# Patient Record
Sex: Female | Born: 1955 | Race: Black or African American | Hispanic: No | Marital: Married | Smoking: Never smoker
Health system: Southern US, Community
[De-identification: ages and names within clinical notes are randomized; demographics above are authoritative.]

## PROBLEM LIST (undated history)

## (undated) DIAGNOSIS — Z860100 Personal history of colon polyps, unspecified: Secondary | ICD-10-CM

## (undated) DIAGNOSIS — E669 Obesity, unspecified: Secondary | ICD-10-CM

## (undated) DIAGNOSIS — E559 Vitamin D deficiency, unspecified: Secondary | ICD-10-CM

## (undated) HISTORY — DX: Personal history of colon polyps, unspecified: Z86.0100

## (undated) HISTORY — PX: CHOLECYSTECTOMY: SHX55

## (undated) HISTORY — DX: Personal history of colonic polyps: Z86.010

## (undated) HISTORY — PX: WISDOM TOOTH EXTRACTION: SHX21

---

## 2005-02-12 ENCOUNTER — Other Ambulatory Visit: Payer: Self-pay

## 2005-02-12 ENCOUNTER — Emergency Department: Payer: Self-pay | Admitting: Emergency Medicine

## 2009-02-27 ENCOUNTER — Ambulatory Visit: Payer: Self-pay | Admitting: General Practice

## 2010-05-23 ENCOUNTER — Ambulatory Visit: Payer: Self-pay | Admitting: Gastroenterology

## 2010-05-28 LAB — PATHOLOGY REPORT

## 2011-05-19 ENCOUNTER — Ambulatory Visit: Payer: Self-pay | Admitting: General Practice

## 2011-07-28 HISTORY — PX: BARIATRIC SURGERY: SHX1103

## 2011-09-15 ENCOUNTER — Ambulatory Visit: Payer: Self-pay | Admitting: Specialist

## 2011-10-27 ENCOUNTER — Ambulatory Visit: Payer: Self-pay | Admitting: Specialist

## 2011-10-30 ENCOUNTER — Ambulatory Visit: Payer: Self-pay | Admitting: Specialist

## 2011-11-25 ENCOUNTER — Ambulatory Visit: Payer: Self-pay | Admitting: Specialist

## 2011-12-28 ENCOUNTER — Inpatient Hospital Stay: Payer: Self-pay | Admitting: Specialist

## 2011-12-29 LAB — CBC WITH DIFFERENTIAL/PLATELET
Basophil #: 0 10*3/uL (ref 0.0–0.1)
Basophil %: 0.1 %
HCT: 35.1 % (ref 35.0–47.0)
HGB: 11.6 g/dL — ABNORMAL LOW (ref 12.0–16.0)
Lymphocyte #: 0.7 10*3/uL — ABNORMAL LOW (ref 1.0–3.6)
MCH: 30.1 pg (ref 26.0–34.0)
Monocyte #: 0.6 x10 3/mm (ref 0.2–0.9)
Monocyte %: 6.2 %
Neutrophil #: 7.8 10*3/uL — ABNORMAL HIGH (ref 1.4–6.5)
Neutrophil %: 86.5 %
Platelet: 179 10*3/uL (ref 150–440)
RBC: 3.86 10*6/uL (ref 3.80–5.20)
WBC: 9 10*3/uL (ref 3.6–11.0)

## 2011-12-29 LAB — BASIC METABOLIC PANEL
Anion Gap: 9 (ref 7–16)
BUN: 11 mg/dL (ref 7–18)
Chloride: 108 mmol/L — ABNORMAL HIGH (ref 98–107)
Co2: 24 mmol/L (ref 21–32)
Creatinine: 0.81 mg/dL (ref 0.60–1.30)
EGFR (African American): >60
EGFR (Non-African Amer.): >60
Glucose: 103 mg/dL — ABNORMAL HIGH (ref 65–99)
Potassium: 4.3 mmol/L (ref 3.5–5.1)

## 2011-12-29 LAB — MAGNESIUM: Magnesium: 1.7 mg/dL — ABNORMAL LOW

## 2011-12-29 LAB — ALBUMIN: Albumin: 3.3 g/dL — ABNORMAL LOW (ref 3.4–5.0)

## 2012-01-19 ENCOUNTER — Ambulatory Visit: Payer: Self-pay | Admitting: Specialist

## 2012-01-25 ENCOUNTER — Ambulatory Visit: Payer: Self-pay | Admitting: Specialist

## 2012-12-13 ENCOUNTER — Ambulatory Visit: Payer: Self-pay | Admitting: Gastroenterology

## 2013-04-05 ENCOUNTER — Ambulatory Visit: Payer: Self-pay | Admitting: General Practice

## 2013-04-18 ENCOUNTER — Ambulatory Visit: Payer: Self-pay | Admitting: Oncology

## 2013-04-18 LAB — CBC CANCER CENTER
Basophil #: 0 x10 3/mm (ref 0.0–0.1)
Eosinophil #: 0.2 x10 3/mm (ref 0.0–0.7)
Eosinophil %: 3 %
HCT: 40.5 % (ref 35.0–47.0)
HGB: 13.4 g/dL (ref 12.0–16.0)
Lymphocyte #: 1.7 x10 3/mm (ref 1.0–3.6)
Lymphocyte %: 28.5 %
MCH: 30 pg (ref 26.0–34.0)
MCHC: 33 g/dL (ref 32.0–36.0)
MCV: 91 fL (ref 80–100)
Monocyte #: 0.4 x10 3/mm (ref 0.2–0.9)
Monocyte %: 5.9 %
Neutrophil #: 3.8 x10 3/mm (ref 1.4–6.5)
Neutrophil %: 62.1 %
Platelet: 204 x10 3/mm (ref 150–440)
RBC: 4.46 10*6/uL (ref 3.80–5.20)
RDW: 13.1 % (ref 11.5–14.5)

## 2013-04-18 LAB — IRON AND TIBC
Iron Bind.Cap.(Total): 290 ug/dL (ref 250–450)
Iron Saturation: 40 %
Unbound Iron-Bind.Cap.: 173 ug/dL

## 2013-04-18 LAB — FERRITIN: Ferritin (ARMC): 249 ng/mL (ref 8–388)

## 2013-04-24 ENCOUNTER — Ambulatory Visit: Payer: Self-pay | Admitting: General Practice

## 2013-04-26 ENCOUNTER — Ambulatory Visit: Payer: Self-pay | Admitting: Oncology

## 2013-06-12 ENCOUNTER — Ambulatory Visit: Payer: Self-pay | Admitting: Gastroenterology

## 2014-08-21 ENCOUNTER — Ambulatory Visit: Payer: Self-pay | Admitting: Podiatry

## 2014-11-18 NOTE — Op Note (Signed)
PATIENT NAME:  Sandra Mendez, Sandra Mendez MR#:  [id number] DATE OF BIRTH:  [birth year]  DATE OF PROCEDURE:  12/28/2011  PREOPERATIVE DIAGNOSIS: Previous    POSTOPERATIVE DIAGNOSIS:    PROCEDURES:   1. 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 2. Extensive lysis of adhesions.  SURGEON:  Kreg Shropshire, MD  ASSISTANT:  Lorenda Cahill   CLINICAL HISTORY:  See History and Physical.  DETAILS OF THE PROCEDURE:  The patient was taken to the operating room and placed on the operating table in the supine position.  Appropriate monitors and supplemental oxygen were delivered.  Broad-spectrum IV antibiotics were administered.  Sequential stockings were placed.  The abdomen was prepped and draped in the usual sterile fashion after the smooth induction of general anesthesia.  The abdomen was accessed using a 5-ml optical trocar in the left upper quadrant.  Pneumoperitoneum was established without difficulty.  Multiple other ports were placed in preparation for gastric bypass.  The small bowel was identified at the ligament of Treitz and run for 50 cm distal to that point.  It was then divided using an Echelon white load stapler reinforced with seam guard.  The Harmonic scalpel was used to take down the mesentery to its base.  A 100 cm (Not Dictated) cm Roux limb was then created and a side-to-side jejunojejunostomy was created in the following fashion.  A tacking stitch was used using 2-0 Surgidac suture.  An enterotomy was made in both limbs and the Echelon white load stapler was inserted to its hub, clamped, and fired for its full 6-cm length.  The ensuing defect was closed using interrupted sutures to retract the enterotomy up and stapled across with the Echelon blue-load stapler with good effect.  A baby's butt stitch was placed times one using 2-0 Surgidac suture.  A stay suture was placed anteriorly using a 2-0 Surgidac suture and the mesenteric defect was closed using running 2-0 Surgidac suture.  The omentum was divided then in  the midline to create a path for the Roux limb to be lifted and elevated up to the gastric pouch.  The liver retractor was placed and the liver was retracted anteriorly and cephalad.  The patient was placed in steep reverse Trendelenburg after the Roux limb was tacked to the underside of the stomach using a 2-0 Surgidac suture.  All structures were removed from the stomach.  The pars flaccida was taken down using a Harmonic scalpel to the second gastric vein.  At that point the Echelon gold load reinforced with seam guard was used to fire multiple times creating a small 30 to 45-ml pouch.  Hemostasis was excellent.  The posterior pouch was then freed of all fibrofatty tissue in preparation for anastomosis.  The small bowel Roux limb was then mobilized up into the upper abdomen and tacked to the left side of the pouch with an interrupted 2-0 Surgidac suture.  Enterotomies were made in the gastric pouch and in the small bowel and the Echelon blue load stapler was inserted to 28 mm and clamped.  This was then fired and the ensuing defect was closed using a layer of 2-0 Polysorb suture times one.  A 34 French blunt-tipped bougie was inserted trans-orally down past the anastomosis and the second layer of closure occurred with two running 2-0 Polysorb sutures from either side, creating a two-layer anastomosis.  The bougie was removed and the proximal Roux limb was clamped.  This was submerged underneath saline and endoscope was placed trans-orally and guided all the way  down to the anastomosis without difficulty.  The anastomosis was widely patent and hemostasis was excellent.  No leak was present under high-pressure insufflation with the proximal Roux limb clamp.  The endoscopes were removed and I re-gowned and re-gloved.  We took the omentum and draped that over the top of the anastomosis and sutured it in place using a 2-0 Surgidac suture.  The Peterson's defect was then closed using running 2-0 Surgidac suture. A  drain was placed in the left upper quadrant over the anastomosis of [place].  The liver retractor and trocars were removed without incident.  No bleeding occurred from any of these sites.  The wounds were then closed using 4-0 Vicryl and Dermabond.  The patient tolerated the procedure well, arrived in the recovery room in stable condition, and will be admitted to my care.  ADDENDUM: Over one hour was spent lysing adhesions primarily omentum to the abdominal wall. This was a very difficult case from that standpoint but otherwise everything went fine.   ____________________________ Kreg Shropshire, MD jb:drc D: 01/07/2012 16:29:25 ET T: 01/08/2012 07:07:59 ET JOB#: [id number]  cc: Kreg Shropshire, MD, <Dictator> Wille Glaser Langley Adie MD ELECTRONICALLY SIGNED 01/12/2012 14:39

## 2015-02-15 ENCOUNTER — Ambulatory Visit
Admission: RE | Admit: 2015-02-15 | Discharge: 2015-02-15 | Disposition: A | Discharge status: Home / Self Care | Payer: PRIVATE HEALTH INSURANCE | Source: Ambulatory Visit | Attending: Family | Admitting: Family

## 2015-02-15 ENCOUNTER — Other Ambulatory Visit: Payer: Self-pay | Admitting: Family

## 2015-02-15 DIAGNOSIS — M25512 Pain in left shoulder: Secondary | ICD-10-CM

## 2015-05-29 ENCOUNTER — Other Ambulatory Visit: Payer: Self-pay

## 2015-06-14 ENCOUNTER — Ambulatory Visit: Payer: Self-pay | Admitting: Family Medicine

## 2015-07-04 ENCOUNTER — Ambulatory Visit: Payer: Self-pay | Admitting: Family Medicine

## 2015-08-15 ENCOUNTER — Ambulatory Visit: Payer: Self-pay | Admitting: Physician Assistant

## 2015-08-16 ENCOUNTER — Ambulatory Visit: Payer: Self-pay | Admitting: Physician Assistant

## 2015-08-16 ENCOUNTER — Encounter: Payer: Self-pay | Admitting: Physician Assistant

## 2015-08-16 ENCOUNTER — Other Ambulatory Visit: Payer: Self-pay | Admitting: Physician Assistant

## 2015-08-16 VITALS — BP 119/70 | HR 73 | Temp 98.3°F

## 2015-08-16 DIAGNOSIS — J208 Acute bronchitis due to other specified organisms: Secondary | ICD-10-CM

## 2015-08-16 MED ORDER — PSEUDOEPH-BROMPHEN-DM 30-2-10 MG/5ML PO SYRP: 5.0000 mL | ORAL_SOLUTION | Freq: Four times a day (QID) | ORAL | Status: DC | PRN

## 2015-08-16 MED ORDER — SULFAMETHOXAZOLE-TRIMETHOPRIM 800-160 MG PO TABS: 1.0000 | ORAL_TABLET | Freq: Two times a day (BID) | ORAL | Status: DC

## 2015-08-16 NOTE — Progress Notes (Signed)
S-  Patient c/o productive cough for one week. Last week developed viral URI consisting of nasal congestion, body ache, and chills. Last two days developed greenish productive cough. O-  No acute distress.  HEENT revealed nasal congestion, post nasal drainage, and productive cough.  Neck supple, no adenopathy, Lungs with bilateral Rales, Heart RRR. A- Bronchitis P- Bactrim DS and Bromfed DM. F/U PRN.

## 2015-08-20 DIAGNOSIS — Z86010 Personal history of colonic polyps: Secondary | ICD-10-CM | POA: Insufficient documentation

## 2015-08-28 LAB — FECAL OCCULT BLOOD, GUAIAC: FECAL OCCULT BLD: NEGATIVE

## 2015-10-08 ENCOUNTER — Ambulatory Visit: Payer: Self-pay | Admitting: Physician Assistant

## 2015-10-08 ENCOUNTER — Encounter: Payer: Self-pay | Admitting: Physician Assistant

## 2015-10-08 VITALS — BP 106/70 | HR 68 | Temp 98.4°F

## 2015-10-08 DIAGNOSIS — H538 Other visual disturbances: Secondary | ICD-10-CM

## 2015-10-08 NOTE — Progress Notes (Signed)
S: pt states she has had floaters in both eyes for awhile, now it seems they are in cloudy balls and clustered, vision seems cloudy, no pain, no headache, no dif with blood pressure  O: vitals wnl, nad, perrl eomi, no redness or drainage, peripheral vision intact, nv intact  A: cloudy vision  P: refer to opth

## 2016-01-14 ENCOUNTER — Other Ambulatory Visit: Payer: Self-pay | Admitting: Emergency Medicine

## 2016-01-14 MED ORDER — VITAMIN D (ERGOCALCIFEROL) 1.25 MG (50000 UNIT) PO CAPS: 50000.0000 [IU] | ORAL_CAPSULE | ORAL | Status: DC

## 2016-01-14 NOTE — Telephone Encounter (Signed)
Med refill approved, hx of vit d deficiency

## 2016-01-17 ENCOUNTER — Ambulatory Visit (INDEPENDENT_AMBULATORY_CARE_PROVIDER_SITE_OTHER): Payer: Managed Care, Other (non HMO) | Admitting: Family Medicine

## 2016-01-17 ENCOUNTER — Encounter: Payer: Self-pay | Admitting: Family Medicine

## 2016-01-17 VITALS — BP 121/81 | HR 62 | Temp 99.1°F | Ht 64.0 in | Wt 233.0 lb

## 2016-01-17 DIAGNOSIS — R635 Abnormal weight gain: Secondary | ICD-10-CM | POA: Diagnosis not present

## 2016-01-17 DIAGNOSIS — IMO0002 Reserved for concepts with insufficient information to code with codable children: Secondary | ICD-10-CM

## 2016-01-17 DIAGNOSIS — Z7189 Other specified counseling: Secondary | ICD-10-CM

## 2016-01-17 DIAGNOSIS — Z0000 Encounter for general adult medical examination without abnormal findings: Secondary | ICD-10-CM | POA: Diagnosis not present

## 2016-01-17 DIAGNOSIS — E559 Vitamin D deficiency, unspecified: Secondary | ICD-10-CM | POA: Diagnosis not present

## 2016-01-17 NOTE — Patient Instructions (Signed)
Immunization Information for Foreign Travel Immunizations can protect you from certain diseases. Immunizations can also prevent the spread of certain infections. It is important to see your caregiver or a travel medicine specialist 4-6 weeks before you travel. This allows time for vaccines to take effect. It also provides enough time for you to get vaccines that must be given in a series over a period of days or weeks. Immunizations for travelers include:  Routine vaccines. These vaccines are standard for the people in a country.  Recommended vaccines. These vaccines are recommended before travel to some countries or regions.  Required vaccines. These vaccines are necessary before travel to specific countries or regions. If it is less than 4 weeks before you leave, you should still see your caregiver. You might still benefit from vaccines or medicines. WHAT ARE THE ROUTINE VACCINES? Routine vaccines can protect you from diseases that are common in many parts of the world. Most routine vaccines are given at specific ages during your life. However, routine vaccines also include the annual flu (influenza) vaccine. You should be up to date on your routine immunizations before you travel. Your caregiver will be able to review your vaccine history and determine whether you have had all the routine vaccines. You may be advised to get extra doses or booster vaccines even if you are up to date on the routine vaccines. WHAT ARE THE RECOMMENDED VACCINES? Know your travel schedule when you visit your caregiver. The vaccines recommended before foreign travel will depend on several factors, including:  The country or countries of travel.  Whether you will travel to rural areas.  The length of time you will be traveling.  The season of the year.  Your age.  Your health status.  Your previous immunizations. Vaccine recommendations change over time. Your caregiver can tell you what vaccines are recommended  before your trip. The annual influenza vaccine sometimes differs for the [place] and [place] hemispheres. Unless the annual vaccines are the same in both hemispheres, people with certain chronic medical conditions who are traveling to the other hemisphere shortly before or during the influenza season should also get the other influenza vaccine. The other influenza vaccine should be obtained either before leaving the country or shortly after arrival at the travel site. WHAT ARE THE REQUIRED VACCINES? Vaccines may be required during a current outbreak of an infectious disease in a country or region. Your caregiver will be able to tell you about any current outbreaks and required vaccines. For example, proof of yellow fever immunization is currently required for most people before traveling to certain countries in [place] and [place]. This vaccine can only be obtained at approved centers. You should get the yellow fever vaccine at least 10 days before your trip. After 10 days, most people show immunity to yellow fever. If it has been longer than 10 years since you received the yellow fever vaccine, another dose is required. If proof of immunization is incomplete or inaccurate, you could be quarantined, denied entry, or given another dose of vaccine at the travel site. If you cannot receive the yellow fever vaccine because of medical reasons, you must have a written statement from your caregiver. The statement must contain a medical reason for the lack of immunization. In such a case, your caregiver should then give you advice on how to decrease your chance of getting yellow fever. That advice should include taking precautions to avoid mosquito bites and limiting outdoor time. Other than having a medical condition  or being under the age of 49 months, no other reasons will be accepted for not getting the vaccine.  Proof of meningococcal immunization is required by the [place] for any  person older than 2 years who is taking part in the [place] or [place]. Visas for traveling to the [place] or [place] will not even be issued until there is proof of immunization. You should get this vaccine at least 10 days before your trip. After 10 days, most people show immunity. If it has been longer than 3 years since your last immunization, another dose is required. FOR MORE INFORMATION  Centers for Disease Control and Prevention (CDC): http://www.wolf.info/  World Health Organization [facility]): RoleLink.com.br   This information is not intended to replace advice given to you by your health care provider. Make sure you discuss any questions you have with your health care provider.   Document Released: 07/01/2009 Document Revised: 08/03/2014 Document Reviewed: 06/10/2012 Elsevier Interactive Patient Education [employer].

## 2016-01-17 NOTE — Progress Notes (Signed)
BP 121/81 mmHg  Pulse 62  Temp(Src) 99.1 F (37.3 C)  Ht 5\' 4"  (1.626 m)  Wt 233 lb (105.688 kg)  BMI 39.97 kg/m2  SpO2 99%   Subjective:    Patient ID: Sandra Mendez, female    DOB: July 30, 1955, 60 y.o.   MRN: [id number]  HPI: Sandra Mendez is a 60 y.o. female  Chief Complaint  Patient presents with  . Establish Care   WEIGHT GAIN Duration: 2-3 months  Previous attempts at weight loss: yes Complications of obesity: Now Peak weight: 60 Weight loss goal: 145-150 (starting 200)  Weight loss to date: 12 Requesting obesity pharmacotherapy: no Current weight loss supplements/medications: no Previous weight loss supplements/meds: no Has been walking 3 miles a day Diet has not been good recently  Traveling to [place] in December and [place] in the Spring. Will need vaccines. Unsure if she has had her hepatitis vaccines in the past or not- did work at the hospital in the past.   Relevant past medical, surgical, family and social history reviewed and updated as indicated. Interim medical history since our last visit reviewed. Allergies and medications reviewed and updated.  Review of Systems  Constitutional: Negative.   Respiratory: Negative.   Cardiovascular: Negative.   Gastrointestinal: Negative.   Musculoskeletal: Negative.   Psychiatric/Behavioral: Negative.    Per HPI unless specifically indicated above     Objective:    BP 121/81 mmHg  Pulse 62  Temp(Src) 99.1 F (37.3 C)  Ht 5\' 4"  (1.626 m)  Wt 233 lb (105.688 kg)  BMI 39.97 kg/m2  SpO2 99%  Wt Readings from Last 3 Encounters:  01/17/16 233 lb (105.688 kg)    Physical Exam  Constitutional: She is oriented to person, place, and time. She appears well-developed and well-nourished. No distress.  HENT:  Head: Normocephalic and atraumatic.  Right Ear: Hearing normal.  Left Ear: Hearing normal.  Nose: Nose normal.  Eyes: Conjunctivae and lids are normal. Right eye exhibits no discharge. Left eye  exhibits no discharge. No scleral icterus.  Cardiovascular: Normal rate, regular rhythm, normal heart sounds and intact distal pulses.  Exam reveals no gallop and no friction rub.   No murmur heard. Pulmonary/Chest: Effort normal and breath sounds normal. No respiratory distress. She has no wheezes. She has no rales. She exhibits no tenderness.  Musculoskeletal: Normal range of motion.  Neurological: She is alert and oriented to person, place, and time.  Skin: Skin is warm, dry and intact. No rash noted. No erythema. No pallor.  Psychiatric: She has a normal mood and affect. Her speech is normal and behavior is normal. Judgment and thought content normal. Cognition and memory are normal.  Nursing note and vitals reviewed.      Assessment & Plan:   Problem List Items Addressed This Visit    None    Visit Diagnoses    Weight gain    -  Primary    Will check labs to look for organic cause. Likely due to over eating. Work on cutting back. Await results.     Relevant Orders    CBC with Differential/Platelet    Comprehensive metabolic panel    Lipid Panel w/o Chol/HDL Ratio    TSH    UA/M w/rflx Culture, Routine    Hepatitis, Acute    Hepatitis B surface antibody    VITAMIN D 25 Hydroxy (Vit-D Deficiency, Fractures)    Hgb A1c w/o eAG    Vitamin D deficiency  Will recheck her levels and replace as needed.     Relevant Orders    VITAMIN D 25 Hydroxy (Vit-D Deficiency, Fractures)    Advice or immunization for travel        Will need vaccines for travel to [place]. Referral given today. Checking on hepatitis status with labs.    Relevant Orders    Hepatitis, Acute    Hepatitis B surface antibody    Ambulatory referral to [facility]    Routine general medical examination at a health care facility        Will check labs prior to physical. Physical to be done next visit.     Relevant Orders    CBC with Differential/Platelet    Comprehensive metabolic panel    Lipid Panel w/o  Chol/HDL Ratio    TSH    UA/M w/rflx Culture, Routine    Hepatitis, Acute    Hepatitis B surface antibody    VITAMIN D 25 Hydroxy (Vit-D Deficiency, Fractures)    Hgb A1c w/o eAG        Follow up plan: Return ASAP PE.

## 2016-01-20 ENCOUNTER — Other Ambulatory Visit: Payer: Self-pay

## 2016-01-20 DIAGNOSIS — Z299 Encounter for prophylactic measures, unspecified: Secondary | ICD-10-CM

## 2016-01-20 NOTE — Progress Notes (Signed)
Patient came in to have blood drawn for testing per Dr. Jinny Blossom Johnson's orders.  Blood was drawn from the right arm without any incident.

## 2016-01-20 NOTE — Addendum Note (Signed)
Addended by: Rudene Anda T on: 01/20/2016 12:34 PM   Modules accepted: Orders

## 2016-01-21 LAB — CMP12+LP+TP+TSH+6AC+CBC/D/PLT
ALBUMIN: 4 g/dL (ref 3.6–4.8)
ALT: 21 IU/L (ref 0–32)
AST: 25 IU/L (ref 0–40)
Albumin/Globulin Ratio: 1.7 (ref 1.2–2.2)
Alkaline Phosphatase: 84 IU/L (ref 39–117)
BASOS ABS: 0 10*3/uL (ref 0.0–0.2)
BASOS: 1 %
BUN/Creatinine Ratio: 18 (ref 12–28)
BUN: 15 mg/dL (ref 8–27)
Bilirubin Total: 0.3 mg/dL (ref 0.0–1.2)
CALCIUM: 8.4 mg/dL — AB (ref 8.7–10.3)
CHLORIDE: 105 mmol/L (ref 96–106)
CHOLESTEROL TOTAL: 247 mg/dL — AB (ref 100–199)
Chol/HDL Ratio: 3.3 ratio units (ref 0.0–4.4)
Creatinine, Ser: 0.85 mg/dL (ref 0.57–1.00)
EOS (ABSOLUTE): 0.1 10*3/uL (ref 0.0–0.4)
Eos: 3 %
FREE THYROXINE INDEX: 1.6 (ref 1.2–4.9)
GFR calc Af Amer: 85 mL/min/{1.73_m2} (ref >59)
GFR calc non Af Amer: 74 mL/min/{1.73_m2} (ref >59)
GGT: 20 IU/L (ref 0–60)
Globulin, Total: 2.3 g/dL (ref 1.5–4.5)
Glucose: 94 mg/dL (ref 65–99)
HDL: 74 mg/dL (ref >39)
Hematocrit: 38.1 % (ref 34.0–46.6)
Hemoglobin: 12.6 g/dL (ref 11.1–15.9)
IMMATURE GRANULOCYTES: 0 %
IRON: 113 ug/dL (ref 27–139)
Immature Grans (Abs): 0 10*3/uL (ref 0.0–0.1)
LDH: 207 IU/L (ref 119–226)
LDL Calculated: 156 mg/dL — ABNORMAL HIGH (ref 0–99)
LYMPHS ABS: 1.4 10*3/uL (ref 0.7–3.1)
Lymphs: 37 %
MCH: 30.2 pg (ref 26.6–33.0)
MCHC: 33.1 g/dL (ref 31.5–35.7)
MCV: 91 fL (ref 79–97)
MONOS ABS: 0.2 10*3/uL (ref 0.1–0.9)
Monocytes: 6 %
NEUTROS PCT: 53 %
Neutrophils Absolute: 2 10*3/uL (ref 1.4–7.0)
PLATELETS: 192 10*3/uL (ref 150–379)
Phosphorus: 3.3 mg/dL (ref 2.5–4.5)
Potassium: 4 mmol/L (ref 3.5–5.2)
RBC: 4.17 x10E6/uL (ref 3.77–5.28)
RDW: 14.2 % (ref 12.3–15.4)
SODIUM: 145 mmol/L — AB (ref 134–144)
T3 UPTAKE RATIO: 26 % (ref 24–39)
T4 TOTAL: 6.1 ug/dL (ref 4.5–12.0)
TOTAL PROTEIN: 6.3 g/dL (ref 6.0–8.5)
TSH: 3.13 u[IU]/mL (ref 0.450–4.500)
Triglycerides: 87 mg/dL (ref 0–149)
Uric Acid: 5.9 mg/dL (ref 2.5–7.1)
VLDL Cholesterol Cal: 17 mg/dL (ref 5–40)
WBC: 3.8 10*3/uL (ref 3.4–10.8)

## 2016-01-21 LAB — HEPATITIS PANEL, ACUTE
HEP B S AG: NEGATIVE
Hep A IgM: NEGATIVE
Hep B C IgM: NEGATIVE
Hep C Virus Ab: 0.9 s/co ratio (ref 0.0–0.9)

## 2016-01-21 LAB — MICROSCOPIC EXAMINATION
BACTERIA UA: NONE SEEN
Casts: NONE SEEN /lpf

## 2016-01-21 LAB — UA/M W/RFLX CULTURE, ROUTINE
BILIRUBIN UA: NEGATIVE
GLUCOSE, UA: NEGATIVE
Ketones, UA: NEGATIVE
Leukocytes, UA: NEGATIVE
Nitrite, UA: NEGATIVE
PROTEIN UA: NEGATIVE
RBC, UA: NEGATIVE
SPEC GRAV UA: 1.026 (ref 1.005–1.030)
UUROB: 0.2 mg/dL (ref 0.2–1.0)
pH, UA: 6 (ref 5.0–7.5)

## 2016-01-21 LAB — VITAMIN D 25 HYDROXY (VIT D DEFICIENCY, FRACTURES): Vit D, 25-Hydroxy: 20.2 ng/mL — ABNORMAL LOW (ref 30.0–100.0)

## 2016-01-21 LAB — HGB A1C W/O EAG: Hgb A1c MFr Bld: 5.7 % — ABNORMAL HIGH (ref 4.8–5.6)

## 2016-01-21 LAB — HEPATITIS B SURFACE ANTIBODY,QUALITATIVE: Hep B Surface Ab, Qual: NONREACTIVE

## 2016-01-22 ENCOUNTER — Other Ambulatory Visit: Payer: Self-pay | Admitting: Family Medicine

## 2016-01-22 ENCOUNTER — Encounter: Payer: Self-pay | Admitting: Family Medicine

## 2016-01-22 DIAGNOSIS — E785 Hyperlipidemia, unspecified: Secondary | ICD-10-CM | POA: Insufficient documentation

## 2016-01-22 DIAGNOSIS — Z23 Encounter for immunization: Secondary | ICD-10-CM | POA: Insufficient documentation

## 2016-01-22 DIAGNOSIS — E559 Vitamin D deficiency, unspecified: Secondary | ICD-10-CM | POA: Insufficient documentation

## 2016-01-30 ENCOUNTER — Encounter: Payer: Self-pay | Admitting: Physician Assistant

## 2016-01-30 ENCOUNTER — Ambulatory Visit: Payer: Self-pay | Admitting: Physician Assistant

## 2016-01-30 VITALS — BP 120/70 | HR 70 | Temp 98.5°F

## 2016-01-30 DIAGNOSIS — M791 Myalgia, unspecified site: Secondary | ICD-10-CM

## 2016-01-30 NOTE — Progress Notes (Signed)
S: c/o occasional cramps in upper thighs after walking 3 miles, only happened 2-3 times over the last month, no swelling or redness in legs, states she has been eating a lot of salty chips lately, no otc vitamins  O: vitals wnl, nad, thighs wnl, no redness, swelling, tenderness, n/v intact  A: muscle pain  P: stretch after walking, otc turmeric, return if worsening

## 2016-02-13 ENCOUNTER — Encounter: Payer: Self-pay | Admitting: Emergency Medicine

## 2016-02-13 ENCOUNTER — Emergency Department: Payer: Managed Care, Other (non HMO)

## 2016-02-13 ENCOUNTER — Emergency Department
Admission: EM | Admit: 2016-02-13 | Discharge: 2016-02-13 | Disposition: A | Discharge status: Home / Self Care | Payer: Managed Care, Other (non HMO) | Attending: Emergency Medicine | Admitting: Emergency Medicine

## 2016-02-13 DIAGNOSIS — T148XXA Other injury of unspecified body region, initial encounter: Secondary | ICD-10-CM

## 2016-02-13 DIAGNOSIS — Y999 Unspecified external cause status: Secondary | ICD-10-CM | POA: Diagnosis not present

## 2016-02-13 DIAGNOSIS — Y9389 Activity, other specified: Secondary | ICD-10-CM | POA: Diagnosis not present

## 2016-02-13 DIAGNOSIS — M25572 Pain in left ankle and joints of left foot: Secondary | ICD-10-CM | POA: Diagnosis present

## 2016-02-13 DIAGNOSIS — Y92410 Unspecified street and highway as the place of occurrence of the external cause: Secondary | ICD-10-CM | POA: Insufficient documentation

## 2016-02-13 DIAGNOSIS — S93402A Sprain of unspecified ligament of left ankle, initial encounter: Secondary | ICD-10-CM | POA: Insufficient documentation

## 2016-02-13 DIAGNOSIS — G8911 Acute pain due to trauma: Secondary | ICD-10-CM

## 2016-02-13 MED ORDER — DIAZEPAM 2 MG PO TABS: 2.0000 mg | ORAL_TABLET | Freq: Three times a day (TID) | ORAL | Status: DC | PRN

## 2016-02-13 MED ORDER — CYCLOBENZAPRINE HCL 10 MG PO TABS
5.0000 mg | ORAL_TABLET | Freq: Once | ORAL | Status: AC
  Administered 2016-02-13: 5 mg via ORAL
  Filled 2016-02-13: qty 1

## 2016-02-13 MED ORDER — NAPROXEN 500 MG PO TABS
500.0000 mg | ORAL_TABLET | Freq: Once | ORAL | Status: AC
  Administered 2016-02-13: 500 mg via ORAL
  Filled 2016-02-13: qty 1

## 2016-02-13 MED ORDER — NAPROXEN 500 MG PO TABS: 500.0000 mg | ORAL_TABLET | Freq: Two times a day (BID) | ORAL | Status: DC
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
## 2016-02-18 ENCOUNTER — Encounter: Payer: Self-pay | Admitting: Family Medicine

## 2016-02-18 ENCOUNTER — Ambulatory Visit (INDEPENDENT_AMBULATORY_CARE_PROVIDER_SITE_OTHER): Payer: Managed Care, Other (non HMO) | Admitting: Family Medicine

## 2016-02-18 VITALS — BP 134/83 | Temp 98.7°F | Ht 65.2 in | Wt 233.0 lb

## 2016-02-18 DIAGNOSIS — Z043 Encounter for examination and observation following other accident: Secondary | ICD-10-CM | POA: Diagnosis not present

## 2016-02-18 DIAGNOSIS — Z041 Encounter for examination and observation following transport accident: Secondary | ICD-10-CM

## 2016-02-18 MED ORDER — DIAZEPAM 2 MG PO TABS: 2.0000 mg | ORAL_TABLET | Freq: Every evening | ORAL | 0 refills | Status: DC | PRN

## 2016-02-18 MED ORDER — NAPROXEN 500 MG PO TABS: 500.0000 mg | ORAL_TABLET | Freq: Two times a day (BID) | ORAL | 0 refills | Status: DC

## 2016-02-18 MED ORDER — CYCLOBENZAPRINE HCL 10 MG PO TABS: 10.0000 mg | ORAL_TABLET | Freq: Three times a day (TID) | ORAL | 0 refills | Status: DC | PRN
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
## 2016-02-18 NOTE — Patient Instructions (Addendum)
Continue naproxen 2x a day Stop the diazepam (strong muscle relaxer) during the day- only at night now Start the cyclobenzaprine (muscle relaxer) during the day as needed (don't take it with the diazepam at night or you'll be too too sleepy)  Chest Contusion A chest contusion is a deep bruise on your chest area. Contusions are the result of an injury that caused bleeding under the skin. A chest contusion may involve bruising of the skin, muscles, or ribs. The contusion may turn blue, purple, or yellow. Minor injuries will give you a painless contusion, but more severe contusions may stay painful and swollen for a few weeks. CAUSES  A contusion is usually caused by a blow, trauma, or direct force to an area of the body. SYMPTOMS   Swelling and redness of the injured area.  Discoloration of the injured area.  Tenderness and soreness of the injured area.  Pain. DIAGNOSIS  The diagnosis can be made by taking a history and performing a physical exam. An X-ray, CT scan, or MRI may be needed to determine if there were any associated injuries, such as broken bones (fractures) or internal injuries. TREATMENT  Often, the best treatment for a chest contusion is resting, icing, and applying cold compresses to the injured area. Deep breathing exercises may be recommended to reduce the risk of pneumonia. Over-the-counter medicines may also be recommended for pain control. HOME CARE INSTRUCTIONS   Put ice on the injured area.  Put ice in a plastic bag.  Place a towel between your skin and the bag.  Leave the ice on for 15-20 minutes, 03-04 times a day.  Only take over-the-counter or prescription medicines as directed by your caregiver. Your caregiver may recommend avoiding anti-inflammatory medicines (aspirin, ibuprofen, and naproxen) for 48 hours because these medicines may increase bruising.  Rest the injured area.  Perform deep-breathing exercises as directed by your caregiver.  Stop smoking  if you smoke.  Do not lift objects over 5 pounds (2.3 kg) for 3 days or longer if recommended by your caregiver. SEEK IMMEDIATE MEDICAL CARE IF:   You have increased bruising or swelling.  You have pain that is getting worse.  You have difficulty breathing.  You have dizziness, weakness, or fainting.  You have blood in your urine or stool.  You cough up or vomit blood.  Your swelling or pain is not relieved with medicines. MAKE SURE YOU:   Understand these instructions.  Will watch your condition.  Will get help right away if you are not doing well or get worse.   This information is not intended to replace advice given to you by your health care provider. Make sure you discuss any questions you have with your health care provider.   Document Released: 04/07/2001 Document Revised: 04/06/2012 Document Reviewed: 01/04/2012 Elsevier Interactive Patient Education 2016 [place] A contusion is a deep bruise. Contusions are the result of a blunt injury to tissues and muscle fibers under the skin. The injury causes bleeding under the skin. The skin overlying the contusion may turn blue, purple, or yellow. Minor injuries will give you a painless contusion, but more severe contusions may stay painful and swollen for a few weeks.  CAUSES  This condition is usually caused by a blow, trauma, or direct force to an area of the body. SYMPTOMS  Symptoms of this condition include:  Swelling of the injured area.  Pain and tenderness in the injured area.  Discoloration. The area may have redness  and then turn blue, purple, or yellow. DIAGNOSIS  This condition is diagnosed based on a physical exam and medical history. An X-ray, CT scan, or MRI may be needed to determine if there are any associated injuries, such as broken bones (fractures). TREATMENT  Specific treatment for this condition depends on what area of the body was injured. In general, the best treatment for a  contusion is resting, icing, applying pressure to (compression), and elevating the injured area. This is often called the RICE strategy. Over-the-counter anti-inflammatory medicines may also be recommended for pain control.  HOME CARE INSTRUCTIONS   Rest the injured area.  If directed, apply ice to the injured area:  Put ice in a plastic bag.  Place a towel between your skin and the bag.  Leave the ice on for 20 minutes, 2-3 times per day.  If directed, apply light compression to the injured area using an elastic bandage. Make sure the bandage is not wrapped too tightly. Remove and reapply the bandage as directed by your health care provider.  If possible, raise (elevate) the injured area above the level of your heart while you are sitting or lying down.  Take over-the-counter and prescription medicines only as told by your health care provider. SEEK MEDICAL CARE IF:  Your symptoms do not improve after several days of treatment.  Your symptoms get worse.  You have difficulty moving the injured area. SEEK IMMEDIATE MEDICAL CARE IF:   You have severe pain.  You have numbness in a hand or foot.  Your hand or foot turns pale or cold.   This information is not intended to replace advice given to you by your health care provider. Make sure you discuss any questions you have with your health care provider.   Document Released: 04/22/2005 Document Revised: 04/03/2015 Document Reviewed: 11/28/2014 Elsevier Interactive Patient Education 2016 [employer]  Cervical Sprain A cervical sprain is an injury in the neck in which the strong, fibrous tissues (ligaments) that connect your neck bones stretch or tear. Cervical sprains can range from mild to severe. Severe cervical sprains can cause the neck vertebrae to be unstable. This can lead to damage of the spinal cord and can result in serious nervous system problems. The amount of time it takes for a cervical sprain to get better depends  on the cause and extent of the injury. Most cervical sprains heal in 1 to 3 weeks. CAUSES  Severe cervical sprains may be caused by:   Contact sport injuries (such as from football, rugby, wrestling, hockey, auto racing, gymnastics, diving, martial arts, or boxing).   Motor vehicle collisions.   Whiplash injuries. This is an injury from a sudden forward and backward whipping movement of the head and neck.  Falls.  Mild cervical sprains may be caused by:   Being in an awkward position, such as while cradling a telephone between your ear and shoulder.   Sitting in a chair that does not offer proper support.   Working at a poorly [employer] station.   Looking up or down for long periods of time.  SYMPTOMS   Pain, soreness, stiffness, or a burning sensation in the front, back, or sides of the neck. This discomfort may develop immediately after the injury or slowly, 24 hours or more after the injury.   Pain or tenderness directly in the middle of the back of the neck.   Shoulder or upper back pain.   Limited ability to move the neck.  Headache.   Dizziness.   Weakness, numbness, or tingling in the hands or arms.   Muscle spasms.   Difficulty swallowing or chewing.   Tenderness and swelling of the neck.  DIAGNOSIS  Most of the time your health care provider can diagnose a cervical sprain by taking your history and doing a physical exam. Your health care provider will ask about previous neck injuries and any known neck problems, such as arthritis in the neck. X-rays may be taken to find out if there are any other problems, such as with the bones of the neck. Other tests, such as a CT scan or MRI, may also be needed.  TREATMENT  Treatment depends on the severity of the cervical sprain. Mild sprains can be treated with rest, keeping the neck in place (immobilization), and pain medicines. Severe cervical sprains are immediately immobilized. Further  treatment is done to help with pain, muscle spasms, and other symptoms and may include:  Medicines, such as pain relievers, numbing medicines, or muscle relaxants.   Physical therapy. This may involve stretching exercises, strengthening exercises, and posture training. Exercises and improved posture can help stabilize the neck, strengthen muscles, and help stop symptoms from returning.  HOME CARE INSTRUCTIONS   Put ice on the injured area.   Put ice in a plastic bag.   Place a towel between your skin and the bag.   Leave the ice on for 15-20 minutes, 3-4 times a day.   If your injury was severe, you may have been given a cervical collar to wear. A cervical collar is a two-piece collar designed to keep your neck from moving while it heals.  Do not remove the collar unless instructed by your health care provider.  If you have long hair, keep it outside of the collar.  Ask your health care provider before making any adjustments to your collar. Minor adjustments may be required over time to improve comfort and reduce pressure on your chin or on the back of your head.  Ifyou are allowed to remove the collar for cleaning or bathing, follow your health care provider's instructions on how to do so safely.  Keep your collar clean by wiping it with mild soap and water and drying it completely. If the collar you have been given includes removable pads, remove them every 1-2 days and hand wash them with soap and water. Allow them to air dry. They should be completely dry before you wear them in the collar.  If you are allowed to remove the collar for cleaning and bathing, wash and dry the skin of your neck. Check your skin for irritation or sores. If you see any, tell your health care provider.  Do not drive while wearing the collar.   Only take over-the-counter or prescription medicines for pain, discomfort, or fever as directed by your health care provider.   Keep all follow-up  appointments as directed by your health care provider.   Keep all physical therapy appointments as directed by your health care provider.   Make any needed adjustments to your workstation to promote good posture.   Avoid positions and activities that make your symptoms worse.   Warm up and stretch before being active to help prevent problems.  SEEK MEDICAL CARE IF:   Your pain is not controlled with medicine.   You are unable to decrease your pain medicine over time as planned.   Your activity level is not improving as expected.  SEEK IMMEDIATE MEDICAL CARE IF:  You develop any bleeding.  You develop stomach upset.  You have signs of an allergic reaction to your medicine.   Your symptoms get worse.   You develop new, unexplained symptoms.   You have numbness, tingling, weakness, or paralysis in any part of your body.  MAKE SURE YOU:   Understand these instructions.  Will watch your condition.  Will get help right away if you are not doing well or get worse.   This information is not intended to replace advice given to you by your health care provider. Make sure you discuss any questions you have with your health care provider.   Document Released: 05/10/2007 Document Revised: 07/18/2013 Document Reviewed: 01/18/2013 Elsevier Interactive Patient Education 2016 [place] Strain With Rehab A strain is an injury in which a tendon or muscle is torn. The muscles and tendons of the lower back are vulnerable to strains. However, these muscles and tendons are very strong and require a great force to be injured. Strains are classified into three categories. Grade 1 strains cause pain, but the tendon is not lengthened. Grade 2 strains include a lengthened ligament, due to the ligament being stretched or partially ruptured. With grade 2 strains there is still function, although the function may be decreased. Grade 3 strains involve a complete tear of the  tendon or muscle, and function is usually impaired. SYMPTOMS   Pain in the lower back.  Pain that affects one side more than the other.  Pain that gets worse with movement and may be felt in the hip, buttocks, or back of the thigh.  Muscle spasms of the muscles in the back.  Swelling along the muscles of the back.  Loss of strength of the back muscles.  Crackling sound (crepitation) when the muscles are touched. CAUSES  Lower back strains occur when a force is placed on the muscles or tendons that is greater than they can handle. Common causes of injury include:  Prolonged overuse of the muscle-tendon units in the lower back, usually from incorrect posture.  A single violent injury or force applied to the back. RISK INCREASES WITH:  Sports that involve twisting forces on the spine or a lot of bending at the waist (football, rugby, weightlifting, bowling, golf, tennis, speed skating, racquetball, swimming, running, gymnastics, diving).  Poor strength and flexibility.  Failure to warm up properly before activity.  Family history of lower back pain or disk disorders.  Previous back injury or surgery (especially fusion).  Poor posture with lifting, especially heavy objects.  Prolonged sitting, especially with poor posture. PREVENTION   Learn and use proper posture when sitting or lifting (maintain proper posture when sitting, lift using the knees and legs, not at the waist).  Warm up and stretch properly before activity.  Allow for adequate recovery between workouts.  Maintain physical fitness:  Strength, flexibility, and endurance.  Cardiovascular fitness. PROGNOSIS  If treated properly, lower back strains usually heal within 6 weeks. RELATED COMPLICATIONS   Recurring symptoms, resulting in a chronic problem.  Chronic inflammation, scarring, and partial muscle-tendon tear.  Delayed healing or resolution of symptoms.  Prolonged disability. TREATMENT    Treatment first involves the use of ice and medicine, to reduce pain and inflammation. The use of strengthening and stretching exercises may help reduce pain with activity. These exercises may be performed at home or with a therapist. Severe injuries may require referral to a therapist for further evaluation and treatment, such as ultrasound. Your caregiver may  advise that you wear a back brace or corset, to help reduce pain and discomfort. Often, prolonged bed rest results in greater harm then benefit. Corticosteroid injections may be recommended. However, these should be reserved for the most serious cases. It is important to avoid using your back when lifting objects. At night, sleep on your back on a firm mattress with a pillow placed under your knees. If non-surgical treatment is unsuccessful, surgery may be needed.  MEDICATION   If pain medicine is needed, nonsteroidal anti-inflammatory medicines (aspirin and ibuprofen), or other minor pain relievers (acetaminophen), are often advised.  Do not take pain medicine for 7 days before surgery.  Prescription pain relievers may be given, if your caregiver thinks they are needed. Use only as directed and only as much as you need.  Ointments applied to the skin may be helpful.  Corticosteroid injections may be given by your caregiver. These injections should be reserved for the most serious cases, because they may only be given a certain number of times. HEAT AND COLD  Cold treatment (icing) should be applied for 10 to 15 minutes every 2 to 3 hours for inflammation and pain, and immediately after activity that aggravates your symptoms. Use ice packs or an ice massage.  Heat treatment may be used before performing stretching and strengthening activities prescribed by your caregiver, physical therapist, or athletic trainer. Use a heat pack or a warm water soak. SEEK MEDICAL CARE IF:   Symptoms get worse or do not improve in 2 to 4 weeks, despite  treatment.  You develop numbness, weakness, or loss of bowel or bladder function.  New, unexplained symptoms develop. (Drugs used in treatment may produce side effects.) EXERCISES  RANGE OF MOTION (ROM) AND STRETCHING EXERCISES - Low Back Strain Most people with lower back pain will find that their symptoms get worse with excessive bending forward (flexion) or arching at the lower back (extension). The exercises which will help resolve your symptoms will focus on the opposite motion.  Your physician, physical therapist or athletic trainer will help you determine which exercises will be most helpful to resolve your lower back pain. Do not complete any exercises without first consulting with your caregiver. Discontinue any exercises which make your symptoms worse until you speak to your caregiver.  If you have pain, numbness or tingling which travels down into your buttocks, leg or foot, the goal of the therapy is for these symptoms to move closer to your back and eventually resolve. Sometimes, these leg symptoms will get better, but your lower back pain may worsen. This is typically an indication of progress in your rehabilitation. Be very alert to any changes in your symptoms and the activities in which you participated in the 24 hours prior to the change. Sharing this information with your caregiver will allow him/her to most efficiently treat your condition.  These exercises may help you when beginning to rehabilitate your injury. Your symptoms may resolve with or without further involvement from your physician, physical therapist or athletic trainer. While completing these exercises, remember:  Restoring tissue flexibility helps normal motion to return to the joints. This allows healthier, less painful movement and activity.  An effective stretch should be held for at least 30 seconds.  A stretch should never be painful. You should only feel a gentle lengthening or release in the stretched  tissue. FLEXION RANGE OF MOTION AND STRETCHING EXERCISES: STRETCH - Flexion, Single Knee to Chest   Lie on a firm bed or floor  with both legs extended in front of you.  Keeping one leg in contact with the floor, bring your opposite knee to your chest. Hold your leg in place by either grabbing behind your thigh or at your knee.  Pull until you feel a gentle stretch in your lower back. Hold __________ seconds.  Slowly release your grasp and repeat the exercise with the opposite side. Repeat __________ times. Complete this exercise __________ times per day.  STRETCH - Flexion, Double Knee to Chest   Lie on a firm bed or floor with both legs extended in front of you.  Keeping one leg in contact with the floor, bring your opposite knee to your chest.  Tense your stomach muscles to support your back and then lift your other knee to your chest. Hold your legs in place by either grabbing behind your thighs or at your knees.  Pull both knees toward your chest until you feel a gentle stretch in your lower back. Hold __________ seconds.  Tense your stomach muscles and slowly return one leg at a time to the floor. Repeat __________ times. Complete this exercise __________ times per day.  STRETCH - Low Trunk Rotation  Lie on a firm bed or floor. Keeping your legs in front of you, bend your knees so they are both pointed toward the ceiling and your feet are flat on the floor.  Extend your arms out to the side. This will stabilize your upper body by keeping your shoulders in contact with the floor.  Gently and slowly drop both knees together to one side until you feel a gentle stretch in your lower back. Hold for __________ seconds.  Tense your stomach muscles to support your lower back as you bring your knees back to the starting position. Repeat the exercise to the other side. Repeat __________ times. Complete this exercise __________ times per day  EXTENSION RANGE OF MOTION AND FLEXIBILITY  EXERCISES: STRETCH - Extension, Prone on Elbows   Lie on your stomach on the floor, a bed will be too soft. Place your palms about shoulder width apart and at the height of your head.  Place your elbows under your shoulders. If this is too painful, stack pillows under your chest.  Allow your body to relax so that your hips drop lower and make contact more completely with the floor.  Hold this position for __________ seconds.  Slowly return to lying flat on the floor. Repeat __________ times. Complete this exercise __________ times per day.  RANGE OF MOTION - Extension, Prone Press Ups  Lie on your stomach on the floor, a bed will be too soft. Place your palms about shoulder width apart and at the height of your head.  Keeping your back as relaxed as possible, slowly straighten your elbows while keeping your hips on the floor. You may adjust the placement of your hands to maximize your comfort. As you gain motion, your hands will come more underneath your shoulders.  Hold this position __________ seconds.  Slowly return to lying flat on the floor. Repeat __________ times. Complete this exercise __________ times per day.  RANGE OF MOTION- Quadruped, Neutral Spine   Assume a hands and knees position on a firm surface. Keep your hands under your shoulders and your knees under your hips. You may place padding under your knees for comfort.  Drop your head and point your tail bone toward the ground below you. This will round out your lower back like an angry cat. Hold  this position for __________ seconds.  Slowly lift your head and release your tail bone so that your back sags into a large arch, like an old horse.  Hold this position for __________ seconds.  Repeat this until you feel limber in your lower back.  Now, find your "sweet spot." This will be the most comfortable position somewhere between the two previous positions. This is your neutral spine. Once you have found this  position, tense your stomach muscles to support your lower back.  Hold this position for __________ seconds. Repeat __________ times. Complete this exercise __________ times per day.  STRENGTHENING EXERCISES - Low Back Strain These exercises may help you when beginning to rehabilitate your injury. These exercises should be done near your "sweet spot." This is the neutral, low-back arch, somewhere between fully rounded and fully arched, that is your least painful position. When performed in this safe range of motion, these exercises can be used for people who have either a flexion or extension based injury. These exercises may resolve your symptoms with or without further involvement from your physician, physical therapist or athletic trainer. While completing these exercises, remember:   Muscles can gain both the endurance and the strength needed for everyday activities through controlled exercises.  Complete these exercises as instructed by your physician, physical therapist or athletic trainer. Increase the resistance and repetitions only as guided.  You may experience muscle soreness or fatigue, but the pain or discomfort you are trying to eliminate should never worsen during these exercises. If this pain does worsen, stop and make certain you are following the directions exactly. If the pain is still present after adjustments, discontinue the exercise until you can discuss the trouble with your caregiver. STRENGTHENING - Deep Abdominals, Pelvic Tilt  Lie on a firm bed or floor. Keeping your legs in front of you, bend your knees so they are both pointed toward the ceiling and your feet are flat on the floor.  Tense your lower abdominal muscles to press your lower back into the floor. This motion will rotate your pelvis so that your tail bone is scooping upwards rather than pointing at your feet or into the floor.  With a gentle tension and even breathing, hold this position for __________  seconds. Repeat __________ times. Complete this exercise __________ times per day.  STRENGTHENING - Abdominals, Crunches   Lie on a firm bed or floor. Keeping your legs in front of you, bend your knees so they are both pointed toward the ceiling and your feet are flat on the floor. Cross your arms over your chest.  Slightly tip your chin down without bending your neck.  Tense your abdominals and slowly lift your trunk high enough to just clear your shoulder blades. Lifting higher can put excessive stress on the lower back and does not further strengthen your abdominal muscles.  Control your return to the starting position. Repeat __________ times. Complete this exercise __________ times per day.  STRENGTHENING - Quadruped, Opposite UE/LE Lift   Assume a hands and knees position on a firm surface. Keep your hands under your shoulders and your knees under your hips. You may place padding under your knees for comfort.  Find your neutral spine and gently tense your abdominal muscles so that you can maintain this position. Your shoulders and hips should form a rectangle that is parallel with the floor and is not twisted.  Keeping your trunk steady, lift your right hand no higher than your shoulder and then  your left leg no higher than your hip. Make sure you are not holding your breath. Hold this position __________ seconds.  Continuing to keep your abdominal muscles tense and your back steady, slowly return to your starting position. Repeat with the opposite arm and leg. Repeat __________ times. Complete this exercise __________ times per day.  STRENGTHENING - Lower Abdominals, Double Knee Lift  Lie on a firm bed or floor. Keeping your legs in front of you, bend your knees so they are both pointed toward the ceiling and your feet are flat on the floor.  Tense your abdominal muscles to brace your lower back and slowly lift both of your knees until they come over your hips. Be certain not to hold  your breath.  Hold __________ seconds. Using your abdominal muscles, return to the starting position in a slow and controlled manner. Repeat __________ times. Complete this exercise __________ times per day.  POSTURE AND BODY MECHANICS CONSIDERATIONS - Low Back Strain Keeping correct posture when sitting, standing or completing your activities will reduce the stress put on different body tissues, allowing injured tissues a chance to heal and limiting painful experiences. The following are general guidelines for improved posture. Your physician or physical therapist will provide you with any instructions specific to your needs. While reading these guidelines, remember:  The exercises prescribed by your provider will help you have the flexibility and strength to maintain correct postures.  The correct posture provides the best environment for your joints to work. All of your joints have less wear and tear when properly supported by a spine with good posture. This means you will experience a healthier, less painful body.  Correct posture must be practiced with all of your activities, especially prolonged sitting and standing. Correct posture is as important when doing repetitive low-stress activities (typing) as it is when doing a single heavy-load activity (lifting). RESTING POSITIONS Consider which positions are most painful for you when choosing a resting position. If you have pain with flexion-based activities (sitting, bending, stooping, squatting), choose a position that allows you to rest in a less flexed posture. You would want to avoid curling into a fetal position on your side. If your pain worsens with extension-based activities (prolonged standing, working overhead), avoid resting in an extended position such as sleeping on your stomach. Most people will find more comfort when they rest with their spine in a more neutral position, neither too rounded nor too arched. Lying on a non-sagging bed  on your side with a pillow between your knees, or on your back with a pillow under your knees will often provide some relief. Keep in mind, being in any one position for a prolonged period of time, no matter how correct your posture, can still lead to stiffness. PROPER SITTING POSTURE In order to minimize stress and discomfort on your spine, you must sit with correct posture. Sitting with good posture should be effortless for a healthy body. Returning to good posture is a gradual process. Many people can work toward this most comfortably by using various supports until they have the flexibility and strength to maintain this posture on their own. When sitting with proper posture, your ears will fall over your shoulders and your shoulders will fall over your hips. You should use the back of the chair to support your upper back. Your lower back will be in a neutral position, just slightly arched. You may place a small pillow or folded towel at the base of your lower  back for support.  When working at a desk, create an environment that supports good, upright posture. Without extra support, muscles tire, which leads to excessive strain on joints and other tissues. Keep these recommendations in mind: CHAIR:  A chair should be able to slide under your desk when your back makes contact with the back of the chair. This allows you to work closely.  The chair's height should allow your eyes to be level with the upper part of your monitor and your hands to be slightly lower than your elbows. BODY POSITION  Your feet should make contact with the floor. If this is not possible, use a foot rest.  Keep your ears over your shoulders. This will reduce stress on your neck and lower back. INCORRECT SITTING POSTURES  If you are feeling tired and unable to assume a healthy sitting posture, do not slouch or slump. This puts excessive strain on your back tissues, causing more damage and pain. Healthier options  include:  Using more support, like a lumbar pillow.  Switching tasks to something that requires you to be upright or walking.  Talking a brief walk.  Lying down to rest in a neutral-spine position. PROLONGED STANDING WHILE SLIGHTLY LEANING FORWARD  When completing a task that requires you to lean forward while standing in one place for a long time, place either foot up on a stationary 2-4 inch high object to help maintain the best posture. When both feet are on the ground, the lower back tends to lose its slight inward curve. If this curve flattens (or becomes too large), then the back and your other joints will experience too much stress, tire more quickly, and can cause pain. CORRECT STANDING POSTURES Proper standing posture should be assumed with all daily activities, even if they only take a few moments, like when brushing your teeth. As in sitting, your ears should fall over your shoulders and your shoulders should fall over your hips. You should keep a slight tension in your abdominal muscles to brace your spine. Your tailbone should point down to the ground, not behind your body, resulting in an over-extended swayback posture.  INCORRECT STANDING POSTURES  Common incorrect standing postures include a forward head, locked knees and/or an excessive swayback. WALKING Walk with an upright posture. Your ears, shoulders and hips should all line-up. PROLONGED ACTIVITY IN A FLEXED POSITION When completing a task that requires you to bend forward at your waist or lean over a low surface, try to find a way to stabilize 3 out of 4 of your limbs. You can place a hand or elbow on your thigh or rest a knee on the surface you are reaching across. This will provide you more stability so that your muscles do not fatigue as quickly. By keeping your knees relaxed, or slightly bent, you will also reduce stress across your lower back. CORRECT LIFTING TECHNIQUES DO :   Assume a wide stance. This will provide  you more stability and the opportunity to get as close as possible to the object which you are lifting.  Tense your abdominals to brace your spine. Bend at the knees and hips. Keeping your back locked in a neutral-spine position, lift using your leg muscles. Lift with your legs, keeping your back straight.  Test the weight of unknown objects before attempting to lift them.  Try to keep your elbows locked down at your sides in order get the best strength from your shoulders when carrying an object.  Always  ask for help when lifting heavy or awkward objects. INCORRECT LIFTING TECHNIQUES DO NOT:   Lock your knees when lifting, even if it is a small object.  Bend and twist. Pivot at your feet or move your feet when needing to change directions.  Assume that you can safely pick up even a paper clip without proper posture.   This information is not intended to replace advice given to you by your health care provider. Make sure you discuss any questions you have with your health care provider.   Document Released: 07/13/2005 Document Revised: 08/03/2014 Document Reviewed: 10/25/2008 Elsevier Interactive Patient Education [employer].

## 2016-02-26 ENCOUNTER — Telehealth: Payer: Self-pay | Admitting: Family Medicine

## 2016-02-26 ENCOUNTER — Encounter: Payer: Self-pay | Admitting: Family Medicine

## 2016-02-26 DIAGNOSIS — Z9884 Bariatric surgery status: Secondary | ICD-10-CM | POA: Insufficient documentation

## 2016-02-26 DIAGNOSIS — R7301 Impaired fasting glucose: Secondary | ICD-10-CM | POA: Insufficient documentation

## 2016-02-26 NOTE — Telephone Encounter (Signed)
Pt called stated she is concerned about knots in her stomach stated they are hard as a rock, pt also mentions concerns with tightness and pain in her knees. Please call pt and follow up ASAP. Thanks.

## 2016-02-27 ENCOUNTER — Ambulatory Visit (INDEPENDENT_AMBULATORY_CARE_PROVIDER_SITE_OTHER): Payer: Managed Care, Other (non HMO) | Admitting: Family Medicine

## 2016-02-27 ENCOUNTER — Encounter: Payer: Self-pay | Admitting: Family Medicine

## 2016-02-27 VITALS — BP 145/98 | HR 69 | Temp 98.4°F | Wt 235.0 lb

## 2016-02-27 DIAGNOSIS — M25562 Pain in left knee: Secondary | ICD-10-CM

## 2016-02-27 DIAGNOSIS — T148 Other injury of unspecified body region: Secondary | ICD-10-CM

## 2016-02-27 DIAGNOSIS — T148XXA Other injury of unspecified body region, initial encounter: Secondary | ICD-10-CM
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
## 2016-02-27 NOTE — Telephone Encounter (Signed)
Left message for patient to call back and schedule an appointment so that these issues can be evaluated by Dr.Johnson.

## 2016-03-05 ENCOUNTER — Ambulatory Visit: Payer: Managed Care, Other (non HMO) | Admitting: Family Medicine

## 2016-03-12 ENCOUNTER — Telehealth: Payer: Self-pay | Admitting: Family Medicine

## 2016-03-12 NOTE — Telephone Encounter (Signed)
Pt called and stated that [employer] doesn't bill third party and wondered If she could just go to a chiropractor. After speaking with Dr Wynetta Emery I told the pt it was ok and she could go to the chiropractor if she wanted to but it was different from physical therapy. Dr Wynetta Emery suggested the pt contact [facility] in [place] and she stated that she will call back and let me know what she was able to get accomplished.

## 2016-03-12 NOTE — Telephone Encounter (Signed)
Most PT offices do require payment at the time of service, especially because it's PT.   Dr. Wynetta Emery can try [facility], that's the only place I can think of that might would help her.

## 2016-03-12 NOTE — Telephone Encounter (Signed)
Keri: Can you find out what PT will do this?  Please notify Dr.Johnson when figured out so that she can refer patient to new office

## 2016-03-12 NOTE — Telephone Encounter (Signed)
Patient notified

## 2016-03-12 NOTE — Telephone Encounter (Signed)
I have no idea about any of that. She would have to contact the PT and ask them.

## 2016-03-12 NOTE — Telephone Encounter (Signed)
[facility] requires payment at time of service and the pts attorney told her some places allow you to complete therapy then submit the bills to the insurance company at the end and she wondered if there was another physical therapist around that may possibly do this.

## 2016-03-19 ENCOUNTER — Ambulatory Visit (INDEPENDENT_AMBULATORY_CARE_PROVIDER_SITE_OTHER): Payer: Managed Care, Other (non HMO) | Admitting: Family Medicine

## 2016-03-19 ENCOUNTER — Encounter: Payer: Self-pay | Admitting: Family Medicine

## 2016-03-19 VITALS — BP 133/86 | HR 86 | Temp 99.0°F | Ht 65.2 in | Wt 233.0 lb

## 2016-03-19 DIAGNOSIS — E669 Obesity, unspecified: Secondary | ICD-10-CM

## 2016-03-19 DIAGNOSIS — Z124 Encounter for screening for malignant neoplasm of cervix: Secondary | ICD-10-CM | POA: Diagnosis not present

## 2016-03-19 DIAGNOSIS — Z1239 Encounter for other screening for malignant neoplasm of breast: Secondary | ICD-10-CM | POA: Diagnosis not present

## 2016-03-19 DIAGNOSIS — Z23 Encounter for immunization: Secondary | ICD-10-CM

## 2016-03-19 DIAGNOSIS — Z0000 Encounter for general adult medical examination without abnormal findings: Secondary | ICD-10-CM

## 2016-03-19 NOTE — Patient Instructions (Addendum)
Health Maintenance, Female Adopting a healthy lifestyle and getting preventive care can go a long way to promote health and wellness. Talk with your health care provider about what schedule of regular examinations is right for you. This is a good chance for you to check in with your provider about disease prevention and staying healthy. In between checkups, there are plenty of things you can do on your own. Experts have done a lot of research about which lifestyle changes and preventive measures are most likely to keep you healthy. Ask your health care provider for more information. WEIGHT AND DIET  Eat a healthy diet  Be sure to include plenty of vegetables, fruits, low-fat dairy products, and lean protein.  Do not eat a lot of foods high in solid fats, added sugars, or salt.  Get regular exercise. This is one of the most important things you can do for your health.  Most adults should exercise for at least 150 minutes each week. The exercise should increase your heart rate and make you sweat (moderate-intensity exercise).  Most adults should also do strengthening exercises at least twice a week. This is in addition to the moderate-intensity exercise.  Maintain a healthy weight  Body mass index (BMI) is a measurement that can be used to identify possible weight problems. It estimates body fat based on height and weight. Your health care provider can help determine your BMI and help you achieve or maintain a healthy weight.  For females 20 years of age and older:   A BMI below 18.5 is considered underweight.  A BMI of 18.5 to 24.9 is normal.  A BMI of 25 to 29.9 is considered overweight.  A BMI of 30 and above is considered obese.  Watch levels of cholesterol and blood lipids  You should start having your blood tested for lipids and cholesterol at 62 years of age, then have this test every 5 years.  You may need to have your cholesterol levels checked more often if:  Your lipid  or cholesterol levels are high.  You are older than 62 years of age.  You are at high risk for heart disease.  CANCER SCREENING   Lung Cancer  Lung cancer screening is recommended for adults 55-80 years old who are at high risk for lung cancer because of a history of smoking.  A yearly low-dose CT scan of the lungs is recommended for people who:  Currently smoke.  Have quit within the past 15 years.  Have at least a 30-pack-year history of smoking. A pack year is smoking an average of one pack of cigarettes a day for 1 year.  Yearly screening should continue until it has been 15 years since you quit.  Yearly screening should stop if you develop a health problem that would prevent you from having lung cancer treatment.  Breast Cancer  Practice breast self-awareness. This means understanding how your breasts normally appear and feel.  It also means doing regular breast self-exams. Let your health care provider know about any changes, no matter how small.  If you are in your 20s or 30s, you should have a clinical breast exam (CBE) by a health care provider every 1-3 years as part of a regular health exam.  If you are 40 or older, have a CBE every year. Also consider having a breast X-ray (mammogram) every year.  If you have a family history of breast cancer, talk to your health care provider about genetic screening.  If you   are at high risk for breast cancer, talk to your health care provider about having an MRI and a mammogram every year.  Breast cancer gene (BRCA) assessment is recommended for women who have family members with BRCA-related cancers. BRCA-related cancers include:  Breast.  Ovarian.  Tubal.  Peritoneal cancers.  Results of the assessment will determine the need for genetic counseling and BRCA1 and BRCA2 testing. Cervical Cancer Your health care provider may recommend that you be screened regularly for cancer of the pelvic organs (ovaries, uterus, and  vagina). This screening involves a pelvic examination, including checking for microscopic changes to the surface of your cervix (Pap test). You may be encouraged to have this screening done every 3 years, beginning at age 21.  For women ages 30-65, health care providers may recommend pelvic exams and Pap testing every 3 years, or they may recommend the Pap and pelvic exam, combined with testing for human papilloma virus (HPV), every 5 years. Some types of HPV increase your risk of cervical cancer. Testing for HPV may also be done on women of any age with unclear Pap test results.  Other health care providers may not recommend any screening for nonpregnant women who are considered low risk for pelvic cancer and who do not have symptoms. Ask your health care provider if a screening pelvic exam is right for you.  If you have had past treatment for cervical cancer or a condition that could lead to cancer, you need Pap tests and screening for cancer for at least 20 years after your treatment. If Pap tests have been discontinued, your risk factors (such as having a new sexual partner) need to be reassessed to determine if screening should resume. Some women have medical problems that increase the chance of getting cervical cancer. In these cases, your health care provider may recommend more frequent screening and Pap tests. Colorectal Cancer  This type of cancer can be detected and often prevented.  Routine colorectal cancer screening usually begins at 62 years of age and continues through 62 years of age.  Your health care provider may recommend screening at an earlier age if you have risk factors for colon cancer.  Your health care provider may also recommend using home test kits to check for hidden blood in the stool.  A small camera at the end of a tube can be used to examine your colon directly (sigmoidoscopy or colonoscopy). This is done to check for the earliest forms of colorectal  cancer.  Routine screening usually begins at age 50.  Direct examination of the colon should be repeated every 5-10 years through 62 years of age. However, you may need to be screened more often if early forms of precancerous polyps or small growths are found. Skin Cancer  Check your skin from head to toe regularly.  Tell your health care provider about any new moles or changes in moles, especially if there is a change in a mole's shape or color.  Also tell your health care provider if you have a mole that is larger than the size of a pencil eraser.  Always use sunscreen. Apply sunscreen liberally and repeatedly throughout the day.  Protect yourself by wearing long sleeves, pants, a wide-brimmed hat, and sunglasses whenever you are outside. HEART DISEASE, DIABETES, AND HIGH BLOOD PRESSURE   High blood pressure causes heart disease and increases the risk of stroke. High blood pressure is more likely to develop in:  People who have blood pressure in the high end   of the normal range (130-139/85-89 mm Hg).  People who are overweight or obese.  People who are African American.  If you are 38-23 years of age, have your blood pressure checked every 3-5 years. If you are 61 years of age or older, have your blood pressure checked every year. You should have your blood pressure measured twice--once when you are at a hospital or clinic, and once when you are not at a hospital or clinic. Record the average of the two measurements. To check your blood pressure when you are not at a hospital or clinic, you can use:  An automated blood pressure machine at a pharmacy.  A home blood pressure monitor.  If you are between 45 years and 39 years old, ask your health care provider if you should take aspirin to prevent strokes.  Have regular diabetes screenings. This involves taking a blood sample to check your fasting blood sugar level.  If you are at a normal weight and have a low risk for diabetes,  have this test once every three years after 62 years of age.  If you are overweight and have a high risk for diabetes, consider being tested at a younger age or more often. PREVENTING INFECTION  Hepatitis B  If you have a higher risk for hepatitis B, you should be screened for this virus. You are considered at high risk for hepatitis B if:  You were born in a country where hepatitis B is common. Ask your health care provider which countries are considered high risk.  Your parents were born in a high-risk country, and you have not been immunized against hepatitis B (hepatitis B vaccine).  You have HIV or AIDS.  You use needles to inject street drugs.  You live with someone who has hepatitis B.  You have had sex with someone who has hepatitis B.  You get hemodialysis treatment.  You take certain medicines for conditions, including cancer, organ transplantation, and autoimmune conditions. Hepatitis C  Blood testing is recommended for:  Everyone born from [birth year] through 1967.  Anyone with known risk factors for hepatitis C. Sexually transmitted infections (STIs)  You should be screened for sexually transmitted infections (STIs) including gonorrhea and chlamydia if:  You are sexually active and are younger than 62 years of age.  You are older than 62 years of age and your health care provider tells you that you are at risk for this type of infection.  Your sexual activity has changed since you were last screened and you are at an increased risk for chlamydia or gonorrhea. Ask your health care provider if you are at risk.  If you do not have HIV, but are at risk, it may be recommended that you take a prescription medicine daily to prevent HIV infection. This is called pre-exposure prophylaxis (PrEP). You are considered at risk if:  You are sexually active and do not regularly use condoms or know the HIV status of your partner(s).  You take drugs by injection.  You are sexually  active with a partner who has HIV. Talk with your health care provider about whether you are at high risk of being infected with HIV. If you choose to begin PrEP, you should first be tested for HIV. You should then be tested every 3 months for as long as you are taking PrEP.  PREGNANCY   If you are premenopausal and you may become pregnant, ask your health care provider about preconception counseling.  If you may  PrEP). You are considered at risk if:    You are sexually active and do not regularly use condoms or know the HIV status of your partner(s).    You take drugs by injection.    You are sexually  active with a partner who has HIV.  Talk with your health care provider about whether you are at high risk of being infected with HIV. If you choose to begin PrEP, you should first be tested for HIV. You should then be tested every 3 months for as long as you are taking PrEP.   PREGNANCY   · If you are premenopausal and you may become pregnant, ask your health care provider about preconception counseling.  · If you may become pregnant, take 400 to 800 micrograms (mcg) of folic acid every day.  · If you want to prevent pregnancy, talk to your health care provider about birth control (contraception).  OSTEOPOROSIS AND MENOPAUSE   · Osteoporosis is a disease in which the bones lose minerals and strength with aging. This can result in serious bone fractures. Your risk for osteoporosis can be identified using a bone density scan.  · If you are 65 years of age or older, or if you are at risk for osteoporosis and fractures, ask your health care provider if you should be screened.  · Ask your health care provider whether you should take a calcium or vitamin D supplement to lower your risk for osteoporosis.  · Menopause may have certain physical symptoms and risks.  · Hormone replacement therapy may reduce some of these symptoms and risks.  Talk to your health care provider about whether hormone replacement therapy is right for you.   HOME CARE INSTRUCTIONS   · Schedule regular health, dental, and eye exams.  · Stay current with your immunizations.    · Do not use any tobacco products including cigarettes, chewing tobacco, or electronic cigarettes.  · If you are pregnant, do not drink alcohol.  · If you are breastfeeding, limit how much and how often you drink alcohol.  · Limit alcohol intake to no more than 1 drink per day for nonpregnant women. One drink equals 12 ounces of beer, 5 ounces of wine, or 1½ ounces of hard liquor.  · Do not use street drugs.  · Do not share needles.  · Ask your health care provider for help if  you need support or information about quitting drugs.  · Tell your health care provider if you often feel depressed.  · Tell your health care provider if you have ever been abused or do not feel safe at home.     This information is not intended to replace advice given to you by your health care provider. Make sure you discuss any questions you have with your health care provider.     Document Released: 01/26/2011 Document Revised: 08/03/2014 Document Reviewed: 06/14/2013  Elsevier Interactive Patient Education ©2016 [employer]  Menopause is a normal process in which your reproductive ability comes to an end. This process happens gradually over a span of months to years, usually between the ages of 48 and 55. Menopause is complete when you have missed 12 consecutive menstrual periods.  It is important to talk with your health care provider about some of the most common conditions that affect postmenopausal women, such as heart disease, cancer, and bone loss (osteoporosis). Adopting a healthy lifestyle and getting preventive care can help to promote your health and wellness. Those actions can also   lower your chances of developing some of these common conditions.  WHAT SHOULD I KNOW ABOUT MENOPAUSE?  During menopause, you may experience a number of symptoms, such as:  · Moderate-to-severe hot flashes.  · Night sweats.  · Decrease in sex drive.  · Mood swings.  · Headaches.  · Tiredness.  · Irritability.  · Memory problems.  · Insomnia.  Choosing to treat or not to treat menopausal changes is an individual decision that you make with your health care provider.  WHAT SHOULD I KNOW ABOUT HORMONE REPLACEMENT THERAPY AND SUPPLEMENTS?  Hormone therapy products are effective for treating symptoms that are associated with menopause, such as hot flashes and night sweats. Hormone replacement carries certain risks, especially as you become older. If you are thinking about using estrogen or estrogen with progestin treatments,  discuss the benefits and risks with your health care provider.  WHAT SHOULD I KNOW ABOUT HEART DISEASE AND STROKE?  Heart disease, heart attack, and stroke become more likely as you age. This may be due, in part, to the hormonal changes that your body experiences during menopause. These can affect how your body processes dietary fats, triglycerides, and cholesterol. Heart attack and stroke are both medical emergencies.  There are many things that you can do to help prevent heart disease and stroke:  · Have your blood pressure checked at least every 1-2 years. High blood pressure causes heart disease and increases the risk of stroke.  · If you are 55-79 years old, ask your health care provider if you should take aspirin to prevent a heart attack or a stroke.  · Do not use any tobacco products, including cigarettes, chewing tobacco, or electronic cigarettes. If you need help quitting, ask your health care provider.  · It is important to eat a healthy diet and maintain a healthy weight.    Be sure to include plenty of vegetables, fruits, low-fat dairy products, and lean protein.    Avoid eating foods that are high in solid fats, added sugars, or salt (sodium).  · Get regular exercise. This is one of the most important things that you can do for your health.    Try to exercise for at least 150 minutes each week. The type of exercise that you do should increase your heart rate and make you sweat. This is known as moderate-intensity exercise.    Try to do strengthening exercises at least twice each week. Do these in addition to the moderate-intensity exercise.  · Know your numbers. Ask your health care provider to check your cholesterol and your blood glucose. Continue to have your blood tested as directed by your health care provider.  WHAT SHOULD I KNOW ABOUT CANCER SCREENING?  There are several types of cancer. Take the following steps to reduce your risk and to catch any cancer development as early as  possible.  Breast Cancer  · Practice breast self-awareness.    This means understanding how your breasts normally appear and feel.    It also means doing regular breast self-exams. Let your health care provider know about any changes, no matter how small.  · If you are 40 or older, have a clinician do a breast exam (clinical breast exam or CBE) every year. Depending on your age, family history, and medical history, it may be recommended that you also have a yearly breast X-ray (mammogram).  · If you have a family history of breast cancer, talk with your health care provider about genetic screening.  ·   If you are at high risk for breast cancer, talk with your health care provider about having an MRI and a mammogram every year.  · Breast cancer (BRCA) gene test is recommended for women who have family members with BRCA-related cancers. Results of the assessment will determine the need for genetic counseling and BRCA1 and for BRCA2 testing. BRCA-related cancers include these types:    Breast. This occurs in males or females.    Ovarian.    Tubal. This may also be called fallopian tube cancer.    Cancer of the abdominal or pelvic lining (peritoneal cancer).    Prostate.    Pancreatic.  Cervical, Uterine, and Ovarian Cancer  Your health care provider may recommend that you be screened regularly for cancer of the pelvic organs. These include your ovaries, uterus, and vagina. This screening involves a pelvic exam, which includes checking for microscopic changes to the surface of your cervix (Pap test).  · For women ages 21-65, health care providers may recommend a pelvic exam and a Pap test every three years. For women ages 30-65, they may recommend the Pap test and pelvic exam, combined with testing for human papilloma virus (HPV), every five years. Some types of HPV increase your risk of cervical cancer. Testing for HPV may also be done on women of any age who have unclear Pap test results.  · Other health care providers  may not recommend any screening for nonpregnant women who are considered low risk for pelvic cancer and have no symptoms. Ask your health care provider if a screening pelvic exam is right for you.  · If you have had past treatment for cervical cancer or a condition that could lead to cancer, you need Pap tests and screening for cancer for at least 20 years after your treatment. If Pap tests have been discontinued for you, your risk factors (such as having a new sexual partner) need to be reassessed to determine if you should start having screenings again. Some women have medical problems that increase the chance of getting cervical cancer. In these cases, your health care provider may recommend that you have screening and Pap tests more often.  · If you have a family history of uterine cancer or ovarian cancer, talk with your health care provider about genetic screening.  · If you have vaginal bleeding after reaching menopause, tell your health care provider.  · There are currently no reliable tests available to screen for ovarian cancer.  Lung Cancer  Lung cancer screening is recommended for adults 55-80 years old who are at high risk for lung cancer because of a history of smoking. A yearly low-dose CT scan of the lungs is recommended if you:  · Currently smoke.  · Have a history of at least 30 pack-years of smoking and you currently smoke or have quit within the past 15 years. A pack-year is smoking an average of one pack of cigarettes per day for one year.  Yearly screening should:  · Continue until it has been 15 years since you quit.  · Stop if you develop a health problem that would prevent you from having lung cancer treatment.  Colorectal Cancer  · This type of cancer can be detected and can often be prevented.  · Routine colorectal cancer screening usually begins at age 50 and continues through age 75.  · If you have risk factors for colon cancer, your health care provider may recommend that you be  screened at an earlier age.  ·   wearing long sleeves, pants, a wide-brimmed hat, and sunglasses. WHAT SHOULD I KNOW ABOUT OSTEOPOROSIS? Osteoporosis is a condition in which bone destruction happens more quickly than new bone creation. After menopause, you may be at an increased risk for osteoporosis. To help prevent osteoporosis or the bone fractures that can happen because of osteoporosis, the following is recommended:  If you are 80-83 years old, get at least 1,000 mg of calcium and at least 600 mg of vitamin D per day.  If you are older than age 5 but younger than age 22, get at least 1,200 mg of calcium and at least 600 mg of vitamin D per day.  If you are older than  age 78, get at least 1,200 mg of calcium and at least 800 mg of vitamin D per day. Smoking and excessive alcohol intake increase the risk of osteoporosis. Eat foods that are rich in calcium and vitamin D, and do weight-bearing exercises several times each week as directed by your health care provider. WHAT SHOULD I KNOW ABOUT HOW MENOPAUSE AFFECTS [place]? Depression may occur at any age, but it is more common as you become older. Common symptoms of depression include:  Low or sad mood.  Changes in sleep patterns.  Changes in appetite or eating patterns.  Feeling an overall lack of motivation or enjoyment of activities that you previously enjoyed.  Frequent crying spells. Talk with your health care provider if you think that you are experiencing depression. WHAT SHOULD I KNOW ABOUT IMMUNIZATIONS? It is important that you get and maintain your immunizations. These include:  Tetanus, diphtheria, and pertussis (Tdap) booster vaccine.  Influenza every year before the flu season begins.  Pneumonia vaccine.  Shingles vaccine. Your health care provider may also recommend other immunizations.   This information is not intended to replace advice given to you by your health care provider. Make sure you discuss any questions you have with your health care provider.   Document Released: 09/04/2005 Document Revised: 08/03/2014 Document Reviewed: 03/15/2014 Elsevier Interactive Patient Education 2016 [employer] Hepatitis B Vaccine: What You Need to Know 1. Why get vaccinated? Hepatitis B is a serious disease that affects the liver. It is caused by the hepatitis B virus. Hepatitis B can cause mild illness lasting a few weeks, or it can lead to a serious, lifelong illness. Hepatitis B virus infection can be either acute or chronic. Acute hepatitis B virus infection is a short-term illness that occurs within the first 6 months after someone is exposed to the hepatitis B virus. This can  lead to:  fever, fatigue, loss of appetite, nausea, and/or vomiting  jaundice (yellow skin or eyes, dark urine, clay-colored bowel movements)  pain in muscles, joints, and stomach Chronic hepatitis B virus infection is a long-term illness that occurs when the hepatitis B virus remains in a person's body. Most people who go on to develop chronic hepatitis B do not have symptoms, but it is still very serious and can lead to:  liver damage (cirrhosis)  liver cancer  death Chronically-infected people can spread hepatitis B virus to others, even if they do not feel or look sick themselves. Up to 1.4 million people in the [place] may have chronic hepatitis B infection. About 90% of infants who get hepatitis B become chronically infected and about 1 out of 4 of them dies. Hepatitis B is spread when blood, semen, or other body fluid infected with the Hepatitis B virus enters the body of a  person who is not infected. People can become infected with the virus through:  Birth (a baby whose mother is infected can be infected at or after birth)  Sharing items such as razors or toothbrushes with an infected person  Contact with the blood or open sores of an infected person  Sex with an infected partner  Sharing needles, syringes, or other drug-injection equipment  Exposure to blood from needlesticks or other sharp instruments Each year about 2,000 people in the [place] die from hepatitis B-related liver disease. Hepatitis B vaccine can prevent hepatitis B and its consequences, including liver cancer and cirrhosis. 2. Hepatitis B vaccine Hepatitis B vaccine is made from parts of the hepatitis B virus. It cannot cause hepatitis B infection. The vaccine is usually given as 3 or 4 shots over a 36-month period. Infants should get their first dose of hepatitis B vaccine at birth and will usually complete the series at 65 months of age. All children and adolescents younger than 47 years of age  who have not yet gotten the vaccine should also be vaccinated. Hepatitis B vaccine is recommended for unvaccinated adults who are at risk for hepatitis B virus infection, including:  People whose sex partners have hepatitis B  Sexually active persons who are not in a long-term monogamous relationship  Persons seeking evaluation or treatment for a sexually transmitted disease  Men who have sexual contact with other men  People who share needles, syringes, or other drug-injection equipment  People who have household contact with someone infected with the hepatitis B virus  Health care and public safety workers at risk for exposure to blood or body fluids  Residents and staff of facilities for developmentally disabled persons  Persons in correctional facilities  Victims of sexual assault or abuse  Travelers to regions with increased rates of hepatitis B  People with chronic liver disease, kidney disease, HIV infection, or diabetes  Anyone who wants to be protected from hepatitis B There are no known risks to getting hepatitis B vaccine at the same time as other vaccines. 3. Some people should not get this vaccine Tell the person who is giving the vaccine:  If the person getting the vaccine has any severe, life-threatening allergies. If you ever had a life-threatening allergic reaction after a dose of hepatitis B vaccine, or have a severe allergy to any part of this vaccine, you may be advised not to get vaccinated. Ask your health care provider if you want information about vaccine components.  If the person getting the vaccine is not feeling well. If you have a mild illness, such as a cold, you can probably get the vaccine today. If you are moderately or severely ill, you should probably wait until you recover. Your doctor can advise you. 4. Risks of a vaccine reaction With any medicine, including vaccines, there is a chance of side effects. These are usually mild and go away on  their own, but serious reactions are also possible. Most people who get hepatitis B vaccine do not have any problems with it. Minor problems following hepatitis B vaccine include:  soreness where the shot was given  temperature of 99.75F or higher If these problems occur, they usually begin soon after the shot and last 1 or 2 days. Your doctor can tell you more about these reactions. Other problems that could happen after this vaccine:  People sometimes faint after a medical procedure, including vaccination. Sitting or lying down for about 15 minutes can help  prevent fainting and injuries caused by a fall. Tell your provider if you feel dizzy, or have vision changes or ringing in the ears.  Some people get shoulder pain that can be more severe and longer-lasting than the more routine soreness that can follow injections. This happens very rarely.  Any medication can cause a severe allergic reaction. Such reactions from a vaccine are very rare, estimated at about 1 in a million doses, and would happen within a few minutes to a few hours after the vaccination. As with any medicine, there is a very remote chance of a vaccine causing a serious injury or death. The safety of vaccines is always being monitored. For more information, visit: http://www.aguilar.org/ 5. What if there is a serious problem? What should I look for?  Look for anything that concerns you, such as signs of a severe allergic reaction, very high fever, or unusual behavior. Signs of a severe allergic reaction can include hives, swelling of the face and throat, difficulty breathing, a fast heartbeat, dizziness, and weakness. These would start a few minutes to a few hours after the vaccination. What should I do?  If you think it is a severe allergic reaction or other emergency that can't wait, call 9-1-1 or get to the nearest hospital. Otherwise, call your clinic. Afterward, the reaction should be reported to the Vaccine  Adverse Event Reporting System (VAERS). Your doctor should file this report, or you can do it yourself through the VAERS web site at www.vaers.SamedayNews.es, or by calling 470 487 1346. VAERS does not give medical advice. 6. The National Vaccine Injury Compensation Program The [employer] Vaccine Injury Compensation Program (VICP) is a federal program that was created to compensate people who may have been injured by certain vaccines. Persons who believe they may have been injured by a vaccine can learn about the program and about filing a claim by calling 407-832-6282 or visiting the [place] website at GoldCloset.com.ee. There is a time limit to file a claim for compensation. 7. How can I learn more?  Ask your healthcare provider. He or she can give you the vaccine package insert or suggest other sources of information.  Call your local or state health department.  Contact the Centers for Disease Control and Prevention (CDC):  Call 276-638-6600 (1-800-CDC-INFO) or  Visit CDC's website at http://hunter.com/ CDC Hepatitis B VIS (02/13/2015)   This information is not intended to replace advice given to you by your health care provider. Make sure you discuss any questions you have with your health care provider.   Document Released: 05/07/2006 Document Revised: 04/03/2015 Document Reviewed: 03/02/2015 Elsevier Interactive Patient Education 2016 [place] oral tablets What is this medicine? LORCASERIN (lor ca SER in) is used to promote and maintain weight loss in obese patients. This medicine should be used with a reduced calorie diet and, if appropriate, an exercise program. This medicine may be used for other purposes; ask your health care provider or pharmacist if you have questions. What should I tell my health care provider before I take this medicine? They need to know if you have any of these conditions: -anatomical deformation of the penis, Peyronie's  disease, or history of priapism (painful and prolonged erection) -diabetes -heart disease -history of blood diseases, like sickle cell anemia or leukemia -history of irregular heartbeat -kidney disease -liver disease -suicidal thoughts, plans, or attempt; a previous suicide attempt by you or a family member -an unusual or allergic reaction to lorcaserin, other medicines, foods, dyes, or preservatives -pregnant or  trying to get pregnant -breast-feeding How should I use this medicine? Take this medicine by mouth with a glass of water. Follow the directions on the prescription label. You can take it with or without food. Take your medicine at regular intervals. Do not take it more often than directed. Do not stop taking except on your doctor's advice. Talk to your pediatrician regarding the use of this medicine in children. Special care may be needed. Overdosage: If you think you have taken too much of this medicine contact a poison control center or emergency room at once. NOTE: This medicine is only for you. Do not share this medicine with others. What if I miss a dose? If you miss a dose, take it as soon as you can. If it is almost time for your next dose, take only that dose. Do not take double or extra doses. What may interact with this medicine? -cabergoline -certain medicines for depression, anxiety, or psychotic disturbances -certain medicines for erectile dysfunction -certain medicines for migraine headache like almotriptan, eletriptan, frovatriptan, naratriptan, rizatriptan, sumatriptan, zolmitriptan -dextromethorphan -linezolid -lithium -medicines for diabetes -other weight loss products -tramadol -St. John's Wort -stimulant medicines for attention disorders, weight loss, or to stay awake -tryptophan This list may not describe all possible interactions. Give your health care provider a list of all the medicines, herbs, non-prescription drugs, or dietary supplements you use.  Also tell them if you smoke, drink alcohol, or use illegal drugs. Some items may interact with your medicine. What should I watch for while using this medicine? This medicine is intended to be used in addition to a healthy diet and appropriate exercise. The best results are achieved this way. Your doctor should instruct you to stop taking this medicine if you do not lose a certain amount of weight within the first 12 weeks of treatment, but it is important that you do not change your dose in any way without consulting your doctor or health care professional. Visit your doctor or health care professional for regular checkups. Your doctor may order blood tests or other tests to see how you are doing. Do not drive, use machinery, or do anything that needs mental alertness until you know how this medicine affects you. This medicine may affect blood sugar levels. If you have diabetes, check with your doctor or health care professional before you change your diet or the dose of your diabetic medicine. Patients and their families should watch out for worsening depression or thoughts of suicide. Also watch out for sudden changes in feelings such as feeling anxious, agitated, panicky, irritable, hostile, aggressive, impulsive, severely restless, overly excited and hyperactive, or not being able to sleep. If this happens, especially at the beginning of treatment or after a change in dose, call your health care professional. Contact your doctor or health care professional right away if you are a man with an erection that lasts longer than 4 hours or if the erection becomes painful. This may be a sign of serious problem and must be treated right away to prevent permanent damage. What side effects may I notice from receiving this medicine? Side effects that you should report to your doctor or health care professional as soon as possible: -allergic reactions like skin rash, itching or hives, swelling of the face, lips,  or tongue -abnormal production of milk -breast enlargement in both males and females -breathing problems -changes in emotions or moods -changes in vision -confusion -erection lasting more than 4 hours or a painful erection -fast  or irregular heart beat -feeling faint or lightheaded, falls -fever or chills, sore throat -hallucination, loss of contact with reality -high or low blood pressure -menstrual changes -restlessness -slow or irregular heartbeat -stiff muscles -sweating -suicidal thoughts or other mood changes -swelling of the ankles, feet, hands -unusually weak or tired -vomiting Side effects that usually do not require medical attention (Report these to your doctor or health care professional if they continue or are bothersome.): -back pain -constipation -cough -dry mouth -nausea -tiredness This list may not describe all possible side effects. Call your doctor for medical advice about side effects. You may report side effects to FDA at 1-800-FDA-1088. Where should I keep my medicine? Keep out of the reach of children. This medicine can be abused. Keep your medicine in a safe place to protect it from theft. Do not share this medicine with anyone. Selling or giving away this medicine is dangerous and against the law. Store at room temperature between 15 and 30 degrees C (59 and 86 degrees F). Throw away any unused medicine after the expiration date. NOTE: This sheet is a summary. It may not cover all possible information. If you have questions about this medicine, talk to your doctor, pharmacist, or health care provider.    2016, Elsevier/Gold Standard. (2015-02-18 16:21:05) Bupropion; Naltrexone extended-release tablets What is this medicine? BUPROPION; NALTREXONE (byoo PROE pee on; nal TREX one) is a combination product used to promote and maintain weight loss in obese adults or overweight adults who also have weight related medical problems. This medicine should be used  with a reduced calorie diet and increased physical activity. This medicine may be used for other purposes; ask your health care provider or pharmacist if you have questions. What should I tell my health care provider before I take this medicine? They need to know if you have any of these conditions: -an eating disorder, such as anorexia or bulimia -diabetes -glaucoma -head injury -heart disease -high blood pressure -history of a drug or alcohol abuse problem -history of a tumor or infection of your brain or spine -history of stroke -history of irregular heartbeat -kidney disease -liver disease -mental illness such as bipolar disorder or psychosis -seizures -suicidal thoughts, plans, or attempt; a previous suicide attempt by you or a family member -an unusual or allergic reaction to bupropion, naltrexone, other medicines, foods, dyes, or preservatives breast-feeding -pregnant or trying to become pregnant How should I use this medicine? Take this medicine by mouth with a glass of water. Follow the directions on the prescription label. Take this medicine in the morning and in the evenings as directed by your healthcare professional. Dennis Bast can take it with or without food. Do not take with high-fat meals as this may increase your risk of seizures. Do not crush, chew, or cut these tablets. Do not take your medicine more often than directed. Do not stop taking this medicine suddenly except upon the advice of your doctor. A special MedGuide will be given to you by the pharmacist with each prescription and refill. Be sure to read this information carefully each time. Talk to your pediatrician regarding the use of this medicine in children. Special care may be needed. Overdosage: If you think you have taken too much of this medicine contact a poison control center or emergency room at once. NOTE: This medicine is only for you. Do not share this medicine with others. What if I miss a dose? If you  miss a dose, skip the missed dose and take  your next tablet at the regular time. Do not take double or extra doses. What may interact with this medicine? Do not take this medicine with any of the following medications: -any prescription or street opioid drug like codiene, heroin, methadone -linezolid -MAOIs like Carbex, Eldepryl, Marplan, Nardil, and Parnate -methylene blue (injected into a vein) -other medicines that contain bupropion like Zyban or Wellbutrin This medicine may also interact with the following medications: -alcohol -certain medicines for anxiety or sleep -certain medicines for blood pressure like metoprolol, propranolol -certain medicines for depression or psychotic disturbances -certain medicines for HIV or AIDS like efavirenz, lopinavir, nelfinavir, ritonavir -certain medicines for irregular heart beat like propafenone, flecainide -certain medicines for Parkinson's disease like amantadine, levodopa -certain medicines for seizures like carbamazepine, phenytoin, phenobarbital -cimetidine -clopidogrel -cyclophosphamide -disulfiram -furazolidone -isoniazid -nicotine -orphenadrine -procarbazine -steroid medicines like prednisone or cortisone -stimulant medicines for attention disorders, weight loss, or to stay awake -tamoxifen -theophylline -thioridazine -thiotepa -ticlopidine -tramadol -warfarin This list may not describe all possible interactions. Give your health care provider a list of all the medicines, herbs, non-prescription drugs, or dietary supplements you use. Also tell them if you smoke, drink alcohol, or use illegal drugs. Some items may interact with your medicine. What should I watch for while using this medicine? This medicine is intended to be used in addition to a healthy diet and appropriate exercise. The best results are achieved this way. Do not increase or in any way change your dose without consulting your doctor or health care professional. Do  not take this medicine with other prescription or over-the-counter weight loss products without consulting your doctor or health care professional. Your doctor should tell you to stop taking this medicine if you do not lose a certain amount of weight within the first 12 weeks of treatment. Visit your doctor or health care professional for regular checkups. Your doctor may order blood tests or other tests to see how you are doing. This medicine may affect blood sugar levels. If you have diabetes, check with your doctor or health care professional before you change your diet or the dose of your diabetic medicine. Patients and their families should watch out for new or worsening depression or thoughts of suicide. Also watch out for sudden changes in feelings such as feeling anxious, agitated, panicky, irritable, hostile, aggressive, impulsive, severely restless, overly excited and hyperactive, or not being able to sleep. If this happens, especially at the beginning of treatment or after a change in dose, call your health care professional. Avoid alcoholic drinks while taking this medicine. Drinking large amounts of alcoholic beverages, using sleeping or anxiety medicines, or quickly stopping the use of these agents while taking this medicine may increase your risk for a seizure. What side effects may I notice from receiving this medicine? Side effects that you should report to your doctor or health care professional as soon as possible: -allergic reactions like skin rash, itching or hives, swelling of the face, lips, or tongue -breathing problems -changes in vision, hearing -chest pain -confusion -dark urine -depressed mood -fast or irregular heart beat -fever -hallucination, loss of contact with reality -increased blood pressure -light-colored stools -redness, blistering, peeling or loosening of the skin, including inside the mouth -right upper belly pain -seizures -suicidal thoughts or other  mood changes -unusually weak or tired -vomiting -yellowing of the eyes or skin Side effects that usually do not require medical attention (Report these to your doctor or health care professional if they continue or are bothersome.): -  constipation -diarrhea -dizziness -dry mouth -headache -nausea -trouble sleeping This list may not describe all possible side effects. Call your doctor for medical advice about side effects. You may report side effects to FDA at 1-800-FDA-1088. Where should I keep my medicine? Keep out of the reach of children. Store at room temperature between 15 and 30 degrees C (59 and 86 degrees F). Throw away any unused medicine after the expiration date. NOTE: This sheet is a summary. It may not cover all possible information. If you have questions about this medicine, talk to your doctor, pharmacist, or health care provider.    2016, Elsevier/Gold Standard. (2013-04-19 15:17:29) Liraglutide injection (Weight Management) What is this medicine? LIRAGLUTIDE (LIR a GLOO tide) is used with a reduced calorie diet and exercise to help you lose weight. This medicine may be used for other purposes; ask your health care provider or pharmacist if you have questions. What should I tell my health care provider before I take this medicine? They need to know if you have any of these conditions: -endocrine tumors (MEN 2) or if someone in your family had these tumors -gallstones -high cholesterol -history of alcohol abuse problem -history of pancreatitis -kidney disease or if you are on dialysis -liver disease -previous swelling of the tongue, face, or lips with difficulty breathing, difficulty swallowing, hoarseness, or tightening of the throat -stomach problems -suicidal thoughts, plans, or attempt; a previous suicide attempt by you or a family member -thyroid cancer or if someone in your family had thyroid cancer -an unusual or allergic reaction to liraglutide, medicines,  foods, dyes, or preservatives -pregnant or trying to get pregnant -breast-feeding How should I use this medicine? This medicine is for injection under the skin of your upper leg, stomach area, or upper arm. You will be taught how to prepare and give this medicine. Use exactly as directed. Take your medicine at regular intervals. Do not take it more often than directed. It is important that you put your used needles and syringes in a special sharps container. Do not put them in a trash can. If you do not have a sharps container, call your pharmacist or healthcare provider to get one. A special MedGuide will be given to you by the pharmacist with each prescription and refill. Be sure to read this information carefully each time. Talk to your pediatrician regarding the use of this medicine in children. Special care may be needed. Overdosage: If you think you have taken too much of this medicine contact a poison control center or emergency room at once. NOTE: This medicine is only for you. Do not share this medicine with others. What if I miss a dose? If you miss a dose, take it as soon as you can. If it is almost time for your next dose, take only that dose. Do not take double or extra doses. If you miss your dose for 3 days or more, call your doctor or health care professional to talk about how to restart this medicine. What may interact with this medicine? -acetaminophen -atorvastatin -birth control pills -digoxin -griseofulvin -lisinopril This list may not describe all possible interactions. Give your health care provider a list of all the medicines, herbs, non-prescription drugs, or dietary supplements you use. Also tell them if you smoke, drink alcohol, or use illegal drugs. Some items may interact with your medicine. What should I watch for while using this medicine? Visit your doctor or health care professional for regular checks on your progress. This medicine is  intended to be used in  addition to a healthy diet and appropriate exercise. The best results are achieved this way. Do not increase or in any way change your dose without consulting your doctor or health care professional. This medicine may affect blood sugar levels. If you have diabetes, check with your doctor or health care professional before you change your diet or the dose of your diabetic medicine. Patients and their families should watch out for worsening depression or thoughts of suicide. Also watch out for sudden changes in feelings such as feeling anxious, agitated, panicky, irritable, hostile, aggressive, impulsive, severely restless, overly excited and hyperactive, or not being able to sleep. If this happens, especially at the beginning of treatment or after a change in dose, call your health care professional. What side effects may I notice from receiving this medicine? Side effects that you should report to your doctor or health care professional as soon as possible: -allergic reactions like skin rash, itching or hives, swelling of the face, lips, or tongue -breathing problems -fever, chills -loss of appetite -signs and symptoms of low blood sugar such as feeling anxious, confusion, dizziness, increased hunger, unusually weak or tired, sweating, shakiness, cold, irritable, headache, blurred vision, fast heartbeat, loss of consciousness -trouble passing urine or change in the amount of urine -unusual stomach pain or upset -vomiting Side effects that usually do not require medical attention (Report these to your doctor or health care professional if they continue or are bothersome.): -constipation -diarrhea -fatigue -headache -nausea This list may not describe all possible side effects. Call your doctor for medical advice about side effects. You may report side effects to FDA at 1-800-FDA-1088. Where should I keep my medicine? Keep out of the reach of children. Store unopened pen in a refrigerator between  2 and 8 degrees C (36 and 46 degrees F). Do not freeze or use if the medicine has been frozen. Protect from light and excessive heat. After you first use the pen, it can be stored at room temperature between 15 and 30 degrees C (59 and 86 degrees F) or in a refrigerator. Throw away your used pen after 30 days or after the expiration date, whichever comes first. Do not store your pen with the needle attached. If the needle is left on, medicine may leak from the pen. NOTE: This sheet is a summary. It may not cover all possible information. If you have questions about this medicine, talk to your doctor, pharmacist, or health care provider.    2016, Elsevier/Gold Standard. (2013-09-07 12:29:49) Varicella-Zoster Virus Vaccine Live injection What is this medicine? VARICELLA VIRUS VACCINE (var uh SEL uh VAHY ruhs vak SEEN) is used to prevent infections of chickenpox. HERPES ZOSTER VIRUS VACCINE (HUR peez ZOS ter vahy ruhs vak SEEN) is used to prevent shingles in adults 62 years old and over. This vaccine is not used to treat shingles or nerve pain from shingles. These medicines may be used for other purposes; ask your health care provider or pharmacist if you have questions. This medicine may be used for other purposes; ask your health care provider or pharmacist if you have questions. What should I tell my health care provider before I take this medicine? They need to know if you have any of the following conditions: -blood disorders or disease -cancer like leukemia or lymphoma -immune system problems or therapy -infection with fever -recent immune globulin therapy -tuberculosis -an unusual or allergic reaction to vaccines, neomycin, gelatin, other medicines, foods, dyes, or preservatives -  pregnant or trying to get pregnant -breast-feeding How should I use this medicine? These vaccines are for injection under the skin. They are given by a health care professional. A copy of Vaccine Information  Statements will be given before each varicella virus vaccination. Read this sheet carefully each time. The sheet may change frequently. A Vaccine Information Statement is not given before the herpes zoster virus vaccine. Talk to your pediatrician regarding the use of the varicella virus vaccine in children. While this drug may be prescribed for children as young as 54 months of age for selected conditions, precautions do apply. The herpes zoster virus vaccine is not approved in children. Overdosage: If you think you have taken too much of this medicine contact a poison control center or emergency room at once. NOTE: This medicine is only for you. Do not share this medicine with others. What if I miss a dose? Keep appointments for follow-up (booster) doses of varicella virus vaccine as directed. It is important not to miss your dose. Call your doctor or health care professional if you are unable to keep an appointment. Follow-up (booster) doses are not needed for the herpes zoster virus vaccine. What may interact with this medicine? Do not take these medicines with any of the following medications: -adalimumab -anakinra -etanercept -infliximab -medicines that suppress your immune system -medicines to treat cancer These medicines may also interact with the following medications: -aspirin and aspirin-like medicines (varicella virus vaccine only) -blood transfusions (varicella virus vaccine only) -immunoglobulins (varicella virus vaccine only) -steroid medicines like prednisone or cortisone This list may not describe all possible interactions. Give your health care provider a list of all the medicines, herbs, non-prescription drugs, or dietary supplements you use. Also tell them if you smoke, drink alcohol, or use illegal drugs. Some items may interact with your medicine. What should I watch for while using this medicine? Visit your doctor for regular check ups. These vaccines, like all vaccines,  may not fully protect everyone. After receiving these vaccines it may be possible to pass chickenpox infection to others. For up to 6 weeks, avoid people with immune system problems, pregnant women who have not had chickenpox, newborns of women who have not had chickenpox, and all newborns born at less than 28 weeks of pregnancy. Talk to your doctor for more information. Do not become pregnant for 3 months after taking these vaccines. Women should inform their doctor if they wish to become pregnant or think they might be pregnant. There is a potential for serious side effects to an unborn child. Talk to your health care professional or pharmacist for more information. What side effects may I notice from receiving this medicine? Side effects that you should report to your doctor or health care professional as soon as possible: -allergic reactions like skin rash, itching or hives, swelling of the face, lips, or tongue -breathing problems -extreme changes in behavior -feeling faint or lightheaded, falls -fever over 102 degrees F -pain, tingling, numbness in the hands or feet -redness, blistering, peeling or loosening of the skin, including inside the mouth -seizures -unusually weak or tired Side effects that usually do not require medical attention (report to your doctor or health care professional if they continue or are bothersome): -aches or pains -chickenpox-like rash -diarrhea -headache -low-grade fever under 102 degrees F -loss of appetite -nausea, vomiting -redness, pain, swelling at site where injected -sleepy -trouble sleeping This list may not describe all possible side effects. Call your doctor for medical advice about side  effects. You may report side effects to FDA at 1-800-FDA-1088. Where should I keep my medicine? These drugs are given in a hospital or clinic and will not be stored at home. NOTE: This sheet is a summary. It may not cover all possible information. If you have  questions about this medicine, talk to your doctor, pharmacist, or health care provider.    2016, Elsevier/Gold Standard. (2013-03-17 14:24:35)

## 2016-03-19 NOTE — Progress Notes (Signed)
BP 133/86 (BP Location: Left Arm, Patient Position: Sitting, Cuff Size: Large)   Pulse 86   Temp 99 F (37.2 C)   Ht 5' 5.2" (1.656 m)   Wt 233 lb (105.7 kg)   SpO2 99%   BMI 38.54 kg/m    Subjective:    Patient ID: Sandra Mendez, female    DOB: [birth year], 60 y.o.   MRN: [id number]  HPI: Sandra Mendez is a 60 y.o. female presenting on 03/19/2016 for comprehensive medical examination. Current medical complaints include:  WEIGHT GAIN- gained back about 16 lbs after bariatric surgery 4 years ago. Duration: 4 years Previous attempts at weight loss: yes Complications of obesity:  Peak weight: 290 Weight loss goal: 145lbs Weight loss to date: -16lbs  Requesting obesity pharmacotherapy: no Current weight loss supplements/medications: no Previous weight loss supplements/meds: no  She currently lives with: husband Menopausal Symptoms: no  Depression Screen done today and results listed below:  Depression screen [facility] 2/9 03/19/2016  Decreased Interest 0  Down, Depressed, Hopeless 0  PHQ - 2 Score 0     Past Medical History:  Past Medical History:  Diagnosis Date  . History of colon polyps     Surgical History:  Past Surgical History:  Procedure Laterality Date  . [place] SURGERY  2013  . CHOLECYSTECTOMY    . WISDOM TOOTH EXTRACTION      Medications:  Current Outpatient Prescriptions on File Prior to Visit  Medication Sig  . cyclobenzaprine (FLEXERIL) 10 MG tablet Take 1 tablet (10 mg total) by mouth 3 (three) times daily as needed for muscle spasms.  . Vitamin D, Ergocalciferol, (DRISDOL) 50000 units CAPS capsule Take 1 capsule (50,000 Units total) by mouth every 7 (seven) days.   No current facility-administered medications on file prior to visit.     Allergies:  Allergies  Allergen Reactions  . Erythromycin Rash  . Penicillins     Social History:  Social History   Social History  . Marital status: Married    Spouse name: N/A  . Number of  children: N/A  . Years of education: N/A   Occupational History  . Not on file.   Social History Main Topics  . Smoking status: Never Smoker  . Smokeless tobacco: Never Used  . Alcohol use No  . Drug use: No  . Sexual activity: Not on file   Other Topics Concern  . Not on file   Social History Narrative  . No narrative on file   History  Smoking Status  . Never Smoker  Smokeless Tobacco  . Never Used   History  Alcohol Use No    Family History:  Family History  Problem Relation Age of Onset  . Colon cancer Sister   . Diabetes Sister   . Diabetes Mother   . Stroke Father   . Diabetes Sister     Past medical history, surgical history, medications, allergies, family history and social history reviewed with patient today and changes made to appropriate areas of the chart.   Review of Systems  Constitutional: Negative.   HENT: Negative.   Eyes: Negative.   Respiratory: Negative.   Cardiovascular: Negative.   Gastrointestinal: Negative.   Genitourinary: Negative.   Musculoskeletal: Positive for myalgias. Negative for back pain, falls, joint pain and neck pain.  Skin: Negative.   Neurological: Negative.   Endo/Heme/Allergies: Positive for environmental allergies. Negative for polydipsia. Does not bruise/bleed easily.  Psychiatric/Behavioral: Negative.     All other ROS  negative except what is listed above and in the HPI.      Objective:    BP 133/86 (BP Location: Left Arm, Patient Position: Sitting, Cuff Size: Large)   Pulse 86   Temp 99 F (37.2 C)   Ht 5' 5.2" (1.656 m)   Wt 233 lb (105.7 kg)   SpO2 99%   BMI 38.54 kg/m   Wt Readings from Last 3 Encounters:  03/19/16 233 lb (105.7 kg)  02/27/16 235 lb (106.6 kg)  02/18/16 233 lb (105.7 kg)    Physical Exam  Constitutional: She is oriented to person, place, and time. She appears well-developed and well-nourished. No distress.  HENT:  Head: Normocephalic and atraumatic.  Right Ear: Hearing and  external ear normal.  Left Ear: Hearing and external ear normal.  Nose: Nose normal.  Mouth/Throat: Oropharynx is clear and moist. No oropharyngeal exudate.  Eyes: Conjunctivae, EOM and lids are normal. Pupils are equal, round, and reactive to light. Right eye exhibits no discharge. Left eye exhibits no discharge. No scleral icterus.  Neck: Normal range of motion. Neck supple. No JVD present. No tracheal deviation present. No thyromegaly present.  Cardiovascular: Normal rate, regular rhythm, normal heart sounds and intact distal pulses.  Exam reveals no gallop and no friction rub.   No murmur heard. Pulmonary/Chest: Effort normal and breath sounds normal. No stridor. No respiratory distress. She has no wheezes. She has no rales. She exhibits no tenderness. Right breast exhibits no inverted nipple, no mass, no nipple discharge, no skin change and no tenderness. Left breast exhibits no inverted nipple, no mass, no nipple discharge, no skin change and no tenderness. Breasts are symmetrical.  Abdominal: Soft. Bowel sounds are normal. She exhibits no distension and no mass. There is no tenderness. There is no rebound and no guarding. Hernia confirmed negative in the right inguinal area and confirmed negative in the left inguinal area.  Genitourinary: Vagina normal and uterus normal. No breast swelling, tenderness, discharge or bleeding. No labial fusion. There is no rash, tenderness, lesion or injury on the right labia. There is no rash, tenderness, lesion or injury on the left labia. Uterus is not deviated, not enlarged, not fixed and not tender. Cervix exhibits no motion tenderness, no discharge and no friability. Right adnexum displays no mass, no tenderness and no fullness. Left adnexum displays no mass, no tenderness and no fullness. No erythema, tenderness or bleeding in the vagina. No foreign body in the vagina. No signs of injury around the vagina. No vaginal discharge found.    Musculoskeletal:  Normal range of motion. She exhibits no edema, tenderness or deformity.  Healing hematoma on LLQ  Lymphadenopathy:    She has no cervical adenopathy.  Neurological: She is alert and oriented to person, place, and time. She has normal reflexes. She displays normal reflexes. No cranial nerve deficit. She exhibits normal muscle tone. Coordination normal.  Skin: Skin is warm, dry and intact. No rash noted. She is not diaphoretic. No erythema. No pallor.  Psychiatric: She has a normal mood and affect. Her speech is normal and behavior is normal. Judgment and thought content normal. Cognition and memory are normal.  Nursing note and vitals reviewed.   Results for orders placed or performed in visit on 01/20/16  Microscopic Examination  Result Value Ref Range   WBC, UA 0-5 0 - 5 /hpf   RBC, UA 0-2 0 - 2 /hpf   Epithelial Cells (non renal) 0-10 0 - 10 /hpf   Casts  None seen None seen /lpf   Mucus, UA Present Not Estab.   Bacteria, UA None seen None seen/Few  Vitamin D (25 hydroxy)  Result Value Ref Range   Vit D, 25-Hydroxy 20.2 (L) 30.0 - 100.0 ng/mL  Hepatitis B Titer  Result Value Ref Range   Hep B Surface Ab, Qual Non Reactive   Hepatitis panel, acute  Result Value Ref Range   Hep A IgM Negative Negative   Hepatitis B Surface Ag Negative Negative   Hep B C IgM Negative Negative   Hep C Virus Ab 0.9 0.0 - 0.9 s/co ratio  Executive Panel  Result Value Ref Range   Glucose 94 65 - 99 mg/dL   Uric Acid 5.9 2.5 - 7.1 mg/dL   BUN 15 8 - 27 mg/dL   Creatinine, Ser 0.85 0.57 - 1.00 mg/dL   GFR calc non Af Amer 74 >59 mL/min/1.73   GFR calc Af Amer 85 >59 mL/min/1.73   BUN/Creatinine Ratio 18 12 - 28   Sodium 145 (H) 134 - 144 mmol/L   Potassium 4.0 3.5 - 5.2 mmol/L   Chloride 105 96 - 106 mmol/L   Calcium 8.4 (L) 8.7 - 10.3 mg/dL   Phosphorus 3.3 2.5 - 4.5 mg/dL   Total Protein 6.3 6.0 - 8.5 g/dL   Albumin 4.0 3.6 - 4.8 g/dL   Globulin, Total 2.3 1.5 - 4.5 g/dL   Albumin/Globulin  Ratio 1.7 1.2 - 2.2   Bilirubin Total 0.3 0.0 - 1.2 mg/dL   Alkaline Phosphatase 84 39 - 117 IU/L   LDH 207 119 - 226 IU/L   AST 25 0 - 40 IU/L   ALT 21 0 - 32 IU/L   GGT 20 0 - 60 IU/L   Iron 113 27 - 139 ug/dL   Cholesterol, Total 247 (H) 100 - 199 mg/dL   Triglycerides 87 0 - 149 mg/dL   HDL 74 >39 mg/dL   VLDL Cholesterol Cal 17 5 - 40 mg/dL   LDL Calculated 156 (H) 0 - 99 mg/dL   Chol/HDL Ratio 3.3 0.0 - 4.4 ratio units   Estimated CHD Risk  < 0.5 0.0 - 1.0  times avg.   TSH 3.130 0.450 - 4.500 uIU/mL   T4, Total 6.1 4.5 - 12.0 ug/dL   T3 Uptake Ratio 26 24 - 39 %   Free Thyroxine Index 1.6 1.2 - 4.9   WBC 3.8 3.4 - 10.8 x10E3/uL   RBC 4.17 3.77 - 5.28 x10E6/uL   Hemoglobin 12.6 11.1 - 15.9 g/dL   Hematocrit 38.1 34.0 - 46.6 %   MCV 91 79 - 97 fL   MCH 30.2 26.6 - 33.0 pg   MCHC 33.1 31.5 - 35.7 g/dL   RDW 14.2 12.3 - 15.4 %   Platelets 192 150 - 379 x10E3/uL   Neutrophils 53 %   Lymphs 37 %   Monocytes 6 %   Eos 3 %   Basos 1 %   Neutrophils Absolute 2.0 1.4 - 7.0 x10E3/uL   Lymphocytes Absolute 1.4 0.7 - 3.1 x10E3/uL   Monocytes Absolute 0.2 0.1 - 0.9 x10E3/uL   EOS (ABSOLUTE) 0.1 0.0 - 0.4 x10E3/uL   Basophils Absolute 0.0 0.0 - 0.2 x10E3/uL   Immature Granulocytes 0 %   Immature Grans (Abs) 0.0 0.0 - 0.1 x10E3/uL  Hemoglobin A1c  Result Value Ref Range   Hgb A1c MFr Bld 5.7 (H) 4.8 - 5.6 %  UA/M w/rflx Culture, Routine  Result Value Ref Range  Specific Gravity, UA 1.026 1.005 - 1.030   pH, UA 6.0 5.0 - 7.5   Color, UA Yellow Yellow   Appearance Ur Turbid (A) Clear   Leukocytes, UA Negative Negative   Protein, UA Negative Negative/Trace   Glucose, UA Negative Negative   Ketones, UA Negative Negative   RBC, UA Negative Negative   Bilirubin, UA Negative Negative   Urobilinogen, Ur 0.2 0.2 - 1.0 mg/dL   Nitrite, UA Negative Negative   Microscopic Examination Comment    Microscopic Examination See below:    Urinalysis Reflex Comment       Assessment  & Plan:   Problem List Items Addressed This Visit      Other   Need for immunization against viral hepatitis   Relevant Orders   Hepatitis A vaccine adult IM   Hepatitis B vaccine adult IM   Obesity    Discussed options for weight loss. Will check with her insurance to see what is covered. Follow up pending start.        Other Visit Diagnoses    Routine general medical examination at a health care facility    -  Primary   Vaccines up to date. Screening labs checked previously. Colonoscopy to be done in September. Pap done today. Mammogram ordered. Work on diet and exercise.    Screening for breast cancer       Mammogram ordered today   Relevant Orders   MM Digital Screening   Screening for cervical cancer       Pap with co-testing done today   Relevant Orders   IGP, Aptima HPV, rfx 16/18,45       Follow up plan: Return in about 4 weeks (around 04/16/2016) for weight loss and vaccines.   LABORATORY TESTING:  - Pap smear: pap done  IMMUNIZATIONS:   - Tdap: Tetanus vaccination status reviewed: last tetanus booster within 10 years. - Influenza: Postponed to flu season - Pneumovax: Not applicable - Prevnar: Not applicable - Zostavax vaccine: Will check on coverage  SCREENING: -Mammogram: Ordered today  - Colonoscopy: Appointment scheduled for September  - Bone Density: Not applicable   PATIENT COUNSELING:   Advised to take 1 mg of folate supplement per day if capable of pregnancy.   Sexuality: Discussed sexually transmitted diseases, partner selection, use of condoms, avoidance of unintended pregnancy  and contraceptive alternatives.   Advised to avoid cigarette smoking.  I discussed with the patient that most people either abstain from alcohol or drink within safe limits (<=14/week and <=4 drinks/occasion for males, <=7/weeks and <= 3 drinks/occasion for females) and that the risk for alcohol disorders and other health effects rises proportionally with the number of  drinks per week and how often a drinker exceeds daily limits.  Discussed cessation/primary prevention of drug use and availability of treatment for abuse.   Diet: Encouraged to adjust caloric intake to maintain  or achieve ideal body weight, to reduce intake of dietary saturated fat and total fat, to limit sodium intake by avoiding high sodium foods and not adding table salt, and to maintain adequate dietary potassium and calcium preferably from fresh fruits, vegetables, and low-fat dairy products.    stressed the importance of regular exercise  Injury prevention: Discussed safety belts, safety helmets, smoke detector, smoking near bedding or upholstery.   Dental health: Discussed importance of regular tooth brushing, flossing, and dental visits.    NEXT PREVENTATIVE PHYSICAL DUE IN 1 YEAR. Return in about 4 weeks (around 04/16/2016) for weight loss  and vaccines.

## 2016-03-19 NOTE — Assessment & Plan Note (Signed)
Discussed options for weight loss. Will check with her insurance to see what is covered. Follow up pending start.

## 2016-03-24 ENCOUNTER — Encounter: Payer: Self-pay | Admitting: Family Medicine

## 2016-03-24 LAB — IGP, APTIMA HPV, RFX 16/18,45
HPV Aptima: NEGATIVE
PAP SMEAR COMMENT: 0

## 2016-03-25 ENCOUNTER — Telehealth: Payer: Self-pay | Admitting: Family Medicine
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
## 2016-03-25 NOTE — Telephone Encounter (Signed)
Routing to provider  

## 2016-03-27 ENCOUNTER — Ambulatory Visit (INDEPENDENT_AMBULATORY_CARE_PROVIDER_SITE_OTHER): Payer: Managed Care, Other (non HMO) | Admitting: Family Medicine

## 2016-03-27 ENCOUNTER — Encounter: Payer: Self-pay | Admitting: Family Medicine

## 2016-03-27 DIAGNOSIS — F4310 Post-traumatic stress disorder, unspecified: Secondary | ICD-10-CM | POA: Diagnosis not present

## 2016-03-27 MED ORDER — BUSPIRONE HCL 5 MG PO TABS: ORAL_TABLET | ORAL | 1 refills | Status: DC
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
## 2016-03-27 NOTE — Telephone Encounter (Signed)
If she is still needing that, she would need to be seen

## 2016-03-27 NOTE — Telephone Encounter (Signed)
Called and left patient a voicemail asking for her to please return my call.  

## 2016-03-27 NOTE — Patient Instructions (Addendum)
Posttraumatic Stress Disorder Posttraumatic stress disorder (PTSD) is a mental disorder. It occurs after a traumatic event in your life. The traumatic events that cause PTSD are outside the range of normal human experience. Examples of these events include war, automobile accidents, natural disasters, rape, domestic violence, and violent crimes. Most people who experience these types of events are able to heal on their own. Those who do not heal develop PTSD. PTSD can happen to anyone at any age. However, people with a history of childhood abuse are at increased risk for developing PTSD.  SYMPTOMS  The traumatic event that causes PTSD must be a threat to life, cause serious injury, or involve sexual violence. The traumatic event is usually experienced directly by the person who develops PTSD. Sometimes PTSD occurs in people who witness traumas that occur to others or who hear about a trauma that occurs to a close family member or friend. The following behaviors are characteristic of people with PTSD:  People with PTSD re-experience the traumatic event in one or more of the following ways (intrusion symptoms):  Recurrent, unwanted distressing memories while awake.  Recurrent distressing dreams.  Sensations similar to those felt when the event originally occurred (flashbacks).   Intense or prolonged emotional distress, triggered by reminders of the trauma. This may include fear, horror, intense sadness, or anger.  Marked physical reactions, triggered by reminders of the trauma. This may include racing heart, shortness of breath, sweating, and shaking.  People with PTSD avoid thoughts, conversations, people, or activities that remind them of the traumatic event (avoidance symptoms).  People with PTSD have negative changes in their thinking and mood after the traumatic event. These changes include:  Inability to remember one or more significant aspects of the traumatic event (memory  gaps).  Exaggerated negative perceptions about themselves or others, such as believing that they are bad people or that no one can be trusted.  Unrealistic assignment of blame to themselves or others for the traumatic event.  Persistent negative emotional state, such as fear, horror, anger, sadness, guilt, or shame.  Markedly decreased interest or participation in significant activities.  A loss of connection with other people.  Inability to experience positive emotions, such as happiness or love.  People with PTSD are more sensitive to their environment and react more easily than others (hyperarousal-overreactivity symptoms). These symptoms include:  Irritability, with angry outbursts toward other people or objects. The outbursts are easily triggered and may be verbal or physical.  Careless or self-destructive behavior. This may include reckless driving or drug use.  A feeling of being on edge, with increased alertness (hypervigilance).  Exaggerated reactions to stimuli, such as being easily startled.   Difficulty concentrating.  Difficulty sleeping. PTSD symptoms may start soon after a frightening event or months or years later. They last at least 1 month or longer and can affect one or more areas of functioning, such as social or occupational functioning.  DIAGNOSIS  PTSD is diagnosed through an assessment by a mental health professional. You will be asked questions about the traumatic events in your life. You will also be asked about how these events have changed your thoughts, mood, behavior, and ability to function on a daily basis. You may be asked about your use of alcohol or drugs, which can make PTSD symptoms worse. TREATMENT  Unlike many mental disorders, which require lifelong management, PTSD is a curable condition. The goal of PTSD treatment is to neutralize the negative effects of the traumatic event on daily   functioning, not erase the memory of the event. The  following treatments may be prescribed to reach this goal:  Medicines. Certain medicines can reduce some PTSD symptoms. Intrusion symptoms and hyperarousal-overactivity symptoms respond best to medicines.  Counseling (talk therapy). Talk therapy with a mental health professional who is experienced in treating PTSD can help. Talk therapy can provide education, emotional support, and coping skills. Certain types of talk therapy that specifically target the traumatic events are the most effective treatment for PTSD:  Prolonged exposure therapy, which involves remembering and processing the traumatic event with a therapist in a safe environment until it no longer creates a negative emotional response.  Eye movement desensitization and reprocessing therapy, which involves the use of repetitive physical stimulation of the senses that alternates between the right and left sides of the body. It is believed that this therapy facilitates communication between the two sides of the brain. This communication helps the mind to integrate the fragmented memories of the traumatic event into a whole story that makes sense and no longer creates a negative emotional response. Most people with PTSD benefit from a combination of these treatments.    This information is not intended to replace advice given to you by your health care provider. Make sure you discuss any questions you have with your health care provider.   Document Released: 04/07/2001 Document Revised: 08/03/2014 Document Reviewed: 09/29/2012 Elsevier Interactive Patient Education 2016 [employer]  
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
## 2016-03-27 NOTE — Telephone Encounter (Signed)
Patient scheduled for this afternoon.

## 2016-04-17 ENCOUNTER — Ambulatory Visit: Payer: Managed Care, Other (non HMO) | Admitting: Family Medicine

## 2016-04-24 ENCOUNTER — Other Ambulatory Visit: Payer: Self-pay

## 2016-04-24 ENCOUNTER — Ambulatory Visit (INDEPENDENT_AMBULATORY_CARE_PROVIDER_SITE_OTHER): Payer: Managed Care, Other (non HMO) | Admitting: Family Medicine

## 2016-04-24 ENCOUNTER — Other Ambulatory Visit: Payer: Self-pay | Admitting: Physician Assistant

## 2016-04-24 ENCOUNTER — Encounter: Payer: Self-pay | Admitting: Family Medicine

## 2016-04-24 VITALS — BP 123/84 | HR 78 | Temp 98.6°F | Ht 65.0 in | Wt 236.1 lb

## 2016-04-24 DIAGNOSIS — Z23 Encounter for immunization: Secondary | ICD-10-CM | POA: Diagnosis not present

## 2016-04-24 DIAGNOSIS — F4310 Post-traumatic stress disorder, unspecified: Secondary | ICD-10-CM | POA: Diagnosis not present

## 2016-04-24 DIAGNOSIS — Z299 Encounter for prophylactic measures, unspecified: Secondary | ICD-10-CM

## 2016-04-24 DIAGNOSIS — M5432 Sciatica, left side: Secondary | ICD-10-CM

## 2016-04-24 MED ORDER — CYCLOBENZAPRINE HCL 10 MG PO TABS: 10.0000 mg | ORAL_TABLET | Freq: Three times a day (TID) | ORAL | 0 refills | Status: DC | PRN
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
## 2016-04-24 NOTE — Progress Notes (Signed)
Patient came in to have blood drawn for testing per Dr. Kreg Shropshire from [facility] orders.  I had to sent the patient to the [facility] draw station because one of the tests that was ordered had to be drawn from a tube that we don't have here in the clinic.  I informed patient that I will forward the results to Dr. Wille Glaser once I receive them.
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
## 2016-04-24 NOTE — Patient Instructions (Addendum)
Hepatitis A Vaccine: What You Need to Know 1. Why get vaccinated? Hepatitis A is a serious liver disease. It is caused by the hepatitis A virus (HAV). HAV is spread from person to person through contact with the feces (stool) of people who are infected, which can easily happen if someone does not wash his or her hands properly. You can also get hepatitis A from food, water, or objects contaminated with HAV. Symptoms of hepatitis A can include:  fever, fatigue, loss of appetite, nausea, vomiting, and/or joint pain  severe stomach pains and diarrhea (mainly in children), or  jaundice (yellow skin or eyes, dark urine, clay-colored bowel movements). These symptoms usually appear 2 to 6 weeks after exposure and usually last less than 2 months, although some people can be ill for as long as 6 months. If you have hepatitis A you may be too ill to work. Children often do not have symptoms, but most adults do. You can spread HAV without having symptoms. Hepatitis A can cause liver failure and death, although this is rare and occurs more commonly in persons 5 years of age or older and persons with other liver diseases, such as hepatitis B or C. Hepatitis A vaccine can prevent hepatitis A. Hepatitis A vaccines were recommended in the [place] beginning in 1996. Since then, the number of cases reported each year in the [place] has dropped from around 31,000 cases to fewer than 1,500 cases. 2. Hepatitis A vaccine Hepatitis A vaccine is an inactivated (killed) vaccine. You will need 2 doses for long-lasting protection. These doses should be given at least 6 months apart. Children are routinely vaccinated between their first and second birthdays (19 through 28 months of age). Older children and adolescents can get the vaccine after 23 months. Adults who have not been vaccinated previously and want to be protected against hepatitis A can also get the vaccine. You should get hepatitis A vaccine if you:  are  traveling to countries where hepatitis A is common,  are a man who has sex with other men,  use illegal drugs,  have a chronic liver disease such as hepatitis B or hepatitis C,  are being treated with clotting-factor concentrates,  work with hepatitis A-infected animals or in a hepatitis A research laboratory, or  expect to have close personal contact with an international adoptee from a country where hepatitis A is common Ask your healthcare provider if you want more information about any of these groups. There are no known risks to getting hepatitis A vaccine at the same time as other vaccines. 3. Some people should not get this vaccine Tell the person who is giving you the vaccine:  If you have any severe, life-threatening allergies. If you ever had a life-threatening allergic reaction after a dose of hepatitis A vaccine, or have a severe allergy to any part of this vaccine, you may be advised not to get vaccinated. Ask your health care provider if you want information about vaccine components.  If you are not feeling well. If you have a mild illness, such as a cold, you can probably get the vaccine today. If you are moderately or severely ill, you should probably wait until you recover. Your doctor can advise you. 4. Risks of a vaccine reaction With any medicine, including vaccines, there is a chance of side effects. These are usually mild and go away on their own, but serious reactions are also possible. Most people who get hepatitis A vaccine do not have  any problems with it. Minor problems following hepatitis A vaccine include:  soreness or redness where the shot was given  low-grade fever  headache  tiredness If these problems occur, they usually begin soon after the shot and last 1 or 2 days. Your doctor can tell you more about these reactions. Other problems that could happen after this vaccine:  People sometimes faint after a medical procedure, including vaccination.  Sitting or lying down for about 15 minutes can help prevent fainting, and injuries caused by a fall. Tell your provider if you feel dizzy, or have vision changes or ringing in the ears.  Some people get shoulder pain that can be more severe and longer lasting than the more routine soreness that can follow injections. This happens very rarely.  Any medication can cause a severe allergic reaction. Such reactions from a vaccine are very rare, estimated at about 1 in a million doses, and would happen within a few minutes to a few hours after the vaccination. As with any medicine, there is a very remote chance of a vaccine causing a serious injury or death. The safety of vaccines is always being monitored. For more information, visit: http://www.aguilar.org/ 5. What if there is a serious problem? What should I look for?  Look for anything that concerns you, such as signs of a severe allergic reaction, very high fever, or unusual behavior. Signs of a severe allergic reaction can include hives, swelling of the face and throat, difficulty breathing, a fast heartbeat, dizziness, and weakness. These would start a few minutes to a few hours after the vaccination. What should I do?  If you think it is a severe allergic reaction or other emergency that can't wait, call 9-1-1 or get to the nearest hospital. Otherwise, call your clinic. Afterward, the reaction should be reported to the Vaccine Adverse Event Reporting System (VAERS). Your doctor should file this report, or you can do it yourself through the VAERS web site at www.vaers.SamedayNews.es, or by calling 419-187-2076. VAERS does not give medical advice. 6. The National Vaccine Injury Compensation Program The [employer] Vaccine Injury Compensation Program (VICP) is a federal program that was created to compensate people who may have been injured by certain vaccines. Persons who believe they may have been injured by a vaccine can learn about the program and  about filing a claim by calling 703-427-3609 or visiting the [place] website at GoldCloset.com.ee. There is a time limit to file a claim for compensation. 7. How can I learn more?  Ask your healthcare provider. He or she can give you the vaccine package insert or suggest other sources of information.  Call your local or state health department.  Contact the Centers for Disease Control and Prevention (CDC):  Call (731)120-5928 (1-800-CDC-INFO) or  Visit CDC's website at http://hunter.com/ CDC Hepatitis A Vaccine VIS (02/13/2015)   This information is not intended to replace advice given to you by your health care provider. Make sure you discuss any questions you have with your health care provider.   Document Released: 05/07/2006 Document Revised: 04/03/2015 Document Reviewed: 03/02/2015 Elsevier Interactive Patient Education 2016 Elsevier Inc. Hepatitis B Vaccine: What You Need to Know 1. Why get vaccinated? Hepatitis B is a serious disease that affects the liver. It is caused by the hepatitis B virus. Hepatitis B can cause mild illness lasting a few weeks, or it can lead to a serious, lifelong illness. Hepatitis B virus infection can be either acute or chronic. Acute hepatitis B virus infection is  a short-term illness that occurs within the first 6 months after someone is exposed to the hepatitis B virus. This can lead to:  fever, fatigue, loss of appetite, nausea, and/or vomiting  jaundice (yellow skin or eyes, dark urine, clay-colored bowel movements)  pain in muscles, joints, and stomach Chronic hepatitis B virus infection is a long-term illness that occurs when the hepatitis B virus remains in a person's body. Most people who go on to develop chronic hepatitis B do not have symptoms, but it is still very serious and can lead to:  liver damage (cirrhosis)  liver cancer  death Chronically-infected people can spread hepatitis B virus to others, even if they do  not feel or look sick themselves. Up to 1.4 million people in the [place] may have chronic hepatitis B infection. About 90% of infants who get hepatitis B become chronically infected and about 1 out of 4 of them dies. Hepatitis B is spread when blood, semen, or other body fluid infected with the Hepatitis B virus enters the body of a person who is not infected. People can become infected with the virus through:  Birth (a baby whose mother is infected can be infected at or after birth)  Sharing items such as razors or toothbrushes with an infected person  Contact with the blood or open sores of an infected person  Sex with an infected partner  Sharing needles, syringes, or other drug-injection equipment  Exposure to blood from needlesticks or other sharp instruments Each year about 2,000 people in the [place] die from hepatitis B-related liver disease. Hepatitis B vaccine can prevent hepatitis B and its consequences, including liver cancer and cirrhosis. 2. Hepatitis B vaccine Hepatitis B vaccine is made from parts of the hepatitis B virus. It cannot cause hepatitis B infection. The vaccine is usually given as 3 or 4 shots over a 37-month period. Infants should get their first dose of hepatitis B vaccine at birth and will usually complete the series at 34 months of age. All children and adolescents younger than 53 years of age who have not yet gotten the vaccine should also be vaccinated. Hepatitis B vaccine is recommended for unvaccinated adults who are at risk for hepatitis B virus infection, including:  People whose sex partners have hepatitis B  Sexually active persons who are not in a long-term monogamous relationship  Persons seeking evaluation or treatment for a sexually transmitted disease  Men who have sexual contact with other men  People who share needles, syringes, or other drug-injection equipment  People who have household contact with someone infected with the  hepatitis B virus  Health care and public safety workers at risk for exposure to blood or body fluids  Residents and staff of facilities for developmentally disabled persons  Persons in correctional facilities  Victims of sexual assault or abuse  Travelers to regions with increased rates of hepatitis B  People with chronic liver disease, kidney disease, HIV infection, or diabetes  Anyone who wants to be protected from hepatitis B There are no known risks to getting hepatitis B vaccine at the same time as other vaccines. 3. Some people should not get this vaccine Tell the person who is giving the vaccine:  If the person getting the vaccine has any severe, life-threatening allergies. If you ever had a life-threatening allergic reaction after a dose of hepatitis B vaccine, or have a severe allergy to any part of this vaccine, you may be advised not to get vaccinated. Ask  your health care provider if you want information about vaccine components.  If the person getting the vaccine is not feeling well. If you have a mild illness, such as a cold, you can probably get the vaccine today. If you are moderately or severely ill, you should probably wait until you recover. Your doctor can advise you. 4. Risks of a vaccine reaction With any medicine, including vaccines, there is a chance of side effects. These are usually mild and go away on their own, but serious reactions are also possible. Most people who get hepatitis B vaccine do not have any problems with it. Minor problems following hepatitis B vaccine include:  soreness where the shot was given  temperature of 99.40F or higher If these problems occur, they usually begin soon after the shot and last 1 or 2 days. Your doctor can tell you more about these reactions. Other problems that could happen after this vaccine:  People sometimes faint after a medical procedure, including vaccination. Sitting or lying down for about 15 minutes can  help prevent fainting and injuries caused by a fall. Tell your provider if you feel dizzy, or have vision changes or ringing in the ears.  Some people get shoulder pain that can be more severe and longer-lasting than the more routine soreness that can follow injections. This happens very rarely.  Any medication can cause a severe allergic reaction. Such reactions from a vaccine are very rare, estimated at about 1 in a million doses, and would happen within a few minutes to a few hours after the vaccination. As with any medicine, there is a very remote chance of a vaccine causing a serious injury or death. The safety of vaccines is always being monitored. For more information, visit: http://www.aguilar.org/ 5. What if there is a serious problem? What should I look for?  Look for anything that concerns you, such as signs of a severe allergic reaction, very high fever, or unusual behavior. Signs of a severe allergic reaction can include hives, swelling of the face and throat, difficulty breathing, a fast heartbeat, dizziness, and weakness. These would start a few minutes to a few hours after the vaccination. What should I do?  If you think it is a severe allergic reaction or other emergency that can't wait, call 9-1-1 or get to the nearest hospital. Otherwise, call your clinic. Afterward, the reaction should be reported to the Vaccine Adverse Event Reporting System (VAERS). Your doctor should file this report, or you can do it yourself through the VAERS web site at www.vaers.SamedayNews.es, or by calling 7150051008. VAERS does not give medical advice. 6. The National Vaccine Injury Compensation Program The [employer] Vaccine Injury Compensation Program (VICP) is a federal program that was created to compensate people who may have been injured by certain vaccines. Persons who believe they may have been injured by a vaccine can learn about the program and about filing a claim by calling 443-629-2430 or  visiting the [place] website at GoldCloset.com.ee. There is a time limit to file a claim for compensation. 7. How can I learn more?  Ask your healthcare provider. He or she can give you the vaccine package insert or suggest other sources of information.  Call your local or state health department.  Contact the Centers for Disease Control and Prevention (CDC):  Call 3133084846 (1-800-CDC-INFO) or  Visit CDC's website at http://hunter.com/ CDC Hepatitis B VIS (02/13/2015)   This information is not intended to replace advice given to you by your health care  provider. Make sure you discuss any questions you have with your health care provider.   Document Released: 05/07/2006 Document Revised: 04/03/2015 Document Reviewed: 03/02/2015 Elsevier Interactive Patient Education [employer].

## 2016-04-24 NOTE — Addendum Note (Signed)
Addended by: Rudene Anda T on: 04/24/2016 09:48 AM   Modules accepted: Orders

## 2016-04-25 LAB — LIPID PANEL W/O CHOL/HDL RATIO
Cholesterol, Total: 271 mg/dL — ABNORMAL HIGH (ref 100–199)
HDL: 80 mg/dL (ref >39)
LDL CALC: 168 mg/dL — AB (ref 0–99)
TRIGLYCERIDES: 113 mg/dL (ref 0–149)
VLDL CHOLESTEROL CAL: 23 mg/dL (ref 5–40)

## 2016-04-25 LAB — COPPER, SERUM: Copper: 128 ug/dL (ref 72–166)

## 2016-04-25 LAB — HGB A1C W/O EAG: HEMOGLOBIN A1C: 5.8 % — AB (ref 4.8–5.6)

## 2016-04-25 LAB — PREALBUMIN: PREALBUMIN: 27 mg/dL (ref 10–36)

## 2016-04-27 ENCOUNTER — Telehealth: Payer: Self-pay | Admitting: Emergency Medicine

## 2016-04-27 NOTE — Telephone Encounter (Signed)
Lab results were faxed to Dr. Kreg Shropshire at [facility] per patient's request.

## 2016-06-02 ENCOUNTER — Ambulatory Visit (INDEPENDENT_AMBULATORY_CARE_PROVIDER_SITE_OTHER): Payer: Managed Care, Other (non HMO) | Admitting: Family Medicine

## 2016-06-02 ENCOUNTER — Encounter: Payer: Self-pay | Admitting: Family Medicine

## 2016-06-02 VITALS — BP 137/83 | HR 62 | Temp 99.0°F | Ht 65.2 in | Wt 231.8 lb

## 2016-06-02 DIAGNOSIS — M79605 Pain in left leg: Secondary | ICD-10-CM | POA: Diagnosis not present

## 2016-06-02 MED ORDER — CYCLOBENZAPRINE HCL 10 MG PO TABS: 10.0000 mg | ORAL_TABLET | Freq: Three times a day (TID) | ORAL | 0 refills | Status: DC | PRN

## 2016-06-02 MED ORDER — PREDNISONE 10 MG PO TABS: 10.0000 mg | ORAL_TABLET | Freq: Every day | ORAL | 0 refills | Status: DC
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
## 2016-06-02 NOTE — Patient Instructions (Signed)
Follow up as needed

## 2016-06-25 ENCOUNTER — Telehealth: Payer: Self-pay | Admitting: Family Medicine

## 2016-06-25 NOTE — Telephone Encounter (Signed)
Pt called would like to know if she should see an orthopedist. Stated she has a tingling and a numbness in her leg. Stated it is intermittent, feels like a cramp she can't get rid of. Please call pt and follow up. Thanks.

## 2016-06-26 NOTE — Telephone Encounter (Signed)
Patient needs a follow up. 

## 2016-07-01 ENCOUNTER — Ambulatory Visit (INDEPENDENT_AMBULATORY_CARE_PROVIDER_SITE_OTHER): Payer: Managed Care, Other (non HMO) | Admitting: Family Medicine

## 2016-07-01 ENCOUNTER — Encounter: Payer: Self-pay | Admitting: Family Medicine

## 2016-07-01 VITALS — BP 122/83 | HR 85 | Temp 97.8°F | Wt 238.0 lb

## 2016-07-01 DIAGNOSIS — M5442 Lumbago with sciatica, left side: Secondary | ICD-10-CM

## 2016-07-01 DIAGNOSIS — F4310 Post-traumatic stress disorder, unspecified: Secondary | ICD-10-CM

## 2016-07-01 MED ORDER — BUSPIRONE HCL 5 MG PO TABS
ORAL_TABLET | ORAL | 4 refills | Status: DC
Start: 2016-07-01 — End: 2016-12-31

## 2016-07-01 NOTE — Assessment & Plan Note (Signed)
Refill of buspar given today.
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
## 2016-09-23 ENCOUNTER — Encounter: Payer: Self-pay | Admitting: Physician Assistant

## 2016-09-23 ENCOUNTER — Ambulatory Visit: Payer: Self-pay | Admitting: Physician Assistant

## 2016-09-23 VITALS — BP 110/80 | HR 68 | Temp 97.7°F | Ht 65.0 in | Wt 238.0 lb

## 2016-09-23 DIAGNOSIS — Z0000 Encounter for general adult medical examination without abnormal findings: Secondary | ICD-10-CM

## 2016-09-23 MED ORDER — VITAMIN D (ERGOCALCIFEROL) 1.25 MG (50000 UNIT) PO CAPS: 50000.0000 [IU] | ORAL_CAPSULE | ORAL | 12 refills | Status: DC

## 2016-09-23 NOTE — Addendum Note (Signed)
Addended by: Rudene Anda T on: 09/23/2016 12:14 PM   Modules accepted: Orders

## 2016-09-23 NOTE — Progress Notes (Signed)
Patient has been scheduled to see Dr. Donnella Sham at [facility] on 11/19/2016 @ 2:30pm and her mammogram has been scheduled for 10/20/16 at 4:40pm.  Patient has been notified and has accepted the appointment.

## 2016-09-23 NOTE — Addendum Note (Signed)
Addended by: Rudene Anda T on: 09/23/2016 10:49 AM   Modules accepted: Orders

## 2016-09-23 NOTE — Progress Notes (Signed)
   Subjective:Biometric Physical    Patient ID: Sandra Mendez, female    DOB: Dec 02, 1955, 61 y.o.   MRN: [id number]  HPI Patient report for physical exam and refill of medication.   Review of Systems    Negative except for compliant Objective:   Physical Exam No acute distress. Obese. HEENT unremarkable. Neck supple w/o adenopathy. Lungs CTA. Heart RRR.       Assessment & Plan:well exam/medication refill

## 2016-09-28 LAB — CMP12+LP+TP+TSH+6AC+CBC/D/PLT
A/G RATIO: 1.9 (ref 1.2–2.2)
ALBUMIN: 4.3 g/dL (ref 3.6–4.8)
ALT: 12 IU/L (ref 0–32)
AST: 17 IU/L (ref 0–40)
Alkaline Phosphatase: 96 IU/L (ref 39–117)
BASOS ABS: 0 10*3/uL (ref 0.0–0.2)
BILIRUBIN TOTAL: 0.2 mg/dL (ref 0.0–1.2)
BUN/Creatinine Ratio: 18 (ref 12–28)
BUN: 14 mg/dL (ref 8–27)
Basos: 1 %
CHLORIDE: 102 mmol/L (ref 96–106)
CHOLESTEROL TOTAL: 268 mg/dL — AB (ref 100–199)
Calcium: 8.8 mg/dL (ref 8.7–10.3)
Chol/HDL Ratio: 3.6 ratio units (ref 0.0–4.4)
Creatinine, Ser: 0.78 mg/dL (ref 0.57–1.00)
EOS (ABSOLUTE): 0.1 10*3/uL (ref 0.0–0.4)
EOS: 2 %
ESTIMATED CHD RISK: 0.6 times avg. (ref 0.0–1.0)
FREE THYROXINE INDEX: 1.6 (ref 1.2–4.9)
GFR calc Af Amer: 94 mL/min/{1.73_m2} (ref >59)
GFR calc non Af Amer: 82 mL/min/{1.73_m2} (ref >59)
GGT: 20 IU/L (ref 0–60)
GLOBULIN, TOTAL: 2.3 g/dL (ref 1.5–4.5)
Glucose: 95 mg/dL (ref 65–99)
HDL: 75 mg/dL (ref >39)
HEMOGLOBIN: 13.4 g/dL (ref 11.1–15.9)
Hematocrit: 40 % (ref 34.0–46.6)
IMMATURE GRANULOCYTES: 0 %
IRON: 74 ug/dL (ref 27–139)
Immature Grans (Abs): 0 10*3/uL (ref 0.0–0.1)
LDH: 219 IU/L (ref 119–226)
LDL Calculated: 174 mg/dL — ABNORMAL HIGH (ref 0–99)
LYMPHS ABS: 1.2 10*3/uL (ref 0.7–3.1)
Lymphs: 30 %
MCH: 30 pg (ref 26.6–33.0)
MCHC: 33.5 g/dL (ref 31.5–35.7)
MCV: 90 fL (ref 79–97)
MONOS ABS: 0.3 10*3/uL (ref 0.1–0.9)
Monocytes: 8 %
Neutrophils Absolute: 2.5 10*3/uL (ref 1.4–7.0)
Neutrophils: 59 %
PLATELETS: 193 10*3/uL (ref 150–379)
Phosphorus: 3.1 mg/dL (ref 2.5–4.5)
Potassium: 4.3 mmol/L (ref 3.5–5.2)
RBC: 4.46 x10E6/uL (ref 3.77–5.28)
RDW: 14.6 % (ref 12.3–15.4)
Sodium: 142 mmol/L (ref 134–144)
T3 UPTAKE RATIO: 25 % (ref 24–39)
T4, Total: 6.4 ug/dL (ref 4.5–12.0)
TOTAL PROTEIN: 6.6 g/dL (ref 6.0–8.5)
TRIGLYCERIDES: 97 mg/dL (ref 0–149)
TSH: 3.16 u[IU]/mL (ref 0.450–4.500)
Uric Acid: 5.7 mg/dL (ref 2.5–7.1)
VLDL CHOLESTEROL CAL: 19 mg/dL (ref 5–40)
WBC: 4.2 10*3/uL (ref 3.4–10.8)

## 2016-09-28 LAB — VITAMIN D 25 HYDROXY (VIT D DEFICIENCY, FRACTURES): VIT D 25 HYDROXY: 27.1 ng/mL — AB (ref 30.0–100.0)

## 2016-09-28 LAB — HCV AB VERIFICATION: HCV ANTIBODY VERIFICATION: REACTIVE — AB

## 2016-09-28 LAB — HEPATITIS C ANTIBODY (REFLEX): HCV AB: 1 {s_co_ratio} — AB (ref 0.0–0.9)

## 2016-09-28 LAB — COMMENT3 - HEP PANEL

## 2016-09-28 LAB — HIV ANTIBODY (ROUTINE TESTING W REFLEX): HIV Screen 4th Generation wRfx: NONREACTIVE

## 2016-10-20 ENCOUNTER — Ambulatory Visit
Admission: RE | Admit: 2016-10-20 | Discharge: 2016-10-20 | Disposition: A | Discharge status: Home / Self Care | Payer: Managed Care, Other (non HMO) | Source: Ambulatory Visit | Attending: Physician Assistant | Admitting: Physician Assistant

## 2016-10-20 DIAGNOSIS — Z1231 Encounter for screening mammogram for malignant neoplasm of breast: Secondary | ICD-10-CM | POA: Diagnosis not present

## 2016-10-20 DIAGNOSIS — Z0000 Encounter for general adult medical examination without abnormal findings: Secondary | ICD-10-CM

## 2016-10-23 ENCOUNTER — Ambulatory Visit: Payer: Managed Care, Other (non HMO) | Admitting: Family Medicine

## 2016-11-19 ENCOUNTER — Other Ambulatory Visit: Payer: Self-pay

## 2016-11-20 ENCOUNTER — Ambulatory Visit: Payer: Managed Care, Other (non HMO) | Admitting: Family Medicine

## 2016-12-08 ENCOUNTER — Other Ambulatory Visit: Payer: Self-pay | Admitting: Physician Assistant

## 2016-12-08 ENCOUNTER — Other Ambulatory Visit: Payer: Self-pay

## 2016-12-16 LAB — HCV GT3 NS5A DRUG RESIST ASSAY

## 2016-12-16 LAB — HCV RT-PCR, QUANT (GRAPH): Hepatitis C Quantitation: NOT DETECTED IU/mL

## 2016-12-16 NOTE — Progress Notes (Addendum)
Patient came in to have blood drawn for testing.  Had to send the patient to [facility] to have the blood drawn there because the specimen requires special handling.  Patient wants results faxed to Stephens November, NP at [facility].   12/18/2016 @ 1343:Lab results were faxed to Donnelly Angelica, NP per patient's request.

## 2016-12-18 ENCOUNTER — Ambulatory Visit: Payer: Managed Care, Other (non HMO) | Admitting: Family Medicine

## 2016-12-31 ENCOUNTER — Telehealth: Payer: Self-pay | Admitting: Family Medicine

## 2016-12-31 MED ORDER — BUSPIRONE HCL 5 MG PO TABS: ORAL_TABLET | ORAL | 4 refills | Status: DC

## 2016-12-31 MED ORDER — CYCLOBENZAPRINE HCL 10 MG PO TABS: 10.0000 mg | ORAL_TABLET | Freq: Three times a day (TID) | ORAL | 0 refills | Status: DC | PRN

## 2016-12-31 NOTE — Telephone Encounter (Signed)
Patient is requesting a refill on her medication for Buspar.  Please Advise.  Thank you

## 2016-12-31 NOTE — Telephone Encounter (Signed)
Patient does need her flexeril.

## 2016-12-31 NOTE — Telephone Encounter (Signed)
I refilled her buspar, but that is not the muscle relaxer... Which does she need?

## 2017-01-11 ENCOUNTER — Encounter: Payer: Self-pay | Admitting: Family Medicine

## 2017-01-11 ENCOUNTER — Ambulatory Visit (INDEPENDENT_AMBULATORY_CARE_PROVIDER_SITE_OTHER): Payer: Managed Care, Other (non HMO) | Admitting: Family Medicine

## 2017-01-11 VITALS — BP 141/87 | HR 72 | Temp 97.8°F | Wt 239.6 lb

## 2017-01-11 DIAGNOSIS — F4310 Post-traumatic stress disorder, unspecified: Secondary | ICD-10-CM | POA: Diagnosis not present

## 2017-01-11 DIAGNOSIS — Z23 Encounter for immunization: Secondary | ICD-10-CM

## 2017-01-11 DIAGNOSIS — E782 Mixed hyperlipidemia: Secondary | ICD-10-CM | POA: Diagnosis not present

## 2017-01-11 DIAGNOSIS — R7301 Impaired fasting glucose: Secondary | ICD-10-CM

## 2017-01-11 DIAGNOSIS — E559 Vitamin D deficiency, unspecified: Secondary | ICD-10-CM

## 2017-01-11 DIAGNOSIS — Z9884 Bariatric surgery status: Secondary | ICD-10-CM

## 2017-01-11 DIAGNOSIS — Z6839 Body mass index (BMI) 39.0-39.9, adult: Secondary | ICD-10-CM | POA: Diagnosis not present

## 2017-01-11 MED ORDER — NALTREXONE-BUPROPION HCL ER 8-90 MG PO TB12: ORAL_TABLET | ORAL | 1 refills | Status: DC

## 2017-01-11 NOTE — Assessment & Plan Note (Signed)
With significant weight gain. Will start contrave. Recheck 1 month.

## 2017-01-11 NOTE — Patient Instructions (Addendum)
[email or web address]  To register for contrave savings card

## 2017-01-11 NOTE — Assessment & Plan Note (Signed)
Due for last 2 hep A and B vaccines- will return next week as we are out of hep B today.

## 2017-01-11 NOTE — Assessment & Plan Note (Signed)
Order for lab given today. Recheck levels. Await results.

## 2017-01-11 NOTE — Assessment & Plan Note (Signed)
Not under good control. Not doing great on diet and exercise. Will start contrave. Recheck 1 month. Call with any concerns.

## 2017-01-11 NOTE — Progress Notes (Signed)
BP (!) 141/87 (BP Location: Left Arm, Patient Position: Sitting, Cuff Size: Large)   Pulse 72   Temp 97.8 F (36.6 C)   Wt 239 lb 9.6 oz (108.7 kg)   SpO2 98%   BMI 39.87 kg/m    Subjective:    Patient ID: Sandra Mendez Mendez, female    DOB: 1955/10/08, 61 y.o.   MRN: [id number]  HPI: Sandra Mendez Mendez is a 61 y.o. female  Chief Complaint  Patient presents with  . Sandra Mendez Mendez    Patient would like to discuss Sandra Mendez loss medication.    Exposed to Hep C- negative viral load, has seen GI. Will monitor viral load at her physical annually.  Sandra Mendez Mendez Duration: chronic Previous attempts at Sandra Mendez loss: yes- diet, exercise, bariatric Mendez Complications of Sandra Mendez Mendez: Sandra Mendez, HLD Peak Sandra Mendez: current Sandra Mendez loss goal: 145-150 Sandra Mendez loss to date: none Requesting Sandra Mendez Mendez pharmacotherapy: yes Current Sandra Mendez loss supplements/medications: no Previous Sandra Mendez loss supplements/meds: hx of bariatric Mendez  Sandra Mendez Mendez Duration:controlled Anxious mood: yes  Excessive worrying: no Irritability: no  Sweating: no Nausea: no Palpitations:no Hyperventilation: no Panic attacks: no Agoraphobia: no  Obscessions/compulsions: no Depressed mood: yes Depression screen PHQ 2/9 03/19/2016  Decreased Interest 0  Down, Depressed, Hopeless 0  PHQ - 2 Score 0   Anhedonia: no Sandra Mendez changes: yes Insomnia: no   Hypersomnia: no Fatigue/loss of energy: no Feelings of worthlessness: no Feelings of guilt: no Impaired concentration/indecisiveness: no Suicidal ideations: no  Crying spells: no Recent Stressors/Life Changes: no   Relationship problems: no   Family stress: yes     Financial stress: no    Job stress: no    Recent death/loss: no  Impaired Fasting Glucose HbA1C:  Lab Results  Component Value Date   HGBA1C 5.8 (H) 04/24/2016   Duration of elevated blood sugar: chronic Polydipsia: yes Polyuria: yes Sandra Mendez change: yes Visual disturbance: no Glucose Monitoring:  no Diabetic Education: Not Completed Family history of diabetes: yes  Sandra Mendez Mendez Sandra Mendez Mendez status: Stable Satisfied with current treatment?  yes Side effects:  Not on anything Past cholesterol meds: none Supplements: none Aspirin:  no The 10-year ASCVD risk score Mikey Bussing DC Jr., et al., 2013) is: 9.9%   Values used to calculate the score:     Age: 52 years     Sex: Female     Is Non-Hispanic African American: Yes     Diabetic: No     Tobacco smoker: No     Systolic Blood Pressure: 660 mmHg     Is BP treated: No     HDL Cholesterol: 75 mg/dL     Total Cholesterol: 268 mg/dL Chest pain:  no Coronary artery disease:  no Family history CAD:  yes  Relevant past medical, surgical, family and social history reviewed and updated as indicated. Interim medical history since our last visit reviewed. Allergies and medications reviewed and updated.  Review of Systems  Constitutional: Negative.   Respiratory: Negative.   Cardiovascular: Negative.   Psychiatric/Behavioral: Negative.    Per HPI unless specifically indicated above     Objective:    BP (!) 141/87 (BP Location: Left Arm, Patient Position: Sitting, Cuff Size: Large)   Pulse 72   Temp 97.8 F (36.6 C)   Wt 239 lb 9.6 oz (108.7 kg)   SpO2 98%   BMI 39.87 kg/m   Wt Readings from Last 3 Encounters:  01/11/17 239 lb 9.6 oz (108.7 kg)  09/23/16 238 lb (108 kg)  07/01/16 238 lb (108 kg)  Physical Exam  Constitutional: She is oriented to person, place, and time. She appears well-developed and well-nourished. No distress.  HENT:  Head: Normocephalic and atraumatic.  Right Ear: Hearing normal.  Left Ear: Hearing normal.  Nose: Nose normal.  Eyes: Conjunctivae and lids are normal. Right eye exhibits no discharge. Left eye exhibits no discharge. No scleral icterus.  Cardiovascular: Normal rate, regular rhythm, normal heart sounds and intact distal pulses.  Exam reveals no gallop and no friction rub.   No murmur  heard. Pulmonary/Chest: Effort normal and breath sounds normal. No respiratory distress. She has no wheezes. She has no rales. She exhibits no tenderness.  Musculoskeletal: Normal range of motion.  Neurological: She is alert and oriented to person, place, and time.  Skin: Skin is warm, dry and intact. No rash noted. She is not diaphoretic. No erythema. No pallor.  Psychiatric: She has a normal mood and affect. Her speech is normal and behavior is normal. Judgment and thought content normal. Cognition and memory are normal.  Nursing note and vitals reviewed.   Results for orders placed or performed in visit on 01/11/17  Fecal Occult Blood, Guaiac  Result Value Ref Range   Fecal Occult Blood Negative       Assessment & Plan:   Problem List Items Addressed This Visit      Endocrine   Sandra Mendez (impaired fasting glucose)    Order for lab given today. Recheck levels. Await results.       Relevant Orders   Bayer DCA Hb A1c Waived   Comprehensive metabolic panel   Microalbumin, Urine Waived     Other   Sandra Mendez Mendez    Order for lab given today. Recheck levels. Await results.       Relevant Orders   Comprehensive metabolic panel   Lipid Panel w/o Chol/HDL Ratio   Sandra Mendez Mendez    Order for lab given today. Recheck levels. Await results.       Relevant Orders   Sandra Mendez D 25 Hydroxy (Vit-D Mendez, Fractures)   Sandra Mendez Mendez    Due for last 2 hep A and B vaccines- will return next week as we are out of hep B today.      Relevant Orders   Mendez B vaccine adult IM   Mendez A vaccine adult IM   Sandra Mendez Mendez    With significant Sandra Mendez Mendez. Will start contrave. Recheck 1 month.       Sandra Mendez Mendez - Primary    Not under good control. Not doing great on diet and exercise. Will start contrave. Recheck 1 month. Call with any concerns.       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           Follow up plan: Return in about 4 weeks (around 02/08/2017) for (if on contrave) Physical and Follow up.

## 2017-01-11 NOTE — Assessment & Plan Note (Signed)
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

## 2017-01-13 ENCOUNTER — Telehealth: Payer: Self-pay | Admitting: Family Medicine

## 2017-01-13 NOTE — Telephone Encounter (Signed)
Patient said Dr Wynetta Emery wanted her to let her know if she decided the Contrave was to expensive and she would call in Wellbutrin instead.  She would like the Wellbutrin called into  [facility] on [address]  Thank you

## 2017-01-13 NOTE — Telephone Encounter (Signed)
Routing to provider  

## 2017-01-14 MED ORDER — BUPROPION HCL ER (SR) 150 MG PO TB12: ORAL_TABLET | ORAL | 3 refills | Status: DC

## 2017-01-14 NOTE — Telephone Encounter (Signed)
Rx sent to his pharmacy

## 2017-01-15 ENCOUNTER — Other Ambulatory Visit: Payer: Self-pay

## 2017-01-15 DIAGNOSIS — Z299 Encounter for prophylactic measures, unspecified: Secondary | ICD-10-CM

## 2017-01-15 NOTE — Progress Notes (Signed)
Patient came in to have blood drawn for testing per Dr. Megan Johnson's orders. 

## 2017-01-16 LAB — CMP12+LP+TP+TSH+6AC+CBC/D/PLT
ALBUMIN: 4.4 g/dL (ref 3.6–4.8)
ALK PHOS: 95 IU/L (ref 39–117)
ALT: 13 IU/L (ref 0–32)
AST: 23 IU/L (ref 0–40)
Albumin/Globulin Ratio: 2 (ref 1.2–2.2)
BASOS: 0 %
BILIRUBIN TOTAL: 0.3 mg/dL (ref 0.0–1.2)
BUN/Creatinine Ratio: 13 (ref 12–28)
BUN: 11 mg/dL (ref 8–27)
Basophils Absolute: 0 10*3/uL (ref 0.0–0.2)
CHOLESTEROL TOTAL: 263 mg/dL — AB (ref 100–199)
Calcium: 8.9 mg/dL (ref 8.7–10.3)
Chloride: 105 mmol/L (ref 96–106)
Chol/HDL Ratio: 3.8 ratio (ref 0.0–4.4)
Creatinine, Ser: 0.83 mg/dL (ref 0.57–1.00)
EOS (ABSOLUTE): 0.1 10*3/uL (ref 0.0–0.4)
EOS: 3 %
Estimated CHD Risk: 0.7 times avg. (ref 0.0–1.0)
FREE THYROXINE INDEX: 1.6 (ref 1.2–4.9)
GFR calc Af Amer: 87 mL/min/{1.73_m2} (ref >59)
GFR calc non Af Amer: 75 mL/min/{1.73_m2} (ref >59)
GGT: 21 IU/L (ref 0–60)
GLOBULIN, TOTAL: 2.2 g/dL (ref 1.5–4.5)
Glucose: 85 mg/dL (ref 65–99)
HDL: 70 mg/dL (ref >39)
HEMATOCRIT: 40.4 % (ref 34.0–46.6)
HEMOGLOBIN: 13.4 g/dL (ref 11.1–15.9)
IMMATURE GRANS (ABS): 0 10*3/uL (ref 0.0–0.1)
IMMATURE GRANULOCYTES: 0 %
Iron: 90 ug/dL (ref 27–139)
LDH: 215 IU/L (ref 119–226)
LDL CALC: 174 mg/dL — AB (ref 0–99)
LYMPHS: 36 %
Lymphocytes Absolute: 1.4 10*3/uL (ref 0.7–3.1)
MCH: 30.6 pg (ref 26.6–33.0)
MCHC: 33.2 g/dL (ref 31.5–35.7)
MCV: 92 fL (ref 79–97)
MONOS ABS: 0.2 10*3/uL (ref 0.1–0.9)
Monocytes: 6 %
NEUTROS PCT: 55 %
Neutrophils Absolute: 2.1 10*3/uL (ref 1.4–7.0)
Phosphorus: 3.2 mg/dL (ref 2.5–4.5)
Platelets: 188 10*3/uL (ref 150–379)
Potassium: 4.4 mmol/L (ref 3.5–5.2)
RBC: 4.38 x10E6/uL (ref 3.77–5.28)
RDW: 14 % (ref 12.3–15.4)
SODIUM: 143 mmol/L (ref 134–144)
T3 Uptake Ratio: 25 % (ref 24–39)
T4, Total: 6.3 ug/dL (ref 4.5–12.0)
TRIGLYCERIDES: 97 mg/dL (ref 0–149)
TSH: 4.66 u[IU]/mL — ABNORMAL HIGH (ref 0.450–4.500)
Total Protein: 6.6 g/dL (ref 6.0–8.5)
Uric Acid: 5.2 mg/dL (ref 2.5–7.1)
VLDL CHOLESTEROL CAL: 19 mg/dL (ref 5–40)
WBC: 3.9 10*3/uL (ref 3.4–10.8)

## 2017-01-16 LAB — VITAMIN D 25 HYDROXY (VIT D DEFICIENCY, FRACTURES): Vit D, 25-Hydroxy: 21 ng/mL — ABNORMAL LOW (ref 30.0–100.0)

## 2017-01-16 LAB — HGB A1C W/O EAG: Hgb A1c MFr Bld: 5.8 % — ABNORMAL HIGH (ref 4.8–5.6)

## 2017-01-16 LAB — MICROALBUMIN / CREATININE URINE RATIO
CREATININE, UR: 158.6 mg/dL
MICROALBUM., U, RANDOM: 3.7 ug/mL
Microalb/Creat Ratio: 2.3 mg/g creat (ref 0.0–30.0)

## 2017-01-18 ENCOUNTER — Other Ambulatory Visit: Payer: Self-pay | Admitting: Family Medicine

## 2017-01-18 DIAGNOSIS — R7989 Other specified abnormal findings of blood chemistry: Secondary | ICD-10-CM

## 2017-01-19 ENCOUNTER — Ambulatory Visit: Payer: Managed Care, Other (non HMO) | Admitting: Family Medicine

## 2017-02-09 ENCOUNTER — Ambulatory Visit: Payer: Managed Care, Other (non HMO) | Admitting: Family Medicine

## 2017-03-23 ENCOUNTER — Encounter: Payer: Managed Care, Other (non HMO) | Admitting: Family Medicine

## 2017-04-20 ENCOUNTER — Encounter: Payer: Self-pay | Admitting: *Deleted

## 2017-04-21 ENCOUNTER — Ambulatory Visit: Payer: Managed Care, Other (non HMO) | Admitting: Anesthesiology

## 2017-04-21 ENCOUNTER — Ambulatory Visit
Admission: RE | Admit: 2017-04-21 | Discharge: 2017-04-21 | Disposition: A | Discharge status: Home / Self Care | Payer: Managed Care, Other (non HMO) | Source: Ambulatory Visit | Attending: Internal Medicine | Admitting: Internal Medicine

## 2017-04-21 ENCOUNTER — Encounter
Admission: RE | Disposition: A | Discharge status: Home / Self Care | Payer: Self-pay | Source: Ambulatory Visit | Attending: Internal Medicine

## 2017-04-21 DIAGNOSIS — Z6841 Body Mass Index (BMI) 40.0 and over, adult: Secondary | ICD-10-CM | POA: Insufficient documentation

## 2017-04-21 DIAGNOSIS — K640 First degree hemorrhoids: Secondary | ICD-10-CM | POA: Diagnosis not present

## 2017-04-21 DIAGNOSIS — E669 Obesity, unspecified: Secondary | ICD-10-CM | POA: Insufficient documentation

## 2017-04-21 DIAGNOSIS — Z881 Allergy status to other antibiotic agents status: Secondary | ICD-10-CM | POA: Insufficient documentation

## 2017-04-21 DIAGNOSIS — Z880 Allergy status to penicillin: Secondary | ICD-10-CM | POA: Insufficient documentation

## 2017-04-21 DIAGNOSIS — E559 Vitamin D deficiency, unspecified: Secondary | ICD-10-CM | POA: Insufficient documentation

## 2017-04-21 DIAGNOSIS — Z1211 Encounter for screening for malignant neoplasm of colon: Secondary | ICD-10-CM | POA: Insufficient documentation

## 2017-04-21 DIAGNOSIS — Z86010 Personal history of colonic polyps: Secondary | ICD-10-CM | POA: Insufficient documentation

## 2017-04-21 DIAGNOSIS — Z800 Family history of malignant neoplasm of digestive organs: Secondary | ICD-10-CM | POA: Insufficient documentation

## 2017-04-21 HISTORY — DX: Vitamin D deficiency, unspecified: E55.9

## 2017-04-21 HISTORY — PX: COLONOSCOPY WITH PROPOFOL: SHX5780

## 2017-04-21 HISTORY — DX: Obesity, unspecified: E66.9

## 2017-04-21 LAB — HM COLONOSCOPY

## 2017-04-21 SURGERY — COLONOSCOPY WITH PROPOFOL
Anesthesia: General

## 2017-04-21 MED ORDER — PROPOFOL 10 MG/ML IV BOLUS
INTRAVENOUS | Status: AC
  Filled 2017-04-21: qty 20

## 2017-04-21 MED ORDER — EPHEDRINE SULFATE 50 MG/ML IJ SOLN
INTRAMUSCULAR | Status: DC | PRN
  Administered 2017-04-21: 10 mg via INTRAVENOUS

## 2017-04-21 MED ORDER — PROPOFOL 500 MG/50ML IV EMUL
INTRAVENOUS | Status: DC | PRN
  Administered 2017-04-21: 120 ug/kg/min via INTRAVENOUS

## 2017-04-21 MED ORDER — SODIUM CHLORIDE 0.9 % IV SOLN
INTRAVENOUS | Status: DC
  Administered 2017-04-21: 11:00:00 via INTRAVENOUS

## 2017-04-21 MED ORDER — GLYCOPYRROLATE 0.2 MG/ML IJ SOLN
INTRAMUSCULAR | Status: AC
  Filled 2017-04-21: qty 1

## 2017-04-21 MED ORDER — MIDAZOLAM HCL 2 MG/2ML IJ SOLN
INTRAMUSCULAR | Status: DC | PRN
  Administered 2017-04-21: 2 mg via INTRAVENOUS

## 2017-04-21 MED ORDER — GLYCOPYRROLATE 0.2 MG/ML IJ SOLN
INTRAMUSCULAR | Status: DC | PRN
  Administered 2017-04-21: 0.2 mg via INTRAVENOUS

## 2017-04-21 MED ORDER — FENTANYL CITRATE (PF) 100 MCG/2ML IJ SOLN
INTRAMUSCULAR | Status: AC
  Filled 2017-04-21: qty 2

## 2017-04-21 MED ORDER — FENTANYL CITRATE (PF) 100 MCG/2ML IJ SOLN
INTRAMUSCULAR | Status: DC | PRN
  Administered 2017-04-21: 50 ug via INTRAVENOUS

## 2017-04-21 MED ORDER — MIDAZOLAM HCL 2 MG/2ML IJ SOLN
INTRAMUSCULAR | Status: AC
  Filled 2017-04-21: qty 2

## 2017-04-21 MED ORDER — PROPOFOL 500 MG/50ML IV EMUL
INTRAVENOUS | Status: AC
  Filled 2017-04-21: qty 50

## 2017-04-21 MED ORDER — EPHEDRINE SULFATE 50 MG/ML IJ SOLN
INTRAMUSCULAR | Status: AC
  Filled 2017-04-21: qty 1

## 2017-04-21 NOTE — Anesthesia Procedure Notes (Signed)
Performed by: COOK-MARTIN, Larie Mathes Pre-anesthesia Checklist: Patient identified, Emergency Drugs available, Suction available, Patient being monitored and Timeout performed Patient Re-evaluated:Patient Re-evaluated prior to induction Oxygen Delivery Method: Nasal cannula Preoxygenation: Pre-oxygenation with 100% oxygen Induction Type: IV induction Ventilation: Oral airway inserted - appropriate to patient size Placement Confirmation: positive ETCO2 and CO2 detector       

## 2017-04-21 NOTE — Anesthesia Post-op Follow-up Note (Signed)
Anesthesia QCDR form completed.        

## 2017-04-21 NOTE — H&P (Signed)
Outpatient short stay form Pre-procedure 04/21/2017 3:01 PM Johnnathan Hagemeister K. Alice Reichert, M.D.  Primary Physician: Dr. Park Liter  Reason for visit:  Personal hx of colon polyps, Family hx of colon cancer.  History of present illness:  61 y/o female has personal hx of tubular adenomatous polyps and family hx of colon cancer in her brother. Patient presents for colonoscopy for screening/surveillance. The patient denies complaints of abdominal pain, significant change in bowel habits, or rectal bleeding.     Current Facility-Administered Medications:  .  0.9 %  sodium chloride infusion, , Intravenous, Continuous, Lollie Sails, MD, Last Rate: 20 mL/hr at 04/21/17 1111  Current Outpatient Prescriptions:  Marland Kitchen  Vitamin D, Ergocalciferol, (DRISDOL) 50000 units CAPS capsule, Take 1 capsule (50,000 Units total) by mouth every 7 (seven) days., Disp: 30 capsule, Rfl: 12  No prescriptions prior to admission.     Allergies  Allergen Reactions  . Erythromycin Rash  . Penicillins Rash     Past Medical History:  Diagnosis Date  . History of colon polyps   . Obesity   . Vitamin D deficiency     Review of systems:   As in HPI. Focused 8 organ ROS otherwise negative.   Physical Exam  Gen: NAD. Appears comfortable.  HEENT: [place]/AT. PERRLA. Normal external ear exam.  Chest: CTA, no wheezes.  CV: RR nl S1, S2. No gallops.  Abd: soft, nt, nd. BS+  Ext: no edema. Pulses 2+  Neuro: Alert and oriented. Judgement appears normal. Nonfocal.    Planned procedures: Proceed with colonoscopy. The patient understands the nature of the planned procedure, indications, risks, alternatives and potential complications including but not limited to bleeding, infection, perforation, damage to internal organs and possible oversedation/side effects from anesthesia. The patient agrees and gives consent to proceed.  Please refer to procedure notes for findings, recommendations and patient  disposition/instructions.    Tahlia Deamer K. Alice Reichert, M.D. Gastroenterology 04/21/2017  3:01 PM

## 2017-04-21 NOTE — Anesthesia Postprocedure Evaluation (Signed)
Anesthesia Post Note  Patient: Sandra Mendez  Procedure(s) Performed: Procedure(s) (LRB): COLONOSCOPY WITH PROPOFOL (N/A)  Patient location during evaluation: Endoscopy Anesthesia Type: General Level of consciousness: awake and alert Pain management: pain level controlled Vital Signs Assessment: post-procedure vital signs reviewed and stable Respiratory status: spontaneous breathing and respiratory function stable Cardiovascular status: stable Anesthetic complications: no     Last Vitals:  Vitals:   04/21/17 1105 04/21/17 1207  BP: (!) 179/90 (!) 93/46  Resp: (!) 22   Temp: (!) 36.2 C (!) 36.2 C  SpO2: 100%     Last Pain:  Vitals:   04/21/17 1207  TempSrc: Tympanic                 KEPHART,WILLIAM K

## 2017-04-21 NOTE — Op Note (Addendum)
[facility] Patient Name: Sandra Mendez Procedure Date: 04/21/2017 11:24 AM MRN: [id number] Account #: [id number] Date of Birth: Oct 06, 1955 Admit Type: Outpatient Age: 61 Room: [facility] ENDO ROOM 1 Gender: Female Note Status: Finalized Procedure:            Colonoscopy Indications:          Screening in patient at increased risk: Family history                        of 1st-degree relative with colorectal cancer, High                        risk colon cancer surveillance: Personal history of                        multiple (3 or more) adenomas Providers:            Lorie Apley K. Alice Reichert MD, MD Referring MD:         Valerie Roys (Referring MD) Medicines:            Propofol per Anesthesia Complications:        No immediate complications. Procedure:            Pre-Anesthesia Assessment:                       - ASA Grade Assessment: III - A patient with severe                        systemic disease.                       - After reviewing the risks and benefits, the patient                        was deemed in satisfactory condition to undergo the                        procedure.                       After obtaining informed consent, the colonoscope was                        passed under direct vision. Throughout the procedure,                        the patient's blood pressure, pulse, and oxygen                        saturations were monitored continuously. The Olympus                        CF-H180AL colonoscope ( S#: [id number] ) was introduced                        through the anus and advanced to the the terminal                        ileum, with identification of the appendiceal orifice  and IC valve. The terminal ileum, ileocecal valve,                        appendiceal orifice, and rectum were photographed. The                        entire colon was examined. The colonoscopy was                        technically  difficult and complex due to significant                        looping. Successful completion of the procedure was                        aided by applying abdominal pressure. The patient                        tolerated the procedure well. The quality of the bowel                        preparation was adequate. Findings:      The perianal and digital rectal examinations were normal.      Non-bleeding internal hemorrhoids were found during retroflexion. The       hemorrhoids were Grade I (internal hemorrhoids that do not prolapse). Impression:           - Non-bleeding internal hemorrhoids.                       - No specimens collected. Recommendation:       - Return to my office PRN.                       - Patient has a contact number available for                        emergencies. The signs and symptoms of potential                        delayed complications were discussed with the patient.                        Return to normal activities tomorrow. Written discharge                        instructions were provided to the patient.                       - Resume previous diet.                       - Continue present medications.                       - Repeat colonoscopy in 5 years for surveillance. Procedure Code(s):    --- Professional ---                       Y3016, Colorectal cancer screening; colonoscopy on  individual at high risk Diagnosis Code(s):    --- Professional ---                       Z80.0, Family history of malignant neoplasm of                        digestive organs                       Z86.010, Personal history of colonic polyps                       K64.0, First degree hemorrhoids CPT copyright 2016 American Medical Association. All rights reserved. The codes documented in this report are preliminary and upon coder review may  be revised to meet current compliance requirements. Efrain Sella MD, MD 04/21/2017 12:07:26 PM This  report has been signed electronically. Number of Addenda: 0 Note Initiated On: 04/21/2017 11:24 AM Scope Withdrawal Time: 0 hours 6 minutes 21 seconds  Total Procedure Duration: 0 hours 23 minutes 23 seconds       [facility]

## 2017-04-21 NOTE — Anesthesia Preprocedure Evaluation (Signed)
Anesthesia Evaluation  Patient identified by MRN, date of birth, ID band Patient awake    Reviewed: Allergy & Precautions, NPO status , Patient's Chart, lab work & pertinent test results  History of Anesthesia Complications Negative for: history of anesthetic complications  Airway Mallampati: II       Dental   Pulmonary neg sleep apnea, neg COPD,           Cardiovascular (-) hypertension(-) Past MI and (-) CHF (-) dysrhythmias (-) Valvular Problems/Murmurs     Neuro/Psych neg Seizures Anxiety Depression    GI/Hepatic Neg liver ROS, neg GERD  ,  Endo/Other  neg diabetes  Renal/GU negative Renal ROS     Musculoskeletal   Abdominal   Peds  Hematology   Anesthesia Other Findings   Reproductive/Obstetrics                            Anesthesia Physical Anesthesia Plan  ASA: II  Anesthesia Plan: General   Post-op Pain Management:    Induction: Intravenous  PONV Risk Score and Plan:   Airway Management Planned:   Additional Equipment:   Intra-op Plan:   Post-operative Plan:   Informed Consent: I have reviewed the patients History and Physical, chart, labs and discussed the procedure including the risks, benefits and alternatives for the proposed anesthesia with the patient or authorized representative who has indicated his/her understanding and acceptance.     Plan Discussed with:   Anesthesia Plan Comments:         Anesthesia Quick Evaluation

## 2017-04-21 NOTE — Transfer of Care (Signed)
Immediate Anesthesia Transfer of Care Note  Patient: Sandra Mendez  Procedure(s) Performed: Procedure(s): COLONOSCOPY WITH PROPOFOL (N/A)  Patient Location: PACU  Anesthesia Type:General  Level of Consciousness: awake and sedated  Airway & Oxygen Therapy: Patient Spontanous Breathing and Patient connected to nasal cannula oxygen  Post-op Assessment: Report given to RN and Post -op Vital signs reviewed and stable  Post vital signs: Reviewed and stable  Last Vitals:  Vitals:   04/21/17 1105  BP: (!) 179/90  Resp: (!) 22  Temp: (!) 36.2 C  SpO2: 100%    Last Pain:  Vitals:   04/21/17 1105  TempSrc: Tympanic         Complications: No apparent anesthesia complications

## 2017-04-22 ENCOUNTER — Encounter: Payer: Self-pay | Admitting: Internal Medicine

## 2017-04-23 ENCOUNTER — Encounter: Payer: Managed Care, Other (non HMO) | Admitting: Family Medicine

## 2017-05-03 ENCOUNTER — Encounter: Payer: Self-pay | Admitting: Family Medicine

## 2017-05-31 ENCOUNTER — Encounter: Payer: Self-pay | Admitting: Family Medicine

## 2017-06-23 ENCOUNTER — Encounter: Payer: Managed Care, Other (non HMO) | Admitting: Family Medicine

## 2017-07-29 ENCOUNTER — Encounter: Payer: Managed Care, Other (non HMO) | Admitting: Family Medicine

## 2017-09-03 ENCOUNTER — Encounter: Payer: Managed Care, Other (non HMO) | Admitting: Family Medicine

## 2017-09-10 ENCOUNTER — Encounter: Payer: Managed Care, Other (non HMO) | Admitting: Family Medicine

## 2017-11-15 ENCOUNTER — Ambulatory Visit: Payer: Self-pay | Admitting: Family Medicine

## 2017-11-15 VITALS — BP 138/84 | HR 69 | Resp 16

## 2017-11-15 DIAGNOSIS — E559 Vitamin D deficiency, unspecified: Secondary | ICD-10-CM

## 2017-11-15 DIAGNOSIS — Z760 Encounter for issue of repeat prescription: Secondary | ICD-10-CM

## 2017-11-15 DIAGNOSIS — Z0189 Encounter for other specified special examinations: Secondary | ICD-10-CM

## 2017-11-15 DIAGNOSIS — Z008 Encounter for other general examination: Secondary | ICD-10-CM

## 2017-11-15 NOTE — Progress Notes (Signed)
Subjective: Vitamin D refill Patient presents today requesting refill for her vitamin D supplement.  Patient reports being on this for the last 2 years.  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  Patient denies any symptoms related to vitamin D deficiency or excess.  Denies any side/adverse effects related to vitamin D supplementation.  Patient denies any symptoms or concerns today.  Patient sees Dr. Wynetta Emery for primary care but was being treated in this clinic for vitamin D deficiency.  Assessment:   Vitamin D insufficiency   Plan:   Checking vitamin D level today and will prescribe supplementation if indicated.  Informed patient that long-term monitoring and treatment of chronic conditions such as this are more appropriate from a primary care provider but that we would do it today only since she was previously treated in our clinic for this.  Also ordered lipid panel today since the patient is returning for her annual biometric screening next week and do not want her to have to have a second lab draw.  Encouraged regular visits with primary care provider.

## 2017-11-16 LAB — LIPID PANEL
Chol/HDL Ratio: 3.4 ratio (ref 0.0–4.4)
Cholesterol, Total: 232 mg/dL — ABNORMAL HIGH (ref 100–199)
HDL: 69 mg/dL (ref >39)
LDL CALC: 121 mg/dL — AB (ref 0–99)
Triglycerides: 208 mg/dL — ABNORMAL HIGH (ref 0–149)
VLDL CHOLESTEROL CAL: 42 mg/dL — AB (ref 5–40)

## 2017-11-22 LAB — VITAMIN D 25 HYDROXY (VIT D DEFICIENCY, FRACTURES)

## 2017-11-22 NOTE — Progress Notes (Signed)
Will you follow up with [place] again regarding the pending vitamin D level?

## 2017-11-23 ENCOUNTER — Ambulatory Visit: Payer: Self-pay | Admitting: Physician Assistant

## 2017-11-23 VITALS — BP 163/79 | HR 78 | Resp 16 | Ht 65.0 in | Wt 246.0 lb

## 2017-11-23 DIAGNOSIS — Z008 Encounter for other general examination: Secondary | ICD-10-CM

## 2017-11-23 DIAGNOSIS — Z0189 Encounter for other specified special examinations: Principal | ICD-10-CM

## 2017-11-23 DIAGNOSIS — E559 Vitamin D deficiency, unspecified: Secondary | ICD-10-CM

## 2017-11-23 LAB — VITAMIN D 25 HYDROXY (VIT D DEFICIENCY, FRACTURES)

## 2017-11-23 LAB — GLUCOSE, POCT (MANUAL RESULT ENTRY): POC Glucose: 104 mg/dl — AB (ref 70–99)

## 2017-11-24 ENCOUNTER — Other Ambulatory Visit: Payer: Self-pay | Admitting: Family Medicine

## 2017-11-24 DIAGNOSIS — E559 Vitamin D deficiency, unspecified: Secondary | ICD-10-CM

## 2017-11-24 DIAGNOSIS — Z0000 Encounter for general adult medical examination without abnormal findings: Secondary | ICD-10-CM

## 2017-11-24 LAB — VITAMIN D 25 HYDROXY (VIT D DEFICIENCY, FRACTURES): VIT D 25 HYDROXY: 17.2 ng/mL — AB (ref 30.0–100.0)

## 2017-11-24 MED ORDER — VITAMIN D (ERGOCALCIFEROL) 1.25 MG (50000 UNIT) PO CAPS
50000.0000 [IU] | ORAL_CAPSULE | ORAL | 0 refills | Status: AC
Start: 2017-11-24 — End: 2018-01-13

## 2017-11-24 NOTE — Progress Notes (Signed)
Dear Sandra Mendez, I wanted to let you know that your blood work came back.  Your vitamin D level is decreased at 17.2, normal values are between 30 and 100.  I sent a refill in to your pharmacy for your vitamin D supplement to take once a week for the next 8 weeks.  Please follow-up with your primary care provider for future monitoring and additional supplementation if needed. Your lipid panel for your biometric screening also has resulted.  Your total cholesterol is elevated at 232, normal values are between 100 and 199.  Your triglyceride level is elevated at 208, normal values are between 0 and 149.  Your VLDL cholesterol is elevated at 42, normal values are between 5 and 40. Your HDL cholesterol ("good cholesterol") is normal at 69, normal values are above 39.  Your LDL cholesterol ("bad cholesterol") is elevated at 121, normal values are below 99.  Your cholesterol/HDL ratio is normal at 3.4, normal values are between 0 and 4.4 for women or 0 and 5 from men.  These abnormal values increase your risk for cardiovascular disease.  I wanted you to be aware of these results so that you can discuss this with your primary care provider at your next visit.

## 2017-12-24 ENCOUNTER — Telehealth: Payer: Self-pay | Admitting: Gastroenterology

## 2017-12-24 NOTE — Telephone Encounter (Signed)
Wife is calling for her husband. This should be a note in his chart.

## 2017-12-24 NOTE — Telephone Encounter (Signed)
PT  Wife left vm she still wants to know why pt was put on heart rx please call her

## 2018-01-19 ENCOUNTER — Encounter: Payer: Managed Care, Other (non HMO) | Admitting: Family Medicine

## 2018-03-25 ENCOUNTER — Ambulatory Visit (INDEPENDENT_AMBULATORY_CARE_PROVIDER_SITE_OTHER): Payer: Managed Care, Other (non HMO) | Admitting: Family Medicine

## 2018-03-25 ENCOUNTER — Encounter

## 2018-03-25 ENCOUNTER — Encounter: Payer: Self-pay | Admitting: Family Medicine

## 2018-03-25 VITALS — BP 125/74 | HR 63 | Temp 98.3°F | Ht 64.7 in | Wt 236.5 lb

## 2018-03-25 DIAGNOSIS — R7301 Impaired fasting glucose: Secondary | ICD-10-CM

## 2018-03-25 DIAGNOSIS — Z0000 Encounter for general adult medical examination without abnormal findings: Secondary | ICD-10-CM

## 2018-03-25 DIAGNOSIS — E559 Vitamin D deficiency, unspecified: Secondary | ICD-10-CM | POA: Diagnosis not present

## 2018-03-25 DIAGNOSIS — E782 Mixed hyperlipidemia: Secondary | ICD-10-CM

## 2018-03-25 DIAGNOSIS — Z6839 Body mass index (BMI) 39.0-39.9, adult: Secondary | ICD-10-CM

## 2018-03-25 DIAGNOSIS — Z1239 Encounter for other screening for malignant neoplasm of breast: Secondary | ICD-10-CM

## 2018-03-25 MED ORDER — BUSPIRONE HCL 5 MG PO TABS: ORAL_TABLET | ORAL | 4 refills | Status: DC

## 2018-03-25 NOTE — Patient Instructions (Addendum)
[facility]  Address: [address] [place], [place], [place] [address]  Phone: [phone]   Health Maintenance for Postmenopausal Women Menopause is a normal process in which your reproductive ability comes to an end. This process happens gradually over a span of months to years, usually between the ages of 68 and 80. Menopause is complete when you have missed 12 consecutive menstrual periods. It is important to talk with your health care provider about some of the most common conditions that affect postmenopausal women, such as heart disease, cancer, and bone loss (osteoporosis). Adopting a healthy lifestyle and getting preventive care can help to promote your health and wellness. Those actions can also lower your chances of developing some of these common conditions. What should I know about menopause? During menopause, you may experience a number of symptoms, such as:  Moderate-to-severe hot flashes.  Night sweats.  Decrease in sex drive.  Mood swings.  Headaches.  Tiredness.  Irritability.  Memory problems.  Insomnia.  Choosing to treat or not to treat menopausal changes is an individual decision that you make with your health care provider. What should I know about hormone replacement therapy and supplements? Hormone therapy products are effective for treating symptoms that are associated with menopause, such as hot flashes and night sweats. Hormone replacement carries certain risks, especially as you become older. If you are thinking about using estrogen or estrogen with progestin treatments, discuss the benefits and risks with your health care provider. What should I know about heart disease and stroke? Heart disease, heart attack, and stroke become more likely as you age. This may be due, in part, to the hormonal changes that your body experiences during menopause. These can affect how your body processes dietary fats, triglycerides, and  cholesterol. Heart attack and stroke are both medical emergencies. There are many things that you can do to help prevent heart disease and stroke:  Have your blood pressure checked at least every 1-2 years. High blood pressure causes heart disease and increases the risk of stroke.  If you are 20-77 years old, ask your health care provider if you should take aspirin to prevent a heart attack or a stroke.  Do not use any tobacco products, including cigarettes, chewing tobacco, or electronic cigarettes. If you need help quitting, ask your health care provider.  It is important to eat a healthy diet and maintain a healthy weight. ? Be sure to include plenty of vegetables, fruits, low-fat dairy products, and lean protein. ? Avoid eating foods that are high in solid fats, added sugars, or salt (sodium).  Get regular exercise. This is one of the most important things that you can do for your health. ? Try to exercise for at least 150 minutes each week. The type of exercise that you do should increase your heart rate and make you sweat. This is known as moderate-intensity exercise. ? Try to do strengthening exercises at least twice each week. Do these in addition to the moderate-intensity exercise.  Know your numbers.Ask your health care provider to check your cholesterol and your blood glucose. Continue to have your blood tested as directed by your health care provider.  What should I know about cancer screening? There are several types of cancer. Take the following steps to reduce your risk and to catch any cancer development as early as possible. Breast Cancer  Practice breast self-awareness. ? This means understanding how your breasts normally appear and feel. ? It also means  doing regular breast self-exams. Let your health care provider know about any changes, no matter how small.  If you are 41 or older, have a clinician do a breast exam (clinical breast exam or CBE) every year. Depending  on your age, family history, and medical history, it may be recommended that you also have a yearly breast X-ray (mammogram).  If you have a family history of breast cancer, talk with your health care provider about genetic screening.  If you are at high risk for breast cancer, talk with your health care provider about having an MRI and a mammogram every year.  Breast cancer (BRCA) gene test is recommended for women who have family members with BRCA-related cancers. Results of the assessment will determine the need for genetic counseling and BRCA1 and for BRCA2 testing. BRCA-related cancers include these types: ? Breast. This occurs in males or females. ? Ovarian. ? Tubal. This may also be called fallopian tube cancer. ? Cancer of the abdominal or pelvic lining (peritoneal cancer). ? Prostate. ? Pancreatic.  Cervical, Uterine, and Ovarian Cancer Your health care provider may recommend that you be screened regularly for cancer of the pelvic organs. These include your ovaries, uterus, and vagina. This screening involves a pelvic exam, which includes checking for microscopic changes to the surface of your cervix (Pap test).  For women ages 21-65, health care providers may recommend a pelvic exam and a Pap test every three years. For women ages 81-65, they may recommend the Pap test and pelvic exam, combined with testing for human papilloma virus (HPV), every five years. Some types of HPV increase your risk of cervical cancer. Testing for HPV may also be done on women of any age who have unclear Pap test results.  Other health care providers may not recommend any screening for nonpregnant women who are considered low risk for pelvic cancer and have no symptoms. Ask your health care provider if a screening pelvic exam is right for you.  If you have had past treatment for cervical cancer or a condition that could lead to cancer, you need Pap tests and screening for cancer for at least 20 years after  your treatment. If Pap tests have been discontinued for you, your risk factors (such as having a new sexual partner) need to be reassessed to determine if you should start having screenings again. Some women have medical problems that increase the chance of getting cervical cancer. In these cases, your health care provider may recommend that you have screening and Pap tests more often.  If you have a family history of uterine cancer or ovarian cancer, talk with your health care provider about genetic screening.  If you have vaginal bleeding after reaching menopause, tell your health care provider.  There are currently no reliable tests available to screen for ovarian cancer.  Lung Cancer Lung cancer screening is recommended for adults 82-21 years old who are at high risk for lung cancer because of a history of smoking. A yearly low-dose CT scan of the lungs is recommended if you:  Currently smoke.  Have a history of at least 30 pack-years of smoking and you currently smoke or have quit within the past 15 years. A pack-year is smoking an average of one pack of cigarettes per day for one year.  Yearly screening should:  Continue until it has been 15 years since you quit.  Stop if you develop a health problem that would prevent you from having lung cancer treatment.  Colorectal  Cancer  This type of cancer can be detected and can often be prevented.  Routine colorectal cancer screening usually begins at age 61 and continues through age 22.  If you have risk factors for colon cancer, your health care provider may recommend that you be screened at an earlier age.  If you have a family history of colorectal cancer, talk with your health care provider about genetic screening.  Your health care provider may also recommend using home test kits to check for hidden blood in your stool.  A small camera at the end of a tube can be used to examine your colon directly (sigmoidoscopy or colonoscopy).  This is done to check for the earliest forms of colorectal cancer.  Direct examination of the colon should be repeated every 5-10 years until age 50. However, if early forms of precancerous polyps or small growths are found or if you have a family history or genetic risk for colorectal cancer, you may need to be screened more often.  Skin Cancer  Check your skin from head to toe regularly.  Monitor any moles. Be sure to tell your health care provider: ? About any new moles or changes in moles, especially if there is a change in a mole's shape or color. ? If you have a mole that is larger than the size of a pencil eraser.  If any of your family members has a history of skin cancer, especially at a young age, talk with your health care provider about genetic screening.  Always use sunscreen. Apply sunscreen liberally and repeatedly throughout the day.  Whenever you are outside, protect yourself by wearing long sleeves, pants, a wide-brimmed hat, and sunglasses.  What should I know about osteoporosis? Osteoporosis is a condition in which bone destruction happens more quickly than new bone creation. After menopause, you may be at an increased risk for osteoporosis. To help prevent osteoporosis or the bone fractures that can happen because of osteoporosis, the following is recommended:  If you are 58-61 years old, get at least 1,000 mg of calcium and at least 600 mg of vitamin D per day.  If you are older than age 65 but younger than age 38, get at least 1,200 mg of calcium and at least 600 mg of vitamin D per day.  If you are older than age 78, get at least 1,200 mg of calcium and at least 800 mg of vitamin D per day.  Smoking and excessive alcohol intake increase the risk of osteoporosis. Eat foods that are rich in calcium and vitamin D, and do weight-bearing exercises several times each week as directed by your health care provider. What should I know about how menopause affects my mental  health? Depression may occur at any age, but it is more common as you become older. Common symptoms of depression include:  Low or sad mood.  Changes in sleep patterns.  Changes in appetite or eating patterns.  Feeling an overall lack of motivation or enjoyment of activities that you previously enjoyed.  Frequent crying spells.  Talk with your health care provider if you think that you are experiencing depression. What should I know about immunizations? It is important that you get and maintain your immunizations. These include:  Tetanus, diphtheria, and pertussis (Tdap) booster vaccine.  Influenza every year before the flu season begins.  Pneumonia vaccine.  Shingles vaccine.  Your health care provider may also recommend other immunizations. This information is not intended to replace advice given to  you by your health care provider. Make sure you discuss any questions you have with your health care provider. Document Released: 09/04/2005 Document Revised: 01/31/2016 Document Reviewed: 04/16/2015 Elsevier Interactive Patient Education  2018 [employer].

## 2018-03-25 NOTE — Assessment & Plan Note (Signed)
Rechecking levels. Await results. Call with any concerns.

## 2018-03-25 NOTE — Assessment & Plan Note (Signed)
Congratulated patient on weight loss of 10lbs. Continue to work on diet and exercise. Call with any concerns.

## 2018-03-25 NOTE — Progress Notes (Signed)
BP 125/74 (BP Location: Left Arm, Patient Position: Sitting, Cuff Size: Large)   Pulse 63   Temp 98.3 F (36.8 C)   Ht 5' 4.7" (1.643 m)   Wt 236 lb 8 oz (107.3 kg)   SpO2 97%   BMI 39.72 kg/m    Subjective:    Patient ID: Sandra Mendez, female    DOB: [birth year], 62 y.o.   MRN: [id number]  HPI: Sandra Mendez is a 62 y.o. female presenting on 03/25/2018 for comprehensive medical examination. Current medical complaints include:  Has been under a lot of stress. Her husband just diagnosed with dementia- now in [place]. Her sister and nephew died.   HYPERLIPIDEMIA Hyperlipidemia status: Stable Satisfied with current treatment?  yes Past cholesterol meds: none Supplements: none Aspirin:  no The 10-year ASCVD risk score Mikey Bussing DC Jr., et al., 2013) is: 7.4%   Values used to calculate the score:     Age: 75 years     Sex: Female     Is Non-Hispanic African American: Yes     Diabetic: No     Tobacco smoker: No     Systolic Blood Pressure: 638 mmHg     Is BP treated: No     HDL Cholesterol: 69 mg/dL     Total Cholesterol: 232 mg/dL Chest pain:  no Coronary artery disease:  no Family history CAD:  yes  Impaired Fasting Glucose HbA1C:  Lab Results  Component Value Date   HGBA1C 5.8 (H) 01/15/2017   Duration of elevated blood sugar: chronic Polydipsia: no Polyuria: no Weight change: yes Visual disturbance: no Glucose Monitoring: no Diabetic Education: Not Completed Family history of diabetes: yes   She currently lives with: husband Menopausal Symptoms: no  Depression Screen done today and results listed below:  Depression screen [facility] 2/9 03/25/2018 03/19/2016  Decreased Interest 0 0  Down, Depressed, Hopeless 0 0  PHQ - 2 Score 0 0  Altered sleeping 0 -  Tired, decreased energy 0 -  Change in appetite 0 -  Feeling bad or failure about yourself  0 -  Trouble concentrating 0 -  Moving slowly or fidgety/restless 0 -  Suicidal thoughts 0 -  PHQ-9 Score 0 -    Difficult doing work/chores Not difficult at all -    Past Medical History:  Past Medical History:  Diagnosis Date  . History of colon polyps   . Obesity   . Vitamin D deficiency     Surgical History:  Past Surgical History:  Procedure Laterality Date  . [place] SURGERY  2013  . CHOLECYSTECTOMY    . COLONOSCOPY WITH PROPOFOL N/A 04/21/2017   Procedure: COLONOSCOPY WITH PROPOFOL;  Surgeon: Toledo, Benay Pike, MD;  Location: [facility] ENDOSCOPY;  Service: Endoscopy;  Laterality: N/A;  . WISDOM TOOTH EXTRACTION      Medications:  No current outpatient medications on file prior to visit.   No current facility-administered medications on file prior to visit.     Allergies:  Allergies  Allergen Reactions  . Erythromycin Rash  . Mushroom Extract Complex   . Penicillins Rash    Social History:  Social History   Socioeconomic History  . Marital status: Married    Spouse name: Not on file  . Number of children: Not on file  . Years of education: Not on file  . Highest education level: Not on file  Occupational History  . Not on file  Social Needs  . Financial resource strain: Not on file  .  Food insecurity:    Worry: Not on file    Inability: Not on file  . Transportation needs:    Medical: Not on file    Non-medical: Not on file  Tobacco Use  . Smoking status: Never Smoker  . Smokeless tobacco: Never Used  Substance and Sexual Activity  . Alcohol use: Yes    Alcohol/week: 0.0 standard drinks    Comment: no alcohol in 24hrs, drinks beer occasional  . Drug use: No  . Sexual activity: Not Currently  Lifestyle  . Physical activity:    Days per week: Not on file    Minutes per session: Not on file  . Stress: Not on file  Relationships  . Social connections:    Talks on phone: Not on file    Gets together: Not on file    Attends religious service: Not on file    Active member of club or organization: Not on file    Attends meetings of clubs or organizations:  Not on file    Relationship status: Not on file  . Intimate partner violence:    Fear of current or ex partner: Not on file    Emotionally abused: Not on file    Physically abused: Not on file    Forced sexual activity: Not on file  Other Topics Concern  . Not on file  Social History Narrative  . Not on file   Social History   Tobacco Use  Smoking Status Never Smoker  Smokeless Tobacco Never Used   Social History   Substance and Sexual Activity  Alcohol Use Yes  . Alcohol/week: 0.0 standard drinks   Comment: no alcohol in 24hrs, drinks beer occasional    Family History:  Family History  Problem Relation Age of Onset  . Diabetes Mother   . Stroke Father   . Colon cancer Sister   . Diabetes Sister   . Diabetes Sister   . Breast cancer Other 42    Past medical history, surgical history, medications, allergies, family history and social history reviewed with patient today and changes made to appropriate areas of the chart.   Review of Systems  Constitutional: Negative.   HENT: Negative.   Eyes: Negative.   Respiratory: Negative.   Cardiovascular: Negative.   Gastrointestinal: Negative.   Genitourinary: Negative.   Musculoskeletal: Negative.   Skin: Negative.   Neurological: Negative.   Endo/Heme/Allergies: Negative.   Psychiatric/Behavioral: Negative for depression, hallucinations, memory loss, substance abuse and suicidal ideas. The patient is nervous/anxious. The patient does not have insomnia.     All other ROS negative except what is listed above and in the HPI.      Objective:    BP 125/74 (BP Location: Left Arm, Patient Position: Sitting, Cuff Size: Large)   Pulse 63   Temp 98.3 F (36.8 C)   Ht 5' 4.7" (1.643 m)   Wt 236 lb 8 oz (107.3 kg)   SpO2 97%   BMI 39.72 kg/m   Wt Readings from Last 3 Encounters:  03/25/18 236 lb 8 oz (107.3 kg)  11/23/17 246 lb (111.6 kg)  04/21/17 242 lb (109.8 kg)    Physical Exam  Constitutional: She is oriented  to person, place, and time. She appears well-developed and well-nourished. No distress.  HENT:  Head: Normocephalic and atraumatic.  Right Ear: Hearing, tympanic membrane, external ear and ear canal normal.  Left Ear: Hearing, tympanic membrane, external ear and ear canal normal.  Nose: Nose normal.  Mouth/Throat: Uvula  is midline, oropharynx is clear and moist and mucous membranes are normal. No oropharyngeal exudate.  Eyes: Pupils are equal, round, and reactive to light. Conjunctivae, EOM and lids are normal. Right eye exhibits no discharge. Left eye exhibits no discharge. No scleral icterus.  Neck: Normal range of motion. Neck supple. No JVD present. No tracheal deviation present. No thyromegaly present.  Cardiovascular: Normal rate, regular rhythm, normal heart sounds and intact distal pulses. Exam reveals no gallop and no friction rub.  No murmur heard. Pulmonary/Chest: Effort normal and breath sounds normal. No stridor. No respiratory distress. She has no wheezes. She has no rales. She exhibits no tenderness. Right breast exhibits no inverted nipple, no mass, no nipple discharge, no skin change and no tenderness. Left breast exhibits no inverted nipple, no mass, no nipple discharge, no skin change and no tenderness. No breast swelling, tenderness, discharge or bleeding. Breasts are symmetrical.  Abdominal: Soft. Bowel sounds are normal. She exhibits no distension and no mass. There is no tenderness. There is no rebound and no guarding. No hernia.  Genitourinary:  Genitourinary Comments: Pelvic exam deferred with shared decision making  Musculoskeletal: Normal range of motion. She exhibits no edema, tenderness or deformity.  Lymphadenopathy:    She has no cervical adenopathy.  Neurological: She is alert and oriented to person, place, and time. She displays normal reflexes. No cranial nerve deficit or sensory deficit. She exhibits normal muscle tone. Coordination normal.  Skin: Skin is warm,  dry and intact. No rash noted. She is not diaphoretic. No erythema. No pallor.  Psychiatric: She has a normal mood and affect. Her speech is normal and behavior is normal. Judgment and thought content normal. Cognition and memory are normal.  Nursing note and vitals reviewed.   Results for orders placed or performed in visit on 11/23/17  VITAMIN D 25 Hydroxy (Vit-D Deficiency, Fractures)  Result Value Ref Range   Vit D, 25-Hydroxy 17.2 (L) 30.0 - 100.0 ng/mL  POCT glucose (manual entry)  Result Value Ref Range   POC Glucose 104 (A) 70 - 99 mg/dl      Assessment & Plan:   Problem List Items Addressed This Visit      Endocrine   IFG (impaired fasting glucose)    Rechecking levels. Await results. Call with any concerns.       Relevant Orders   Microalbumin, Urine Waived   TSH   UA/M w/rflx Culture, Routine   Bayer DCA Hb A1c Waived   CBC with Differential/Platelet   Comprehensive metabolic panel     Other   Hyperlipidemia    Rechecking levels. Await results. Call with any concerns.       Relevant Orders   Lipid Panel w/o Chol/HDL Ratio   TSH   UA/M w/rflx Culture, Routine   CBC with Differential/Platelet   Comprehensive metabolic panel   Vitamin D deficiency    Rechecking levels. Await results. Call with any concerns.       Relevant Orders   TSH   UA/M w/rflx Culture, Routine   VITAMIN D 25 Hydroxy (Vit-D Deficiency, Fractures)   CBC with Differential/Platelet   Comprehensive metabolic panel   Obesity    Congratulated patient on weight loss of 10lbs. Continue to work on diet and exercise. Call with any concerns.       Relevant Orders   TSH   UA/M w/rflx Culture, Routine   CBC with Differential/Platelet   Comprehensive metabolic panel    Other Visit Diagnoses    Routine  general medical examination at a health care facility    -  Primary   Vaccines up to date. Screening labs checked at health department. Ordered today. Pap and colon up to date. Mammogram  ordered. Continue diet and exercise.   Relevant Orders   Lipid Panel w/o Chol/HDL Ratio   Microalbumin, Urine Waived   TSH   UA/M w/rflx Culture, Routine   VITAMIN D 25 Hydroxy (Vit-D Deficiency, Fractures)   Bayer DCA Hb A1c Waived   CBC with Differential/Platelet   Comprehensive metabolic panel   Screening for breast cancer       Mammogram ordered today.   Relevant Orders   MM DIGITAL SCREENING BILATERAL       Follow up plan: Return in about 6 months (around 09/24/2018) for 6 month follow up.   LABORATORY TESTING:  - Pap smear: up to date  IMMUNIZATIONS:   - Tdap: Tetanus vaccination status reviewed: last tetanus booster within 10 years. - Influenza: Given elsewhere (at work) - Pneumovax: Not applicable  SCREENING: -Mammogram: Ordered today  - Colonoscopy: Up to date   PATIENT COUNSELING:   Advised to take 1 mg of folate supplement per day if capable of pregnancy.   Sexuality: Discussed sexually transmitted diseases, partner selection, use of condoms, avoidance of unintended pregnancy  and contraceptive alternatives.   Advised to avoid cigarette smoking.  I discussed with the patient that most people either abstain from alcohol or drink within safe limits (<=14/week and <=4 drinks/occasion for males, <=7/weeks and <= 3 drinks/occasion for females) and that the risk for alcohol disorders and other health effects rises proportionally with the number of drinks per week and how often a drinker exceeds daily limits.  Discussed cessation/primary prevention of drug use and availability of treatment for abuse.   Diet: Encouraged to adjust caloric intake to maintain  or achieve ideal body weight, to reduce intake of dietary saturated fat and total fat, to limit sodium intake by avoiding high sodium foods and not adding table salt, and to maintain adequate dietary potassium and calcium preferably from fresh fruits, vegetables, and low-fat dairy products.    stressed the  importance of regular exercise  Injury prevention: Discussed safety belts, safety helmets, smoke detector, smoking near bedding or upholstery.   Dental health: Discussed importance of regular tooth brushing, flossing, and dental visits.    NEXT PREVENTATIVE PHYSICAL DUE IN 1 YEAR. Return in about 6 months (around 09/24/2018) for 6 month follow up.

## 2018-03-30 ENCOUNTER — Other Ambulatory Visit: Payer: Self-pay

## 2018-03-30 DIAGNOSIS — Z0000 Encounter for general adult medical examination without abnormal findings: Secondary | ICD-10-CM

## 2018-03-30 DIAGNOSIS — E559 Vitamin D deficiency, unspecified: Secondary | ICD-10-CM

## 2018-03-30 DIAGNOSIS — R7301 Impaired fasting glucose: Secondary | ICD-10-CM

## 2018-03-30 DIAGNOSIS — E782 Mixed hyperlipidemia: Secondary | ICD-10-CM

## 2018-03-30 DIAGNOSIS — Z6839 Body mass index (BMI) 39.0-39.9, adult: Secondary | ICD-10-CM

## 2018-03-31 LAB — LIPID PANEL WITH LDL/HDL RATIO
Cholesterol, Total: 273 mg/dL — ABNORMAL HIGH (ref 100–199)
HDL: 74 mg/dL (ref >39)
LDL Calculated: 180 mg/dL — ABNORMAL HIGH (ref 0–99)
LDl/HDL Ratio: 2.4 ratio (ref 0.0–3.2)
TRIGLYCERIDES: 97 mg/dL (ref 0–149)
VLDL Cholesterol Cal: 19 mg/dL (ref 5–40)

## 2018-03-31 LAB — CBC WITH DIFFERENTIAL/PLATELET
BASOS ABS: 0 10*3/uL (ref 0.0–0.2)
Basos: 1 %
EOS (ABSOLUTE): 0.1 10*3/uL (ref 0.0–0.4)
Eos: 3 %
HEMOGLOBIN: 12.5 g/dL (ref 11.1–15.9)
Hematocrit: 38.4 % (ref 34.0–46.6)
Immature Grans (Abs): 0 10*3/uL (ref 0.0–0.1)
Immature Granulocytes: 0 %
LYMPHS ABS: 1.5 10*3/uL (ref 0.7–3.1)
Lymphs: 30 %
MCH: 30.3 pg (ref 26.6–33.0)
MCHC: 32.6 g/dL (ref 31.5–35.7)
MCV: 93 fL (ref 79–97)
MONOCYTES: 9 %
MONOS ABS: 0.5 10*3/uL (ref 0.1–0.9)
Neutrophils Absolute: 2.9 10*3/uL (ref 1.4–7.0)
Neutrophils: 57 %
Platelets: 194 10*3/uL (ref 150–450)
RBC: 4.12 x10E6/uL (ref 3.77–5.28)
RDW: 13.2 % (ref 12.3–15.4)
WBC: 5 10*3/uL (ref 3.4–10.8)

## 2018-03-31 LAB — COMPREHENSIVE METABOLIC PANEL
A/G RATIO: 2 (ref 1.2–2.2)
ALBUMIN: 4.3 g/dL (ref 3.6–4.8)
ALT: 24 IU/L (ref 0–32)
AST: 35 IU/L (ref 0–40)
Alkaline Phosphatase: 92 IU/L (ref 39–117)
BILIRUBIN TOTAL: 0.2 mg/dL (ref 0.0–1.2)
BUN / CREAT RATIO: 15 (ref 12–28)
BUN: 12 mg/dL (ref 8–27)
CHLORIDE: 104 mmol/L (ref 96–106)
CO2: 24 mmol/L (ref 20–29)
Calcium: 8.4 mg/dL — ABNORMAL LOW (ref 8.7–10.3)
Creatinine, Ser: 0.79 mg/dL (ref 0.57–1.00)
GFR calc non Af Amer: 79 mL/min/{1.73_m2} (ref >59)
GFR, EST AFRICAN AMERICAN: 91 mL/min/{1.73_m2} (ref >59)
GLOBULIN, TOTAL: 2.2 g/dL (ref 1.5–4.5)
GLUCOSE: 91 mg/dL (ref 65–99)
Potassium: 4.6 mmol/L (ref 3.5–5.2)
SODIUM: 144 mmol/L (ref 134–144)
TOTAL PROTEIN: 6.5 g/dL (ref 6.0–8.5)

## 2018-03-31 LAB — HGB A1C W/O EAG: HEMOGLOBIN A1C: 5.8 % — AB (ref 4.8–5.6)

## 2018-03-31 LAB — TSH: TSH: 4.41 u[IU]/mL (ref 0.450–4.500)

## 2018-03-31 LAB — VITAMIN D 25 HYDROXY (VIT D DEFICIENCY, FRACTURES): VIT D 25 HYDROXY: 18.2 ng/mL — AB (ref 30.0–100.0)

## 2018-04-06 LAB — MICROALBUMIN / CREATININE URINE RATIO
Creatinine, Urine: 206.2 mg/dL
MICROALB/CREAT RATIO: 4.8 mg/g{creat} (ref 0.0–30.0)
MICROALBUM., U, RANDOM: 10 ug/mL

## 2018-04-06 LAB — MICROSCOPIC EXAMINATION
Bacteria, UA: NONE SEEN
CASTS: NONE SEEN /LPF

## 2018-04-06 LAB — UA/M W/RFLX CULTURE, ROUTINE
Bilirubin, UA: NEGATIVE
Glucose, UA: NEGATIVE
KETONES UA: NEGATIVE
Leukocytes, UA: NEGATIVE
NITRITE UA: NEGATIVE
PH UA: 7.5 (ref 5.0–7.5)
Protein, UA: NEGATIVE
RBC, UA: NEGATIVE
Specific Gravity, UA: 1.027 (ref 1.005–1.030)
Urobilinogen, Ur: 1 mg/dL (ref 0.2–1.0)

## 2018-04-21 ENCOUNTER — Telehealth: Payer: Self-pay | Admitting: Family Medicine

## 2018-04-21 MED ORDER — CYCLOBENZAPRINE HCL 10 MG PO TABS: 10.0000 mg | ORAL_TABLET | Freq: Three times a day (TID) | ORAL | 0 refills | Status: DC | PRN

## 2018-04-21 NOTE — Telephone Encounter (Signed)
Message relayed to patient. Verbalized understanding and denied questions.   

## 2018-04-21 NOTE — Telephone Encounter (Signed)
Copied from [place] [id number]. Topic: Quick Communication - Rx Refill/Question >> Apr 21, 2018  9:23 AM Scherrie Gerlach wrote: Medication: spirolactone  cyclobenzaprine (FLEXERIL) tablet 5 mg  Pt states her husband was prescribed she spirolactone and has dropped 30 lbs.  Pt states Dr Wynetta Emery aware she has a weight problem and wants to know if she will prescribe for her. The flexeril she wants for tight muscles after she works out. Does not use regularly.  [place] (N), [place]

## 2018-04-21 NOTE — Telephone Encounter (Signed)
I will not call in spironalactone for her. It is not a weight loss drug. She would need an appointment to discuss it.   Rx for flexeril sent to her pharmacy

## 2018-04-24 NOTE — Progress Notes (Signed)
BP 122/77 (BP Location: Left Arm, Patient Position: Sitting, Cuff Size: Large)   Pulse 68   Ht 5' 4.7" (1.643 m)   Wt 243 lb 6 oz (110.4 kg)   SpO2 100%   BMI 40.88 kg/m    Subjective:    Patient ID: Sandra Mendez, female    DOB: 12-02-1955, 62 y.o.   MRN: [id number]  HPI: Sandra Mendez is a 62 y.o. female  Chief Complaint  Patient presents with  . Obesity   WEIGHT GAIN- discussed weight loss medicine in June. Offered to start patient on contrave, she thought it was too expensive, so wellbutrin was called in instead. She did not continue it. She doesn't think that she picked it up. She states that her husband was just started on spironalactone and would like to take it as a weight loss medicine. Duration: chronic Previous attempts at weight loss: yes- diet, exercise, bariatric surgery, Wellbutrin Complications of obesity: IFG, HLD Peak weight: 290 Weight loss goal: 145-150 Weight loss to date: none Requesting obesity pharmacotherapy: yes Current weight loss supplements/medications: no Previous weight loss supplements/meds: hx of bariatric surgery, wellbutrin  Has been having a lot of pain in her R heel. She notes that she has a sharp pain in her R heel- worse in the morning when she first gets up and with walking. No other concerns or complaints at this time.   Relevant past medical, surgical, family and social history reviewed and updated as indicated. Interim medical history since our last visit reviewed. Allergies and medications reviewed and updated.  Review of Systems  Constitutional: Negative.   Respiratory: Negative.   Cardiovascular: Negative.   Musculoskeletal: Positive for arthralgias. Negative for back pain, gait problem, joint swelling, myalgias, neck pain and neck stiffness.  Skin: Negative.   Psychiatric/Behavioral: Negative.     Per HPI unless specifically indicated above     Objective:    BP 122/77 (BP Location: Left Arm, Patient Position:  Sitting, Cuff Size: Large)   Pulse 68   Ht 5' 4.7" (1.643 m)   Wt 243 lb 6 oz (110.4 kg)   SpO2 100%   BMI 40.88 kg/m   Wt Readings from Last 3 Encounters:  04/25/18 243 lb 6 oz (110.4 kg)  03/25/18 236 lb 8 oz (107.3 kg)  11/23/17 246 lb (111.6 kg)    Physical Exam  Constitutional: She is oriented to person, place, and time. She appears well-developed and well-nourished. No distress.  HENT:  Head: Normocephalic and atraumatic.  Right Ear: Hearing normal.  Left Ear: Hearing normal.  Nose: Nose normal.  Eyes: Conjunctivae and lids are normal. Right eye exhibits no discharge. Left eye exhibits no discharge. No scleral icterus.  Cardiovascular: Normal rate, regular rhythm, normal heart sounds and intact distal pulses. Exam reveals no gallop and no friction rub.  No murmur heard. Pulmonary/Chest: Effort normal and breath sounds normal. No stridor. No respiratory distress. She has no wheezes. She has no rales. She exhibits no tenderness.  Musculoskeletal: Normal range of motion.  Tenderness on R heel   Neurological: She is alert and oriented to person, place, and time.  Skin: Skin is warm, dry and intact. Capillary refill takes less than 2 seconds. No rash noted. She is not diaphoretic. No erythema. No pallor.  Psychiatric: She has a normal mood and affect. Her speech is normal and behavior is normal. Judgment and thought content normal. Cognition and memory are normal.  Nursing note and vitals reviewed.   Results for  orders placed or performed in visit on 03/30/18  Microscopic Examination  Result Value Ref Range   WBC, UA 0-5 0 - 5 /hpf   RBC, UA 0-2 0 - 2 /hpf   Epithelial Cells (non renal) 0-10 0 - 10 /hpf   Casts None seen None seen /lpf   Mucus, UA Present Not Estab.   Bacteria, UA None seen None seen/Few  Comprehensive metabolic panel  Result Value Ref Range   Glucose 91 65 - 99 mg/dL   BUN 12 8 - 27 mg/dL   Creatinine, Ser 0.79 0.57 - 1.00 mg/dL   GFR calc non Af Amer  79 >59 mL/min/1.73   GFR calc Af Amer 91 >59 mL/min/1.73   BUN/Creatinine Ratio 15 12 - 28   Sodium 144 134 - 144 mmol/L   Potassium 4.6 3.5 - 5.2 mmol/L   Chloride 104 96 - 106 mmol/L   CO2 24 20 - 29 mmol/L   Calcium 8.4 (L) 8.7 - 10.3 mg/dL   Total Protein 6.5 6.0 - 8.5 g/dL   Albumin 4.3 3.6 - 4.8 g/dL   Globulin, Total 2.2 1.5 - 4.5 g/dL   Albumin/Globulin Ratio 2.0 1.2 - 2.2   Bilirubin Total 0.2 0.0 - 1.2 mg/dL   Alkaline Phosphatase 92 39 - 117 IU/L   AST 35 0 - 40 IU/L   ALT 24 0 - 32 IU/L  CBC with Differential/Platelet  Result Value Ref Range   WBC 5.0 3.4 - 10.8 x10E3/uL   RBC 4.12 3.77 - 5.28 x10E6/uL   Hemoglobin 12.5 11.1 - 15.9 g/dL   Hematocrit 38.4 34.0 - 46.6 %   MCV 93 79 - 97 fL   MCH 30.3 26.6 - 33.0 pg   MCHC 32.6 31.5 - 35.7 g/dL   RDW 13.2 12.3 - 15.4 %   Platelets 194 150 - 450 x10E3/uL   Neutrophils 57 Not Estab. %   Lymphs 30 Not Estab. %   Monocytes 9 Not Estab. %   Eos 3 Not Estab. %   Basos 1 Not Estab. %   Neutrophils Absolute 2.9 1.4 - 7.0 x10E3/uL   Lymphocytes Absolute 1.5 0.7 - 3.1 x10E3/uL   Monocytes Absolute 0.5 0.1 - 0.9 x10E3/uL   EOS (ABSOLUTE) 0.1 0.0 - 0.4 x10E3/uL   Basophils Absolute 0.0 0.0 - 0.2 x10E3/uL   Immature Granulocytes 0 Not Estab. %   Immature Grans (Abs) 0.0 0.0 - 0.1 x10E3/uL  Hemoglobin A1c  Result Value Ref Range   Hgb A1c MFr Bld 5.8 (H) 4.8 - 5.6 %  VITAMIN D 25 Hydroxy (Vit-D Deficiency, Fractures)  Result Value Ref Range   Vit D, 25-Hydroxy 18.2 (L) 30.0 - 100.0 ng/mL  UA/M w/rflx Culture, Routine  Result Value Ref Range   Specific Gravity, UA 1.027 1.005 - 1.030   pH, UA 7.5 5.0 - 7.5   Color, UA Yellow Yellow   Appearance Ur Clear Clear   Leukocytes, UA Negative Negative   Protein, UA Negative Negative/Trace   Glucose, UA Negative Negative   Ketones, UA Negative Negative   RBC, UA Negative Negative   Bilirubin, UA Negative Negative   Urobilinogen, Ur 1.0 0.2 - 1.0 mg/dL   Nitrite, UA Negative  Negative   Microscopic Examination Comment    Microscopic Examination See below:    Urinalysis Reflex Comment   TSH  Result Value Ref Range   TSH 4.410 0.450 - 4.500 uIU/mL  Lipid Panel With LDL/HDL Ratio  Result Value Ref Range  Cholesterol, Total 273 (H) 100 - 199 mg/dL   Triglycerides 97 0 - 149 mg/dL   HDL 74 >39 mg/dL   VLDL Cholesterol Cal 19 5 - 40 mg/dL   LDL Calculated 180 (H) 0 - 99 mg/dL   LDl/HDL Ratio 2.4 0.0 - 3.2 ratio  Microalbumin / creatinine urine ratio  Result Value Ref Range   Creatinine, Urine 206.2 Not Estab. mg/dL   Microalbumin, Urine 10.0 Not Estab. ug/mL   Microalb/Creat Ratio 4.8 0.0 - 30.0 mg/g creat      Assessment & Plan:   Problem List Items Addressed This Visit      Other   Morbid obesity ([place]) - Primary    Will check with insurance to see what is covered. List given. Call with any concerns.        Other Visit Diagnoses    Plantar fasciitis       Will start exercises. If not better, will send to podiatry.       Follow up plan: Return 1 month after starting medicine.

## 2018-04-25 ENCOUNTER — Encounter: Payer: Self-pay | Admitting: Family Medicine

## 2018-04-25 ENCOUNTER — Ambulatory Visit (INDEPENDENT_AMBULATORY_CARE_PROVIDER_SITE_OTHER): Payer: Managed Care, Other (non HMO) | Admitting: Family Medicine

## 2018-04-25 DIAGNOSIS — M722 Plantar fascial fibromatosis: Secondary | ICD-10-CM | POA: Diagnosis not present

## 2018-04-25 NOTE — Assessment & Plan Note (Signed)
Will check with insurance to see what is covered. List given. Call with any concerns.

## 2018-04-25 NOTE — Patient Instructions (Addendum)
Call your insurance to see if they cover: Belviq, Contrave or Saxenda- call me and I'll send it through  Bupropion; Naltrexone extended-release tablets What is this medicine? BUPROPION; NALTREXONE (byoo PROE pee on; nal TREX one) is a combination product used to promote and maintain weight loss in obese adults or overweight adults who also have weight related medical problems. This medicine should be used with a reduced calorie diet and increased physical activity. This medicine may be used for other purposes; ask your health care provider or pharmacist if you have questions. COMMON BRAND NAME(S): CONTRAVE What should I tell my health care provider before I take this medicine? They need to know if you have any of these conditions: -an eating disorder, such as anorexia or bulimia -bipolar disorder -diabetes -depression -drug abuse or addiction -glaucoma -head injury -heart disease -high blood pressure -history of a tumor or infection of your brain or spine -history of stroke -history of irregular heartbeat -if you often drink alcohol -kidney disease -liver disease -schizophrenia -seizures -suicidal thoughts, plans, or attempt; a previous suicide attempt by you or a family member -an unusual or allergic reaction to bupropion, naltrexone, other medicines, foods, dyes, or preservatives -breast-feeding -pregnant or trying to become pregnant How should I use this medicine? Take this medicine by mouth with a glass of water. Follow the directions on the prescription label. Take this medicine in the morning and in the evenings as directed by your healthcare professional. Dennis Bast can take it with or without food. Do not take with high-fat meals as this may increase your risk of seizures. Do not crush, chew, or cut these tablets. Do not take your medicine more often than directed. Do not stop taking this medicine suddenly except upon the advice of your doctor. A special MedGuide will be given to you  by the pharmacist with each prescription and refill. Be sure to read this information carefully each time. Talk to your pediatrician regarding the use of this medicine in children. Special care may be needed. Overdosage: If you think you have taken too much of this medicine contact a poison control center or emergency room at once. NOTE: This medicine is only for you. Do not share this medicine with others. What if I miss a dose? If you miss a dose, skip the missed dose and take your next tablet at the regular time. Do not take double or extra doses. What may interact with this medicine? Do not take this medicine with any of the following medications: -any prescription or street opioid drug like codeine, heroin, methadone -linezolid -MAOIs like Carbex, Eldepryl, Marplan, Nardil, and Parnate -methylene blue (injected into a vein) -other medicines that contain bupropion like Zyban or Wellbutrin This medicine may also interact with the following medications: -alcohol -certain medicines for anxiety or sleep -certain medicines for blood pressure like metoprolol, propranolol -certain medicines for depression or psychotic disturbances -certain medicines for HIV or AIDS like efavirenz, lopinavir, nelfinavir, ritonavir -certain medicines for irregular heart beat like propafenone, flecainide -certain medicines for Parkinson's disease like amantadine, levodopa -certain medicines for seizures like carbamazepine, phenytoin, phenobarbital -cimetidine -clopidogrel -cyclophosphamide -digoxin -disulfiram -furazolidone -isoniazid -nicotine -orphenadrine -procarbazine -steroid medicines like prednisone or cortisone -stimulant medicines for attention disorders, weight loss, or to stay awake -tamoxifen -theophylline -thioridazine -thiotepa -ticlopidine -tramadol -warfarin This list may not describe all possible interactions. Give your health care provider a list of all the medicines, herbs,  non-prescription drugs, or dietary supplements you use. Also tell them if you smoke, drink  alcohol, or use illegal drugs. Some items may interact with your medicine. What should I watch for while using this medicine? This medicine is intended to be used in addition to a healthy diet and appropriate exercise. The best results are achieved this way. Do not increase or in any way change your dose without consulting your doctor or health care professional. Do not take this medicine with other prescription or over-the-counter weight loss products without consulting your doctor or health care professional. Your doctor should tell you to stop taking this medicine if you do not lose a certain amount of weight within the first 12 weeks of treatment. Visit your doctor or health care professional for regular checkups. Your doctor may order blood tests or other tests to see how you are doing. This medicine may affect blood sugar levels. If you have diabetes, check with your doctor or health care professional before you change your diet or the dose of your diabetic medicine. Patients and their families should watch out for new or worsening depression or thoughts of suicide. Also watch out for sudden changes in feelings such as feeling anxious, agitated, panicky, irritable, hostile, aggressive, impulsive, severely restless, overly excited and hyperactive, or not being able to sleep. If this happens, especially at the beginning of treatment or after a change in dose, call your health care professional. Avoid alcoholic drinks while taking this medicine. Drinking large amounts of alcoholic beverages, using sleeping or anxiety medicines, or quickly stopping the use of these agents while taking this medicine may increase your risk for a seizure. What side effects may I notice from receiving this medicine? Side effects that you should report to your doctor or health care professional as soon as possible: -allergic reactions  like skin rash, itching or hives, swelling of the face, lips, or tongue -breathing problems -changes in vision -confusion -elevated mood, decreased need for sleep, racing thoughts, impulsive behavior -fast or irregular heartbeat -hallucinations, loss of contact with reality -increased blood pressure -redness, blistering, peeling or loosening of the skin, including inside the mouth -seizures -signs and symptoms of liver injury like dark yellow or brown urine; general ill feeling or flu-like symptoms; light-colored stools; loss of appetite; nausea; right upper belly pain; unusually weak or tired; yellowing of the eyes or skin -suicidal thoughts or other mood changes -vomiting Side effects that usually do not require medical attention (report to your doctor or health care professional if they continue or are bothersome): -constipation -headache -loss of appetite -indigestion, stomach upset -tremors This list may not describe all possible side effects. Call your doctor for medical advice about side effects. You may report side effects to FDA at 1-800-FDA-1088. Where should I keep my medicine? Keep out of the reach of children. Store at room temperature between 15 and 30 degrees C (59 and 86 degrees F). Throw away any unused medicine after the expiration date. NOTE: This sheet is a summary. It may not cover all possible information. If you have questions about this medicine, talk to your doctor, pharmacist, or health care provider.  2018 Elsevier/Gold Standard (2016-01-03 13:42:58) Lorcaserin extended-release tablets What is this medicine? LORCASERIN (lor ca SER in) is used to promote and maintain weight loss in obese patients. This medicine should be used with a reduced calorie diet and, if appropriate, an exercise program. This medicine may be used for other purposes; ask your health care provider or pharmacist if you have questions. COMMON BRAND NAME(S): Belviq XR What should I tell my  health  care provider before I take this medicine? They need to know if you have any of these conditions: -anatomical deformation of the penis, Peyronie's disease, or history of priapism (painful and prolonged erection) -diabetes -heart disease -history of blood diseases, like sickle cell anemia or leukemia -history of irregular heartbeat -kidney disease -liver disease -suicidal thoughts, plans, or attempt; a previous suicide attempt by you or a family member -an unusual or allergic reaction to lorcaserin, other medicines, foods, dyes, or preservatives -pregnant or trying to get pregnant -breast-feeding How should I use this medicine? Take this medicine by mouth with a glass of water. Follow the directions on the prescription label. Do not cut, crush or chew this medicine. You can take it with or without food. Take your medicine at regular intervals. Do not take it more often than directed. Do not stop taking except on your doctor's advice. Talk to your pediatrician regarding the use of this medicine in children. Special care may be needed. Overdosage: If you think you have taken too much of this medicine contact a poison control center or emergency room at once. NOTE: This medicine is only for you. Do not share this medicine with others. What if I miss a dose? If you miss a dose, take it as soon as you can. If it is almost time for your next dose, take only that dose. Do not take double or extra doses. What may interact with this medicine? -cabergoline -certain medicines for depression, anxiety, or psychotic disturbances -certain medicines for erectile dysfunction -certain medicines for migraine headache like almotriptan, eletriptan, frovatriptan, naratriptan, rizatriptan, sumatriptan, zolmitriptan -dextromethorphan -linezolid -lithium -medicines for diabetes -other weight loss products -tramadol -St. John's Wort -stimulant medicines for attention disorders, weight loss, or to stay  awake -tryptophan This list may not describe all possible interactions. Give your health care provider a list of all the medicines, herbs, non-prescription drugs, or dietary supplements you use. Also tell them if you smoke, drink alcohol, or use illegal drugs. Some items may interact with your medicine. What should I watch for while using this medicine? This medicine is intended to be used in addition to a healthy diet and appropriate exercise. The best results are achieved this way. Your doctor should instruct you to stop using this medicine if you do not lose a certain amount of weight within the first 12 weeks of treatment, but it is important that you do not change your dose in any way without consulting your doctor or health care professional. Visit your doctor or health care professional for regular checkups. Your doctor may order blood tests or other tests to see how you are doing. Do not drive, use machinery, or do anything that needs mental alertness until you know how this medicine affects you. This medicine may affect blood sugar levels. If you have diabetes, check with your doctor or health care professional before you change your diet or the dose of your diabetic medicine. Patients and their families should watch out for worsening depression or thoughts of suicide. Also watch out for sudden changes in feelings such as feeling anxious, agitated, panicky, irritable, hostile, aggressive, impulsive, severely restless, overly excited and hyperactive, or not being able to sleep. If this happens, especially at the beginning of treatment or after a change in dose, call your health care professional. Contact your doctor or health care professional right away if you are a man with an erection that lasts longer than 4 hours or if the erection becomes painful. This may  be a sign of serious problem and must be treated right away to prevent permanent damage. What side effects may I notice from receiving this  medicine? Side effects that you should report to your doctor or health care professional as soon as possible: -allergic reactions like skin rash, itching or hives, swelling of the face, lips, or tongue -abnormal production of milk -breast enlargement in both males and females -breathing problems -changes in emotions or moods -changes in vision -confusion -erection lasting more than 4 hours or a painful erection -fast or irregular heart beat -feeling faint or lightheaded, falls -fever or chills, sore throat -hallucination, loss of contact with reality -high or low blood pressure -menstrual changes -restlessness -signs and symptoms of low blood sugar such as feeling anxious; confusion; dizziness; increased hunger; unusually weak or tired; sweating; shakiness; cold; irritable; headache; blurred vision; fast heartbeat; loss of consciousness -slow or irregular heartbeat -stiff muscles -sweating -suicidal thoughts or actions -swelling of the ankles, feet, hands -unusually weak or tired -vomiting Side effects that usually do not require medical attention (report to your doctor or health care professional if they continue or are bothersome): -back pain -constipation -cough -dry mouth -nausea -tiredness This list may not describe all possible side effects. Call your doctor for medical advice about side effects. You may report side effects to FDA at 1-800-FDA-1088. Where should I keep my medicine? Keep out of the reach of children. This medicine can be abused. Keep your medicine in a safe place to protect it from theft. Do not share this medicine with anyone. Selling or giving away this medicine is dangerous and against the law. Store at room temperature between 15 and 30 degrees C (59 and 86 degrees F). Throw away any unused medicine after the expiration date. NOTE: This sheet is a summary. It may not cover all possible information. If you have questions about this medicine, talk to your  doctor, pharmacist, or health care provider.  2018 Elsevier/Gold Standard (2015-08-14 16:24:54) Liraglutide injection (Weight Management) What is this medicine? LIRAGLUTIDE (LIR a GLOO tide) is used with a reduced calorie diet and exercise to help you lose weight. This medicine may be used for other purposes; ask your health care provider or pharmacist if you have questions. COMMON BRAND NAME(S): Saxenda What should I tell my health care provider before I take this medicine? They need to know if you have any of these conditions: -endocrine tumors (MEN 2) or if someone in your family had these tumors -gallbladder disease -high cholesterol -history of alcohol abuse problem -history of pancreatitis -kidney disease or if you are on dialysis -liver disease -previous swelling of the tongue, face, or lips with difficulty breathing, difficulty swallowing, hoarseness, or tightening of the throat -stomach problems -suicidal thoughts, plans, or attempt; a previous suicide attempt by you or a family member -thyroid cancer or if someone in your family had thyroid cancer -an unusual or allergic reaction to liraglutide, other medicines, foods, dyes, or preservatives -pregnant or trying to get pregnant -breast-feeding How should I use this medicine? This medicine is for injection under the skin of your upper leg, stomach area, or upper arm. You will be taught how to prepare and give this medicine. Use exactly as directed. Take your medicine at regular intervals. Do not take it more often than directed. It is important that you put your used needles and syringes in a special sharps container. Do not put them in a trash can. If you do not have a sharps container,  call your pharmacist or healthcare provider to get one. A special MedGuide will be given to you by the pharmacist with each prescription and refill. Be sure to read this information carefully each time. Talk to your pediatrician regarding the use  of this medicine in children. Special care may be needed. Overdosage: If you think you have taken too much of this medicine contact a poison control center or emergency room at once. NOTE: This medicine is only for you. Do not share this medicine with others. What if I miss a dose? If you miss a dose, take it as soon as you can. If it is almost time for your next dose, take only that dose. Do not take double or extra doses. If you miss your dose for 3 days or more, call your doctor or health care professional to talk about how to restart this medicine. What may interact with this medicine? -insulin and other medicines for diabetes This list may not describe all possible interactions. Give your health care provider a list of all the medicines, herbs, non-prescription drugs, or dietary supplements you use. Also tell them if you smoke, drink alcohol, or use illegal drugs. Some items may interact with your medicine. What should I watch for while using this medicine? Visit your doctor or health care professional for regular checks on your progress. This medicine is intended to be used in addition to a healthy diet and appropriate exercise. The best results are achieved this way. Do not increase or in any way change your dose without consulting your doctor or health care professional. Drink plenty of fluids while taking this medicine. Check with your doctor or health care professional if you get an attack of severe diarrhea, nausea, and vomiting. The loss of too much body fluid can make it dangerous for you to take this medicine. This medicine may affect blood sugar levels. If you have diabetes, check with your doctor or health care professional before you change your diet or the dose of your diabetic medicine. Patients and their families should watch out for worsening depression or thoughts of suicide. Also watch out for sudden changes in feelings such as feeling anxious, agitated, panicky, irritable,  hostile, aggressive, impulsive, severely restless, overly excited and hyperactive, or not being able to sleep. If this happens, especially at the beginning of treatment or after a change in dose, call your health care professional. What side effects may I notice from receiving this medicine? Side effects that you should report to your doctor or health care professional as soon as possible: -allergic reactions like skin rash, itching or hives, swelling of the face, lips, or tongue -breathing problems -diarrhea that continues or is severe -lump or swelling on the neck -severe nausea -signs and symptoms of infection like fever or chills; cough; sore throat; pain or trouble passing urine -signs and symptoms of low blood sugar such as feeling anxious, confusion, dizziness, increased hunger, unusually weak or tired, sweating, shakiness, cold, irritable, headache, blurred vision, fast heartbeat, loss of consciousness -signs and symptoms of kidney injury like trouble passing urine or change in the amount of urine -trouble swallowing -unusual stomach upset or pain -vomiting Side effects that usually do not require medical attention (report to your doctor or health care professional if they continue or are bothersome): -constipation -decreased appetite -diarrhea -fatigue -headache -nausea -pain, redness, or irritation at site where injected -stomach upset -stuffy or runny nose This list may not describe all possible side effects. Call your doctor for  medical advice about side effects. You may report side effects to FDA at 1-800-FDA-1088. Where should I keep my medicine? Keep out of the reach of children. Store unopened pen in a refrigerator between 2 and 8 degrees C (36 and 46 degrees F). Do not freeze or use if the medicine has been frozen. Protect from light and excessive heat. After you first use the pen, it can be stored at room temperature between 15 and 30 degrees C (59 and 86 degrees F) or in  a refrigerator. Throw away your used pen after 30 days or after the expiration date, whichever comes first. Do not store your pen with the needle attached. If the needle is left on, medicine may leak from the pen. NOTE: This sheet is a summary. It may not cover all possible information. If you have questions about this medicine, talk to your doctor, pharmacist, or health care provider.  2018 Elsevier/Gold Standard (2016-07-30 14:41:37)  Plantar Fasciitis Plantar fasciitis is a painful foot condition that affects the heel. It occurs when the band of tissue that connects the toes to the heel bone (plantar fascia) becomes irritated. This can happen after exercising too much or doing other repetitive activities (overuse injury). The pain from plantar fasciitis can range from mild irritation to severe pain that makes it difficult for you to walk or move. The pain is usually worse in the morning or after you have been sitting or lying down for a while. What are the causes? This condition may be caused by:  Standing for long periods of time.  Wearing shoes that do not fit.  Doing high-impact activities, including running, aerobics, and ballet.  Being overweight.  Having an abnormal way of walking (gait).  Having tight calf muscles.  Having high arches in your feet.  Starting a new athletic activity.  What are the signs or symptoms? The main symptom of this condition is heel pain. Other symptoms include:  Pain that gets worse after activity or exercise.  Pain that is worse in the morning or after resting.  Pain that goes away after you walk for a few minutes.  How is this diagnosed? This condition may be diagnosed based on your signs and symptoms. Your health care provider will also do a physical exam to check for:  A tender area on the bottom of your foot.  A high arch in your foot.  Pain when you move your foot.  Difficulty moving your foot.  You may also need to have imaging  studies to confirm the diagnosis. These can include:  X-rays.  Ultrasound.  MRI.  How is this treated? Treatment for plantar fasciitis depends on the severity of the condition. Your treatment may include:  Rest, ice, and over-the-counter pain medicines to manage your pain.  Exercises to stretch your calves and your plantar fascia.  A splint that holds your foot in a stretched, upward position while you sleep (night splint).  Physical therapy to relieve symptoms and prevent problems in the future.  Cortisone injections to relieve severe pain.  Extracorporeal shock wave therapy (ESWT) to stimulate damaged plantar fascia with electrical impulses. It is often used as a last resort before surgery.  Surgery, if other treatments have not worked after 12 months.  Follow these instructions at home:  Take medicines only as directed by your health care provider.  Avoid activities that cause pain.  Roll the bottom of your foot over a bag of ice or a bottle of cold water. Do  this for 20 minutes, 3-4 times a day.  Perform simple stretches as directed by your health care provider.  Try wearing athletic shoes with air-sole or gel-sole cushions or soft shoe inserts.  Wear a night splint while sleeping, if directed by your health care provider.  Keep all follow-up appointments with your health care provider. How is this prevented?  Do not perform exercises or activities that cause heel pain.  Consider finding low-impact activities if you continue to have problems.  Lose weight if you need to. The best way to prevent plantar fasciitis is to avoid the activities that aggravate your plantar fascia. Contact a health care provider if:  Your symptoms do not go away after treatment with home care measures.  Your pain gets worse.  Your pain affects your ability to move or do your daily activities. This information is not intended to replace advice given to you by your health care  provider. Make sure you discuss any questions you have with your health care provider. Document Released: 04/07/2001 Document Revised: 12/16/2015 Document Reviewed: 05/23/2014 Elsevier Interactive Patient Education  2018 [place].  Plantar Fasciitis Rehab Ask your health care provider which exercises are safe for you. Do exercises exactly as told by your health care provider and adjust them as directed. It is normal to feel mild stretching, pulling, tightness, or discomfort as you do these exercises, but you should stop right away if you feel sudden pain or your pain gets worse. Do not begin these exercises until told by your health care provider. Stretching and range of motion exercises These exercises warm up your muscles and joints and improve the movement and flexibility of your foot. These exercises also help to relieve pain. Exercise A: Plantar fascia stretch  1. Sit with your left / right leg crossed over your opposite knee. 2. Hold your heel with one hand with that thumb near your arch. With your other hand, hold your toes and gently pull them back toward the top of your foot. You should feel a stretch on the bottom of your toes or your foot or both. 3. Hold this stretch for__________ seconds. 4. Slowly release your toes and return to the starting position. Repeat __________ times. Complete this exercise __________ times a day. Exercise B: Gastroc, standing  1. Stand with your hands against a wall. 2. Extend your left / right leg behind you, and bend your front knee slightly. 3. Keeping your heels on the floor and keeping your back knee straight, shift your weight toward the wall without arching your back. You should feel a gentle stretch in your left / right calf. 4. Hold this position for __________ seconds. Repeat __________ times. Complete this exercise __________ times a day. Exercise C: Soleus, standing 1. Stand with your hands against a wall. 2. Extend your left / right  leg behind you, and bend your front knee slightly. 3. Keeping your heels on the floor, bend your back knee and slightly shift your weight over the back leg. You should feel a gentle stretch deep in your calf. 4. Hold this position for __________ seconds. Repeat __________ times. Complete this exercise __________ times a day. Exercise D: Gastrocsoleus, standing 1. Stand with the ball of your left / right foot on a step. The ball of your foot is on the walking surface, right under your toes. 2. Keep your other foot firmly on the same step. 3. Hold onto the wall or a railing for balance. 4. Slowly lift your other  foot, allowing your body weight to press your heel down over the edge of the step. You should feel a stretch in your left / right calf. 5. Hold this position for __________ seconds. 6. Return both feet to the step. 7. Repeat this exercise with a slight bend in your left / right knee. Repeat __________ times with your left / right knee straight and __________ times with your left / right knee bent. Complete this exercise __________ times a day. Balance exercise This exercise builds your balance and strength control of your arch to help take pressure off your plantar fascia. Exercise E: Single leg stand 1. Without shoes, stand near a railing or in a doorway. You may hold onto the railing or door frame as needed. 2. Stand on your left / right foot. Keep your big toe down on the floor and try to keep your arch lifted. Do not let your foot roll inward. 3. Hold this position for __________ seconds. 4. If this exercise is too easy, you can try it with your eyes closed or while standing on a pillow. Repeat __________ times. Complete this exercise __________ times a day. This information is not intended to replace advice given to you by your health care provider. Make sure you discuss any questions you have with your health care provider. Document Released: 07/13/2005 Document Revised: 03/17/2016  Document Reviewed: 05/27/2015 Elsevier Interactive Patient Education  2018 [employer].

## 2018-05-27 ENCOUNTER — Other Ambulatory Visit: Payer: Self-pay | Admitting: Family Medicine

## 2018-05-27 DIAGNOSIS — Z1231 Encounter for screening mammogram for malignant neoplasm of breast: Secondary | ICD-10-CM

## 2018-06-22 ENCOUNTER — Ambulatory Visit
Admission: RE | Admit: 2018-06-22 | Discharge: 2018-06-22 | Disposition: A | Discharge status: Home / Self Care | Payer: Managed Care, Other (non HMO) | Source: Ambulatory Visit | Attending: Family Medicine | Admitting: Family Medicine

## 2018-06-22 DIAGNOSIS — Z1231 Encounter for screening mammogram for malignant neoplasm of breast: Secondary | ICD-10-CM | POA: Insufficient documentation

## 2018-09-22 ENCOUNTER — Ambulatory Visit: Payer: Managed Care, Other (non HMO) | Admitting: Family Medicine

## 2018-09-22 ENCOUNTER — Encounter: Payer: Self-pay | Admitting: Family Medicine

## 2018-09-22 ENCOUNTER — Other Ambulatory Visit: Payer: Self-pay

## 2018-09-22 VITALS — BP 143/85 | HR 73 | Temp 98.8°F | Ht 64.69 in | Wt 246.2 lb

## 2018-09-22 DIAGNOSIS — Z20828 Contact with and (suspected) exposure to other viral communicable diseases: Secondary | ICD-10-CM

## 2018-09-22 DIAGNOSIS — J069 Acute upper respiratory infection, unspecified: Secondary | ICD-10-CM | POA: Diagnosis not present

## 2018-09-22 MED ORDER — HYDROCOD POLST-CPM POLST ER 10-8 MG/5ML PO SUER: 5.0000 mL | Freq: Every evening | ORAL | 0 refills | Status: DC | PRN

## 2018-09-22 MED ORDER — BENZONATATE 200 MG PO CAPS: 200.0000 mg | ORAL_CAPSULE | Freq: Two times a day (BID) | ORAL | 0 refills | Status: DC | PRN

## 2018-09-22 NOTE — Progress Notes (Signed)
BP (!) 143/85 (BP Location: Left Arm, Patient Position: Sitting, Cuff Size: Large)   Pulse 73   Temp 98.8 F (37.1 C) (Oral)   Ht 5' 4.69" (1.643 m)   Wt 246 lb 3 oz (111.7 kg)   SpO2 96%   BMI 41.37 kg/m    Subjective:    Patient ID: [name], female    DOB: 11/22/1955, 63 y.o.   MRN: [id number]  HPI: AILYNN GOW is a 63 y.o. female  Chief Complaint  Patient presents with  . Cough    Pt states cough started this week. Husband was exposed to the flu. When Pt coughs, her back hurts   UPPER RESPIRATORY TRACT INFECTION Duration: 2 days Worst symptom: cough Fever: no Cough: yes Shortness of breath: no Wheezing: yes Chest pain: yes, with cough Chest tightness: no Chest congestion: yes Nasal congestion: yes Runny nose: yes Post nasal drip: yes Sneezing: no Sore throat: yes Swollen glands: no Sinus pressure: no Headache: yes Face pain: no Toothache: no Ear pain: no  Ear pressure: no  Eyes red/itching:yes Eye drainage/crusting: no  Vomiting: no Rash: no Fatigue: yes Sick contacts: yes Strep contacts: no  Context: worse Recurrent sinusitis: no Relief with OTC cold/cough medications: no  Treatments attempted: mucinex    Relevant past medical, surgical, family and social history reviewed and updated as indicated. Interim medical history since our last visit reviewed. Allergies and medications reviewed and updated.  Review of Systems  Constitutional: Positive for fatigue. Negative for activity change, appetite change, chills, diaphoresis, fever and unexpected weight change.  HENT: Positive for congestion, postnasal drip, rhinorrhea and sore throat. Negative for dental problem, drooling, ear discharge, ear pain, facial swelling, hearing loss, mouth sores, nosebleeds, sinus pressure, sinus pain, sneezing, tinnitus, trouble swallowing and voice change.   Eyes: Negative.   Respiratory: Positive for cough and chest tightness. Negative for apnea, choking,  shortness of breath, wheezing and stridor.   Cardiovascular: Negative.   Neurological: Negative.   Psychiatric/Behavioral: Negative.     Per HPI unless specifically indicated above     Objective:    BP (!) 143/85 (BP Location: Left Arm, Patient Position: Sitting, Cuff Size: Large)   Pulse 73   Temp 98.8 F (37.1 C) (Oral)   Ht 5' 4.69" (1.643 m)   Wt 246 lb 3 oz (111.7 kg)   SpO2 96%   BMI 41.37 kg/m   Wt Readings from Last 3 Encounters:  09/22/18 246 lb 3 oz (111.7 kg)  04/25/18 243 lb 6 oz (110.4 kg)  03/25/18 236 lb 8 oz (107.3 kg)    Physical Exam Vitals signs and nursing note reviewed.  Constitutional:      General: She is not in acute distress.    Appearance: Normal appearance. She is obese. She is not ill-appearing, toxic-appearing or diaphoretic.  HENT:     Head: Normocephalic and atraumatic.     Right Ear: Tympanic membrane, ear canal and external ear normal. There is no impacted cerumen.     Left Ear: Tympanic membrane, ear canal and external ear normal. There is no impacted cerumen.     Nose: Nose normal. No congestion or rhinorrhea.     Mouth/Throat:     Mouth: Mucous membranes are moist.     Pharynx: Oropharynx is clear. No oropharyngeal exudate or posterior oropharyngeal erythema.  Eyes:     General: No scleral icterus.       Right eye: No discharge.  Left eye: No discharge.     Extraocular Movements: Extraocular movements intact.     Conjunctiva/sclera: Conjunctivae normal.     Pupils: Pupils are equal, round, and reactive to light.  Neck:     Musculoskeletal: Normal range of motion and neck supple. No neck rigidity or muscular tenderness.     Vascular: No carotid bruit.  Cardiovascular:     Rate and Rhythm: Normal rate and regular rhythm.     Pulses: Normal pulses.     Heart sounds: Normal heart sounds. No murmur. No friction rub. No gallop.   Pulmonary:     Effort: Pulmonary effort is normal. No respiratory distress.     Breath sounds:  Normal breath sounds. No stridor. No wheezing, rhonchi or rales.  Chest:     Chest wall: No tenderness.  Musculoskeletal: Normal range of motion.  Lymphadenopathy:     Cervical: Cervical adenopathy present.  Skin:    General: Skin is warm and dry.     Capillary Refill: Capillary refill takes less than 2 seconds.     Coloration: Skin is not jaundiced or pale.     Findings: No bruising, erythema, lesion or rash.  Neurological:     General: No focal deficit present.     Mental Status: She is alert and oriented to person, place, and time. Mental status is at baseline.  Psychiatric:        Mood and Affect: Mood normal.        Behavior: Behavior normal.        Thought Content: Thought content normal.        Judgment: Judgment normal.     Results for orders placed or performed in visit on 03/30/18  Microscopic Examination  Result Value Ref Range   WBC, UA 0-5 0 - 5 /hpf   RBC, UA 0-2 0 - 2 /hpf   Epithelial Cells (non renal) 0-10 0 - 10 /hpf   Casts None seen None seen /lpf   Mucus, UA Present Not Estab.   Bacteria, UA None seen None seen/Few  Comprehensive metabolic panel  Result Value Ref Range   Glucose 91 65 - 99 mg/dL   BUN 12 8 - 27 mg/dL   Creatinine, Ser 0.79 0.57 - 1.00 mg/dL   GFR calc non Af Amer 79 >59 mL/min/1.73   GFR calc Af Amer 91 >59 mL/min/1.73   BUN/Creatinine Ratio 15 12 - 28   Sodium 144 134 - 144 mmol/L   Potassium 4.6 3.5 - 5.2 mmol/L   Chloride 104 96 - 106 mmol/L   CO2 24 20 - 29 mmol/L   Calcium 8.4 (L) 8.7 - 10.3 mg/dL   Total Protein 6.5 6.0 - 8.5 g/dL   Albumin 4.3 3.6 - 4.8 g/dL   Globulin, Total 2.2 1.5 - 4.5 g/dL   Albumin/Globulin Ratio 2.0 1.2 - 2.2   Bilirubin Total 0.2 0.0 - 1.2 mg/dL   Alkaline Phosphatase 92 39 - 117 IU/L   AST 35 0 - 40 IU/L   ALT 24 0 - 32 IU/L  CBC with Differential/Platelet  Result Value Ref Range   WBC 5.0 3.4 - 10.8 x10E3/uL   RBC 4.12 3.77 - 5.28 x10E6/uL   Hemoglobin 12.5 11.1 - 15.9 g/dL   Hematocrit  38.4 34.0 - 46.6 %   MCV 93 79 - 97 fL   MCH 30.3 26.6 - 33.0 pg   MCHC 32.6 31.5 - 35.7 g/dL   RDW 13.2 12.3 - 15.4 %  Platelets 194 150 - 450 x10E3/uL   Neutrophils 57 Not Estab. %   Lymphs 30 Not Estab. %   Monocytes 9 Not Estab. %   Eos 3 Not Estab. %   Basos 1 Not Estab. %   Neutrophils Absolute 2.9 1.4 - 7.0 x10E3/uL   Lymphocytes Absolute 1.5 0.7 - 3.1 x10E3/uL   Monocytes Absolute 0.5 0.1 - 0.9 x10E3/uL   EOS (ABSOLUTE) 0.1 0.0 - 0.4 x10E3/uL   Basophils Absolute 0.0 0.0 - 0.2 x10E3/uL   Immature Granulocytes 0 Not Estab. %   Immature Grans (Abs) 0.0 0.0 - 0.1 x10E3/uL  Hemoglobin A1c  Result Value Ref Range   Hgb A1c MFr Bld 5.8 (H) 4.8 - 5.6 %  VITAMIN D 25 Hydroxy (Vit-D Deficiency, Fractures)  Result Value Ref Range   Vit D, 25-Hydroxy 18.2 (L) 30.0 - 100.0 ng/mL  UA/M w/rflx Culture, Routine  Result Value Ref Range   Specific Gravity, UA 1.027 1.005 - 1.030   pH, UA 7.5 5.0 - 7.5   Color, UA Yellow Yellow   Appearance Ur Clear Clear   Leukocytes, UA Negative Negative   Protein, UA Negative Negative/Trace   Glucose, UA Negative Negative   Ketones, UA Negative Negative   RBC, UA Negative Negative   Bilirubin, UA Negative Negative   Urobilinogen, Ur 1.0 0.2 - 1.0 mg/dL   Nitrite, UA Negative Negative   Microscopic Examination Comment    Microscopic Examination See below:    Urinalysis Reflex Comment   TSH  Result Value Ref Range   TSH 4.410 0.450 - 4.500 uIU/mL  Lipid Panel With LDL/HDL Ratio  Result Value Ref Range   Cholesterol, Total 273 (H) 100 - 199 mg/dL   Triglycerides 97 0 - 149 mg/dL   HDL 74 >39 mg/dL   VLDL Cholesterol Cal 19 5 - 40 mg/dL   LDL Calculated 180 (H) 0 - 99 mg/dL   LDl/HDL Ratio 2.4 0.0 - 3.2 ratio  Microalbumin / creatinine urine ratio  Result Value Ref Range   Creatinine, Urine 206.2 Not Estab. mg/dL   Microalbumin, Urine 10.0 Not Estab. ug/mL   Microalb/Creat Ratio 4.8 0.0 - 30.0 mg/g creat      Assessment & Plan:    Problem List Items Addressed This Visit    None    Visit Diagnoses    Upper respiratory tract infection, unspecified type    -  Primary   Will treat with tessalon and tussionex. Call if not getting better or getting worse. Call with any concerns. Continue to monitor.    Exposure to the flu       Flu negative.    Relevant Orders   Veritor Flu A/B Waived       Follow up plan: Return As scheduled.

## 2018-09-23 LAB — VERITOR FLU A/B WAIVED
Influenza A: NEGATIVE
Influenza B: NEGATIVE

## 2018-09-26 ENCOUNTER — Ambulatory Visit: Payer: Managed Care, Other (non HMO) | Admitting: Family Medicine

## 2018-09-29 ENCOUNTER — Other Ambulatory Visit: Payer: Self-pay | Admitting: Family Medicine

## 2018-09-30 MED ORDER — BENZONATATE 200 MG PO CAPS: 200.0000 mg | ORAL_CAPSULE | Freq: Two times a day (BID) | ORAL | 0 refills | Status: DC | PRN

## 2018-10-20 ENCOUNTER — Ambulatory Visit: Payer: Managed Care, Other (non HMO) | Admitting: Family Medicine

## 2019-01-06 ENCOUNTER — Telehealth: Payer: Self-pay | Admitting: Family Medicine

## 2019-01-06 NOTE — Telephone Encounter (Signed)
Called pt to go over screening questions, no answer, left voicemail

## 2019-01-09 ENCOUNTER — Other Ambulatory Visit: Payer: Self-pay

## 2019-01-09 ENCOUNTER — Encounter: Payer: Self-pay | Admitting: Family Medicine

## 2019-01-09 ENCOUNTER — Ambulatory Visit: Payer: Managed Care, Other (non HMO) | Admitting: Family Medicine

## 2019-01-09 VITALS — BP 151/93 | HR 61 | Temp 98.4°F | Ht 65.0 in | Wt 252.0 lb

## 2019-01-09 DIAGNOSIS — E782 Mixed hyperlipidemia: Secondary | ICD-10-CM | POA: Diagnosis not present

## 2019-01-09 DIAGNOSIS — Z9884 Bariatric surgery status: Secondary | ICD-10-CM

## 2019-01-09 DIAGNOSIS — R7301 Impaired fasting glucose: Secondary | ICD-10-CM

## 2019-01-09 DIAGNOSIS — R768 Other specified abnormal immunological findings in serum: Secondary | ICD-10-CM

## 2019-01-09 DIAGNOSIS — F4310 Post-traumatic stress disorder, unspecified: Secondary | ICD-10-CM

## 2019-01-09 DIAGNOSIS — E559 Vitamin D deficiency, unspecified: Secondary | ICD-10-CM | POA: Diagnosis not present

## 2019-01-09 DIAGNOSIS — R030 Elevated blood-pressure reading, without diagnosis of hypertension: Secondary | ICD-10-CM

## 2019-01-09 MED ORDER — CYCLOBENZAPRINE HCL 10 MG PO TABS: 10.0000 mg | ORAL_TABLET | Freq: Every day | ORAL | 0 refills | Status: DC

## 2019-01-09 NOTE — Assessment & Plan Note (Signed)
Will check quantitative level and genotype to see if needs treatment.

## 2019-01-09 NOTE — Progress Notes (Deleted)
There were no vitals taken for this visit.   Subjective:    Patient ID: Sandra Mendez, female    DOB: February 13, 1956, 63 y.o.   MRN: [id number]  HPI: Sandra Mendez is a 63 y.o. female presenting on 01/09/2019 for comprehensive medical examination. Current medical complaints include:  Impaired Fasting Glucose HbA1C:  Lab Results  Component Value Date   HGBA1C 5.8 (H) 03/30/2018   Duration of elevated blood sugar:  Polydipsia: {Blank single:19197::"yes","no"} Polyuria: {Blank single:19197::"yes","no"} Weight change: {Blank single:19197::"yes","no"} Visual disturbance: {Blank single:19197::"yes","no"} Glucose Monitoring: {Blank single:19197::"yes","no"}    Accucheck frequency: {Blank single:19197::"Not Checking","Daily","BID","TID"}    Fasting glucose:     Post prandial:  Diabetic Education: {Blank single:19197::"Completed","Not Completed"} Family history of diabetes: {Blank single:19197::"yes","no"}  HYPERLIPIDEMIA Hyperlipidemia status: {Blank single:19197::"excellent compliance","good compliance","fair compliance","poor compliance"} Satisfied with current treatment?  {Blank single:19197::"yes","no"} Side effects:  {Blank single:19197::"yes","no"} Medication compliance: {Blank single:19197::"excellent compliance","good compliance","fair compliance","poor compliance"} Past cholesterol meds: {Blank multiple:19196::"none","atorvastain (lipitor)","lovastatin (mevacor)","pravastatin (pravachol)","rosuvastatin (crestor)","simvastatin (zocor)","vytorin","fenofibrate (tricor)","gemfibrozil","ezetimide (zetia)","niaspan","lovaza"} Supplements: {Blank multiple:19196::"none","fish oil","niacin","red yeast rice"} Aspirin:  {Blank single:19197::"yes","no"} The 10-year ASCVD risk score Mikey Bussing DC Jr., et al., 2013) is: 11.5%   Values used to calculate the score:     Age: 28 years     Sex: Female     Is Non-Hispanic African American: Yes     Diabetic: No     Tobacco smoker: No     Systolic  Blood Pressure: 143 mmHg     Is BP treated: No     HDL Cholesterol: 74 mg/dL     Total Cholesterol: 273 mg/dL Chest pain:  {Blank single:19197::"yes","no"} Coronary artery disease:  {Blank single:19197::"yes","no"} Family history CAD:  {Blank single:19197::"yes","no"} Family history early CAD:  {Blank single:19197::"yes","no"}  PTSD Duration:{Blank single:19197::"controlled","uncontrolled","better","worse","exacerbated","stable"} Anxious mood: {Blank single:19197::"yes","no"}  Excessive worrying: {Blank single:19197::"yes","no"} Irritability: {Blank single:19197::"yes","no"}  Sweating: {Blank single:19197::"yes","no"} Nausea: {Blank single:19197::"yes","no"} Palpitations:{Blank single:19197::"yes","no"} Hyperventilation: {Blank single:19197::"yes","no"} Panic attacks: {Blank single:19197::"yes","no"} Agoraphobia: {Blank single:19197::"yes","no"}  Obscessions/compulsions: {Blank single:19197::"yes","no"} Depressed mood: {Blank single:19197::"yes","no"} Depression screen [facility] 2/9 03/25/2018 03/19/2016  Decreased Interest 0 0  Down, Depressed, Hopeless 0 0  PHQ - 2 Score 0 0  Altered sleeping 0 -  Tired, decreased energy 0 -  Change in appetite 0 -  Feeling bad or failure about yourself  0 -  Trouble concentrating 0 -  Moving slowly or fidgety/restless 0 -  Suicidal thoughts 0 -  PHQ-9 Score 0 -  Difficult doing work/chores Not difficult at all -   Anhedonia: {Blank single:19197::"yes","no"} Weight changes: {Blank single:19197::"yes","no"} Insomnia: {Blank single:19197::"yes","no"} {Blank single:19197::"hard to fall asleep","hard to stay asleep"}  Hypersomnia: {Blank single:19197::"yes","no"} Fatigue/loss of energy: {Blank single:19197::"yes","no"} Feelings of worthlessness: {Blank single:19197::"yes","no"} Feelings of guilt: {Blank single:19197::"yes","no"} Impaired concentration/indecisiveness: {Blank single:19197::"yes","no"} Suicidal ideations: {Blank  single:19197::"yes","no"}  Crying spells: {Blank single:19197::"yes","no"} Recent Stressors/Life Changes: {Blank single:19197::"yes","no"}   Relationship problems: {Blank single:19197::"yes","no"}   Family stress: {Blank single:19197::"yes","no"}     Financial stress: {Blank single:19197::"yes","no"}    Job stress: {Blank single:19197::"yes","no"}    Recent death/loss: {Blank single:19197::"yes","no"}  HEPATITIS C Duration since diagnosis: {Blank single:19197::"chronic","unknown"} Hep C transmission:  Genotype:  Viral load:   Hepatology evaluation:{Blank single:19197::"yes","no"} Liver biopsy:{Blank single:19197::"yes","no"}  Cirrhosis: {Blank single:19197::"yes","no"} Antiviral therapy:{Blank single:19197::"yes","no"} Hepatocellular carcinoma screening: {Blank single:19197::"yes","no"} Esophageal varices screening/EGD: {Blank single:19197::"yes","no"} Hepatitis A Vaccine: {Blank single:19197::"Up to Date","Not up to Date","unknown"} Hepatitis B Vaccine: {Blank single:19197::"Up to Date","Not up to Date","unknown"} Pneumovax Vaccine: {Blank single:19197::"Up to Date","Not up to Date","unknown"}  She currently lives with: Menopausal Symptoms: {Blank single:19197::"yes","no"}  Depression Screen done today and results listed below:  Depression screen [facility] 2/9 03/25/2018 03/19/2016  Decreased Interest 0 0  Down, Depressed, Hopeless 0 0  PHQ - 2 Score 0 0  Altered sleeping 0 -  Tired, decreased energy 0 -  Change in appetite 0 -  Feeling bad or failure about yourself  0 -  Trouble concentrating 0 -  Moving slowly or fidgety/restless 0 -  Suicidal thoughts 0 -  PHQ-9 Score 0 -  Difficult doing work/chores Not difficult at all -    Past Medical History:  Past Medical History:  Diagnosis Date   History of colon polyps    Obesity    Vitamin D deficiency     Surgical History:  Past Surgical History:  Procedure Laterality Date   BARIATRIC SURGERY  2013   CHOLECYSTECTOMY      COLONOSCOPY WITH PROPOFOL N/A 04/21/2017   Procedure: COLONOSCOPY WITH PROPOFOL;  Surgeon: Toledo, Benay Pike, MD;  Location: [facility] ENDOSCOPY;  Service: Endoscopy;  Laterality: N/A;   WISDOM TOOTH EXTRACTION      Medications:  Current Outpatient Medications on File Prior to Visit  Medication Sig   benzonatate (TESSALON) 200 MG capsule Take 1 capsule (200 mg total) by mouth 2 (two) times daily as needed for cough.   busPIRone (BUSPAR) 5 MG tablet 1-2 tabs TID PRN anxiety (Patient not taking: Reported on 09/22/2018)   chlorpheniramine-HYDROcodone (TUSSIONEX PENNKINETIC ER) 10-8 MG/5ML SUER Take 5 mLs by mouth at bedtime as needed.   cyclobenzaprine (FLEXERIL) 10 MG tablet Take 1 tablet (10 mg total) by mouth 3 (three) times daily as needed for muscle spasms. (Patient not taking: Reported on 09/22/2018)   No current facility-administered medications on file prior to visit.     Allergies:  Allergies  Allergen Reactions   Erythromycin Rash   Mushroom Extract Complex    Penicillins Rash    Social History:  Social History   Socioeconomic History   Marital status: Married    Spouse name: Not on file   Number of children: Not on file   Years of education: Not on file   Highest education level: Not on file  Occupational History   Not on file  Social Needs   Financial resource strain: Not on file   Food insecurity    Worry: Not on file    Inability: Not on file   Transportation needs    Medical: Not on file    Non-medical: Not on file  Tobacco Use   Smoking status: Never Smoker   Smokeless tobacco: Never Used  Substance and Sexual Activity   Alcohol use: Yes    Alcohol/week: 0.0 standard drinks    Comment: no alcohol in 24hrs, drinks beer occasional   Drug use: No   Sexual activity: Not Currently  Lifestyle   Physical activity    Days per week: Not on file    Minutes per session: Not on file   Stress: Not on file  Relationships   Social  connections    Talks on phone: Not on file    Gets together: Not on file    Attends religious service: Not on file    Active member of club or organization: Not on file    Attends meetings of clubs or organizations: Not on file    Relationship status: Not on file   Intimate partner violence    Fear of current or ex partner: Not on file    Emotionally abused: Not on file    Physically abused: Not on file  Forced sexual activity: Not on file  Other Topics Concern   Not on file  Social History Narrative   Not on file   Social History   Tobacco Use  Smoking Status Never Smoker  Smokeless Tobacco Never Used   Social History   Substance and Sexual Activity  Alcohol Use Yes   Alcohol/week: 0.0 standard drinks   Comment: no alcohol in 24hrs, drinks beer occasional    Family History:  Family History  Problem Relation Age of Onset   Diabetes Mother    Stroke Father    Colon cancer Sister    Diabetes Sister    Diabetes Sister    Breast cancer Other 66    Past medical history, surgical history, medications, allergies, family history and social history reviewed with patient today and changes made to appropriate areas of the chart.   ROS  All other ROS negative except what is listed above and in the HPI.      Objective:    There were no vitals taken for this visit.  Wt Readings from Last 3 Encounters:  09/22/18 246 lb 3 oz (111.7 kg)  04/25/18 243 lb 6 oz (110.4 kg)  03/25/18 236 lb 8 oz (107.3 kg)    Physical Exam  Results for orders placed or performed in visit on 09/22/18  Veritor Flu A/B Waived  Result Value Ref Range   Influenza A Negative Negative   Influenza B Negative Negative      Assessment & Plan:   Problem List Items Addressed This Visit      Endocrine   IFG (impaired fasting glucose)     Other   Hyperlipidemia   Vitamin D deficiency   Morbid obesity ([place])   PTSD (post-traumatic stress disorder)    Other Visit Diagnoses     Routine general medical examination at a health care facility    -  Primary       Follow up plan: No follow-ups on file.   LABORATORY TESTING:  - Pap smear: up to date  IMMUNIZATIONS:   - Tdap: Tetanus vaccination status reviewed: last tetanus booster within 10 years. - Influenza: Refused - Pneumovax: Not applicable - Prevnar: Not applicable - HPV: Not applicable   SCREENING: -Mammogram: Up to date  - Colonoscopy: Up to date  - Bone Density: Not applicable   PATIENT COUNSELING:   Advised to take 1 mg of folate supplement per day if capable of pregnancy.   Sexuality: Discussed sexually transmitted diseases, partner selection, use of condoms, avoidance of unintended pregnancy  and contraceptive alternatives.   Advised to avoid cigarette smoking.  I discussed with the patient that most people either abstain from alcohol or drink within safe limits (<=14/week and <=4 drinks/occasion for males, <=7/weeks and <= 3 drinks/occasion for females) and that the risk for alcohol disorders and other health effects rises proportionally with the number of drinks per week and how often a drinker exceeds daily limits.  Discussed cessation/primary prevention of drug use and availability of treatment for abuse.   Diet: Encouraged to adjust caloric intake to maintain  or achieve ideal body weight, to reduce intake of dietary saturated fat and total fat, to limit sodium intake by avoiding high sodium foods and not adding table salt, and to maintain adequate dietary potassium and calcium preferably from fresh fruits, vegetables, and low-fat dairy products.    stressed the importance of regular exercise  Injury prevention: Discussed safety belts, safety helmets, smoke detector, smoking near bedding or upholstery.  Dental health: Discussed importance of regular tooth brushing, flossing, and dental visits.    NEXT PREVENTATIVE PHYSICAL DUE IN 1 YEAR. No follow-ups on file.

## 2019-01-09 NOTE — Assessment & Plan Note (Signed)
Not having an issue. No longer needing buspar for driving. Call with any concerns.

## 2019-01-09 NOTE — Assessment & Plan Note (Signed)
[facility] A[facility]. Continue to monitor.

## 2019-01-09 NOTE — Progress Notes (Signed)
BP (!) 151/93   Pulse 61   Temp 98.4 F (36.9 C) (Oral)   Ht 5\' 5"  (1.651 m)   Wt 252 lb (114.3 kg)   SpO2 97%   BMI 41.93 kg/m    Subjective:    Patient ID: Sandra Mendez, female    DOB: [birth year], 63 y.o.   MRN: [id number]  HPI: Sandra Mendez is a 63 y.o. female  Chief Complaint  Patient presents with  . Medication Refill    flexeril  . Hyperlipidemia    F/u  . IFG   Impaired Fasting Glucose HbA1C:  Lab Results  Component Value Date   HGBA1C 5.8 (H) 03/30/2018   Duration of elevated blood sugar: chronic Polydipsia: yes Polyuria: yes Weight change: no Visual disturbance: no Glucose Monitoring: no   Diabetic Education: Not Completed Family history of diabetes: no  HYPERLIPIDEMIA Hyperlipidemia status: stable Satisfied with current treatment?  Not on anything Past cholesterol meds: none Supplements: none Aspirin:  no The 10-year ASCVD risk score Mikey Bussing DC Jr., et al., 2013) is: 13.1%   Values used to calculate the score:     Age: 6 years     Sex: Female     Is Non-Hispanic African American: Yes     Diabetic: No     Tobacco smoker: No     Systolic Blood Pressure: 332 mmHg     Is BP treated: No     HDL Cholesterol: 74 mg/dL     Total Cholesterol: 273 mg/dL Chest pain:  no  HEPATITIS C Duration since diagnosis: 2 years- no levels in blood Genotype: None Viral load:  0 in 2018 Hepatology evaluation:no Liver biopsy:no  Cirrhosis: no Antiviral therapy:no Hepatocellular carcinoma screening: no Esophageal varices screening/EGD: no Hepatitis A Vaccine: Not up to Date Hepatitis B Vaccine: Not up to Date Pneumovax Vaccine: Not up to Date  Relevant past medical, surgical, family and social history reviewed and updated as indicated. Interim medical history since our last visit reviewed. Allergies and medications reviewed and updated.  Review of Systems  Constitutional: Negative.   Respiratory: Negative.   Cardiovascular: Negative.    Musculoskeletal: Negative.   Neurological: Negative.   Psychiatric/Behavioral: Negative.     Per HPI unless specifically indicated above     Objective:    BP (!) 151/93   Pulse 61   Temp 98.4 F (36.9 C) (Oral)   Ht 5\' 5"  (1.651 m)   Wt 252 lb (114.3 kg)   SpO2 97%   BMI 41.93 kg/m   Wt Readings from Last 3 Encounters:  01/09/19 252 lb (114.3 kg)  09/22/18 246 lb 3 oz (111.7 kg)  04/25/18 243 lb 6 oz (110.4 kg)    Physical Exam Vitals signs and nursing note reviewed.  Constitutional:      General: She is not in acute distress.    Appearance: Normal appearance. She is not ill-appearing, toxic-appearing or diaphoretic.  HENT:     Head: Normocephalic and atraumatic.     Right Ear: External ear normal.     Left Ear: External ear normal.     Nose: Nose normal.     Mouth/Throat:     Mouth: Mucous membranes are moist.     Pharynx: Oropharynx is clear.  Eyes:     General: No scleral icterus.       Right eye: No discharge.        Left eye: No discharge.     Extraocular Movements: Extraocular movements intact.  Conjunctiva/sclera: Conjunctivae normal.     Pupils: Pupils are equal, round, and reactive to light.  Neck:     Musculoskeletal: Normal range of motion and neck supple.  Cardiovascular:     Rate and Rhythm: Normal rate and regular rhythm.     Pulses: Normal pulses.     Heart sounds: Normal heart sounds. No murmur. No friction rub. No gallop.   Pulmonary:     Effort: Pulmonary effort is normal. No respiratory distress.     Breath sounds: Normal breath sounds. No stridor. No wheezing, rhonchi or rales.  Chest:     Chest wall: No tenderness.  Musculoskeletal: Normal range of motion.  Skin:    General: Skin is warm and dry.     Capillary Refill: Capillary refill takes less than 2 seconds.     Coloration: Skin is not jaundiced or pale.     Findings: No bruising, erythema, lesion or rash.  Neurological:     General: No focal deficit present.     Mental  Status: She is alert and oriented to person, place, and time. Mental status is at baseline.  Psychiatric:        Mood and Affect: Mood normal.        Behavior: Behavior normal.        Thought Content: Thought content normal.        Judgment: Judgment normal.     Results for orders placed or performed in visit on 09/22/18  Veritor Flu A/B Waived  Result Value Ref Range   Influenza A Negative Negative   Influenza B Negative Negative      Assessment & Plan:   Problem List Items Addressed This Visit      Endocrine   IFG (impaired fasting glucose) - Primary    Rechecking levels at the [facility]. Await results. Continue to monitor.       Relevant Orders   CBC with Differential/Platelet   Comprehensive metabolic panel   Microalbumin, Urine Waived   TSH   UA/M w/rflx Culture, Routine   Bayer DCA Hb A1c Waived   Amb Ref to Medical Weight Management     Other   Hyperlipidemia    Rechecking levels at the [facility]. Await results. Continue to monitor.       Relevant Orders   CBC with Differential/Platelet   Comprehensive metabolic panel   Lipid Panel w/o Chol/HDL Ratio   TSH   UA/M w/rflx Culture, Routine   Amb Ref to Medical Weight Management   Vitamin D deficiency    [facility] Await results. Continue to monitor.       Relevant Orders   CBC with Differential/Platelet   Comprehensive metabolic panel   TSH   UA/M w/rflx Culture, Routine   VITAMIN D 25 Hydroxy (Vit-D Deficiency, Fractures)   S/P bariatric surgery    Considering redoing her gastric bypass- she will discuss it with her surgeon. Encouraged her to discuss malabsorption issues- will get her into see medical weight management. New referral generated today.      Morbid obesity ([place])    Considering redoing her gastric bypass- she will discuss it with her surgeon. Encouraged her to discuss malabsorption issues- will get her into see medical weight  management. New referral generated today.      Relevant Orders   CBC with Differential/Platelet   Comprehensive metabolic panel   TSH   UA/M w/rflx Culture, Routine   Amb Ref to Medical  Weight Management   PTSD (post-traumatic stress disorder)    Not having an issue. No longer needing buspar for driving. Call with any concerns.       Relevant Orders   CBC with Differential/Platelet   Comprehensive metabolic panel   TSH   UA/M w/rflx Culture, Routine   Hepatitis C antibody test positive    Will check quantitative level and genotype to see if needs treatment.       Relevant Orders   HCV RNA quant   Hepatitis C Genotype    Other Visit Diagnoses    Elevated blood pressure reading       Will start DASH diet and work on dropping some weight. Recheck August at physical.   Relevant Orders   Amb Ref to Medical Weight Management       Follow up plan: Return After 03/26/19- Welcome to medicare physical.

## 2019-01-09 NOTE — Assessment & Plan Note (Signed)
Considering redoing her gastric bypass- she will discuss it with her surgeon. Encouraged her to discuss malabsorption issues- will get her into see medical weight management. New referral generated today.

## 2019-01-09 NOTE — Patient Instructions (Signed)
DASH Eating Plan  DASH stands for "Dietary Approaches to Stop Hypertension." The DASH eating plan is a healthy eating plan that has been shown to reduce high blood pressure (hypertension). It may also reduce your risk for type 2 diabetes, heart disease, and stroke. The DASH eating plan may also help with weight loss.  What are tips for following this plan?    General guidelines   Avoid eating more than 2,300 mg (milligrams) of salt (sodium) a day. If you have hypertension, you may need to reduce your sodium intake to 1,500 mg a day.   Limit alcohol intake to no more than 1 drink a day for nonpregnant women and 2 drinks a day for men. One drink equals 12 oz of beer, 5 oz of wine, or 1 oz of hard liquor.   Work with your health care provider to maintain a healthy body weight or to lose weight. Ask what an ideal weight is for you.   Get at least 30 minutes of exercise that causes your heart to beat faster (aerobic exercise) most days of the week. Activities may include walking, swimming, or biking.   Work with your health care provider or diet and nutrition specialist (dietitian) to adjust your eating plan to your individual calorie needs.  Reading food labels     Check food labels for the amount of sodium per serving. Choose foods with less than 5 percent of the Daily Value of sodium. Generally, foods with less than 300 mg of sodium per serving fit into this eating plan.   To find whole grains, look for the word "whole" as the first word in the ingredient list.  Shopping   Buy products labeled as "low-sodium" or "no salt added."   Buy fresh foods. Avoid canned foods and premade or frozen meals.  Cooking   Avoid adding salt when cooking. Use salt-free seasonings or herbs instead of table salt or sea salt. Check with your health care provider or pharmacist before using salt substitutes.   Do not fry foods. Cook foods using healthy methods such as baking, boiling, grilling, and broiling instead.   Cook with  heart-healthy oils, such as olive, canola, soybean, or sunflower oil.  Meal planning   Eat a balanced diet that includes:  ? 5 or more servings of fruits and vegetables each day. At each meal, try to fill half of your plate with fruits and vegetables.  ? Up to 6-8 servings of whole grains each day.  ? Less than 6 oz of lean meat, poultry, or fish each day. A 3-oz serving of meat is about the same size as a deck of cards. One egg equals 1 oz.  ? 2 servings of low-fat dairy each day.  ? A serving of nuts, seeds, or beans 5 times each week.  ? Heart-healthy fats. Healthy fats called Omega-3 fatty acids are found in foods such as flaxseeds and coldwater fish, like sardines, salmon, and mackerel.   Limit how much you eat of the following:  ? Canned or prepackaged foods.  ? Food that is high in trans fat, such as fried foods.  ? Food that is high in saturated fat, such as fatty meat.  ? Sweets, desserts, sugary drinks, and other foods with added sugar.  ? Full-fat dairy products.   Do not salt foods before eating.   Try to eat at least 2 vegetarian meals each week.   Eat more home-cooked food and less restaurant, buffet, and fast food.     When eating at a restaurant, ask that your food be prepared with less salt or no salt, if possible.  What foods are recommended?  The items listed may not be a complete list. Talk with your dietitian about what dietary choices are best for you.  Grains  Whole-grain or whole-wheat bread. Whole-grain or whole-wheat pasta. Brown rice. Oatmeal. Quinoa. Bulgur. Whole-grain and low-sodium cereals. Pita bread. Low-fat, low-sodium crackers. Whole-wheat flour tortillas.  Vegetables  Fresh or frozen vegetables (raw, steamed, roasted, or grilled). Low-sodium or reduced-sodium tomato and vegetable juice. Low-sodium or reduced-sodium tomato sauce and tomato paste. Low-sodium or reduced-sodium canned vegetables.  Fruits  All fresh, dried, or frozen fruit. Canned fruit in natural juice (without  added sugar).  Meat and other protein foods  Skinless chicken or turkey. Ground chicken or turkey. Pork with fat trimmed off. Fish and seafood. Egg whites. Dried beans, peas, or lentils. Unsalted nuts, nut butters, and seeds. Unsalted canned beans. Lean cuts of beef with fat trimmed off. Low-sodium, lean deli meat.  Dairy  Low-fat (1%) or fat-free (skim) milk. Fat-free, low-fat, or reduced-fat cheeses. Nonfat, low-sodium ricotta or cottage cheese. Low-fat or nonfat yogurt. Low-fat, low-sodium cheese.  Fats and oils  Soft margarine without trans fats. Vegetable oil. Low-fat, reduced-fat, or light mayonnaise and salad dressings (reduced-sodium). Canola, safflower, olive, soybean, and sunflower oils. Avocado.  Seasoning and other foods  Herbs. Spices. Seasoning mixes without salt. Unsalted popcorn and pretzels. Fat-free sweets.  What foods are not recommended?  The items listed may not be a complete list. Talk with your dietitian about what dietary choices are best for you.  Grains  Baked goods made with fat, such as croissants, muffins, or some breads. Dry pasta or rice meal packs.  Vegetables  Creamed or fried vegetables. Vegetables in a cheese sauce. Regular canned vegetables (not low-sodium or reduced-sodium). Regular canned tomato sauce and paste (not low-sodium or reduced-sodium). Regular tomato and vegetable juice (not low-sodium or reduced-sodium). Pickles. Olives.  Fruits  Canned fruit in a light or heavy syrup. Fried fruit. Fruit in cream or butter sauce.  Meat and other protein foods  Fatty cuts of meat. Ribs. Fried meat. Bacon. Sausage. Bologna and other processed lunch meats. Salami. Fatback. Hotdogs. Bratwurst. Salted nuts and seeds. Canned beans with added salt. Canned or smoked fish. Whole eggs or egg yolks. Chicken or turkey with skin.  Dairy  Whole or 2% milk, cream, and half-and-half. Whole or full-fat cream cheese. Whole-fat or sweetened yogurt. Full-fat cheese. Nondairy creamers. Whipped toppings.  Processed cheese and cheese spreads.  Fats and oils  Butter. Stick margarine. Lard. Shortening. Ghee. Bacon fat. Tropical oils, such as coconut, palm kernel, or palm oil.  Seasoning and other foods  Salted popcorn and pretzels. Onion salt, garlic salt, seasoned salt, table salt, and sea salt. Worcestershire sauce. Tartar sauce. Barbecue sauce. Teriyaki sauce. Soy sauce, including reduced-sodium. Steak sauce. Canned and packaged gravies. Fish sauce. Oyster sauce. Cocktail sauce. Horseradish that you find on the shelf. Ketchup. Mustard. Meat flavorings and tenderizers. Bouillon cubes. Hot sauce and Tabasco sauce. Premade or packaged marinades. Premade or packaged taco seasonings. Relishes. Regular salad dressings.  Where to find more information:   National Heart, Lung, and Blood Institute: www.nhlbi.nih.gov   American Heart Association: www.heart.org  Summary   The DASH eating plan is a healthy eating plan that has been shown to reduce high blood pressure (hypertension). It may also reduce your risk for type 2 diabetes, heart disease, and stroke.   With the   DASH eating plan, you should limit salt (sodium) intake to 2,300 mg a day. If you have hypertension, you may need to reduce your sodium intake to 1,500 mg a day.   When on the DASH eating plan, aim to eat more fresh fruits and vegetables, whole grains, lean proteins, low-fat dairy, and heart-healthy fats.   Work with your health care provider or diet and nutrition specialist (dietitian) to adjust your eating plan to your individual calorie needs.  This information is not intended to replace advice given to you by your health care provider. Make sure you discuss any questions you have with your health care provider.  Document Released: 07/02/2011 Document Revised: 07/06/2016 Document Reviewed: 07/06/2016  [employer]vier Interactive Patient Education  2019 [employer]

## 2019-01-11 ENCOUNTER — Other Ambulatory Visit: Payer: Self-pay | Admitting: Family Medicine

## 2019-01-11 NOTE — Telephone Encounter (Signed)
RX was sent in 2 days ago.

## 2019-01-12 ENCOUNTER — Other Ambulatory Visit: Payer: Self-pay

## 2019-01-12 ENCOUNTER — Other Ambulatory Visit: Payer: Self-pay | Admitting: Family Medicine

## 2019-01-12 ENCOUNTER — Ambulatory Visit: Payer: Managed Care, Other (non HMO) | Admitting: Adult Health

## 2019-01-12 ENCOUNTER — Other Ambulatory Visit: Payer: Managed Care, Other (non HMO)

## 2019-01-12 ENCOUNTER — Encounter: Payer: Self-pay | Admitting: Adult Health

## 2019-01-12 VITALS — BP 160/80 | HR 63 | Temp 98.6°F | Resp 18 | Ht 65.0 in | Wt 250.0 lb

## 2019-01-12 DIAGNOSIS — R7301 Impaired fasting glucose: Secondary | ICD-10-CM | POA: Diagnosis not present

## 2019-01-12 DIAGNOSIS — D1720 Benign lipomatous neoplasm of skin and subcutaneous tissue of unspecified limb: Secondary | ICD-10-CM | POA: Insufficient documentation

## 2019-01-12 DIAGNOSIS — Z008 Encounter for other general examination: Secondary | ICD-10-CM

## 2019-01-12 DIAGNOSIS — Z1159 Encounter for screening for other viral diseases: Secondary | ICD-10-CM

## 2019-01-12 DIAGNOSIS — Z0189 Encounter for other specified special examinations: Secondary | ICD-10-CM

## 2019-01-12 DIAGNOSIS — F4310 Post-traumatic stress disorder, unspecified: Secondary | ICD-10-CM

## 2019-01-12 DIAGNOSIS — E782 Mixed hyperlipidemia: Secondary | ICD-10-CM

## 2019-01-12 DIAGNOSIS — R632 Polyphagia: Secondary | ICD-10-CM | POA: Insufficient documentation

## 2019-01-12 DIAGNOSIS — E559 Vitamin D deficiency, unspecified: Secondary | ICD-10-CM

## 2019-01-12 DIAGNOSIS — R768 Other specified abnormal immunological findings in serum: Secondary | ICD-10-CM

## 2019-01-12 NOTE — Patient Instructions (Signed)
I will have the office call you on your glucose and cholesterol results when they return if you have not heard within 1 week please call the office.  This biometric physical is a brief physical and the only labs done are glucose and your lipid panel(cholesterol) and is  not a substitute for seeing a primary care provider for a complete annual physical. Please see a primary care physician for routine health maintenance, labs and full physical at least yearly and follow up as recommended by your provider. Provider also recommends if you do not have a primary care provider for patient to establish care as soon as possible .Patient may chose provider of choice. Also gave the [place]  PHYSICIAN/PROVIDER  REFERRAL LINE at [phone]- [phone] or web site at [email or web address] to help assist with finding a primary care doctor.  Patient verbalizes understanding that his office is acute care only and not a substitute for a primary care or for the management of chronic conditions.    Follow up with primary care as needed for chronic and maintenance health care- can be seen in this employee clinic for acute care.    Health Maintenance, Female Adopting a healthy lifestyle and getting preventive care can go a long way to promote health and wellness. Talk with your health care provider about what schedule of regular examinations is right for you. This is a good chance for you to check in with your provider about disease prevention and staying healthy. In between checkups, there are plenty of things you can do on your own. Experts have done a lot of research about which lifestyle changes and preventive measures are most likely to keep you healthy. Ask your health care provider for more information. Weight and diet Eat a healthy diet  Be sure to include plenty of vegetables, fruits, low-fat dairy products, and lean protein.  Do not eat a lot of foods high in solid fats, added sugars, or salt.  Get regular exercise.  This is one of the most important things you can do for your health. ? Most adults should exercise for at least 150 minutes each week. The exercise should increase your heart rate and make you sweat (moderate-intensity exercise). ? Most adults should also do strengthening exercises at least twice a week. This is in addition to the moderate-intensity exercise. Maintain a healthy weight  Body mass index (BMI) is a measurement that can be used to identify possible weight problems. It estimates body fat based on height and weight. Your health care provider can help determine your BMI and help you achieve or maintain a healthy weight.  For females 20 years of age and older: ? A BMI below 18.5 is considered underweight. ? A BMI of 18.5 to 24.9 is normal. ? A BMI of 25 to 29.9 is considered overweight. ? A BMI of 30 and above is considered obese. Watch levels of cholesterol and blood lipids  You should start having your blood tested for lipids and cholesterol at 65 years of age, then have this test every 5 years.  You may need to have your cholesterol levels checked more often if: ? Your lipid or cholesterol levels are high. ? You are older than 65 years of age. ? You are at high risk for heart disease. Cancer screening Lung Cancer  Lung cancer screening is recommended for adults 55-80 years old who are at high risk for lung cancer because of a history of smoking.  A yearly low-dose CT scan   of the lungs is recommended for people who: ? Currently smoke. ? Have quit within the past 15 years. ? Have at least a 30-pack-year history of smoking. A pack year is smoking an average of one pack of cigarettes a day for 1 year.  Yearly screening should continue until it has been 15 years since you quit.  Yearly screening should stop if you develop a health problem that would prevent you from having lung cancer treatment. Breast Cancer  Practice breast self-awareness. This means understanding how your  breasts normally appear and feel.  It also means doing regular breast self-exams. Let your health care provider know about any changes, no matter how small.  If you are in your 20s or 30s, you should have a clinical breast exam (CBE) by a health care provider every 1-3 years as part of a regular health exam.  If you are 40 or older, have a CBE every year. Also consider having a breast X-ray (mammogram) every year.  If you have a family history of breast cancer, talk to your health care provider about genetic screening.  If you are at high risk for breast cancer, talk to your health care provider about having an MRI and a mammogram every year.  Breast cancer gene (BRCA) assessment is recommended for women who have family members with BRCA-related cancers. BRCA-related cancers include: ? Breast. ? Ovarian. ? Tubal. ? Peritoneal cancers.  Results of the assessment will determine the need for genetic counseling and BRCA1 and BRCA2 testing. Cervical Cancer Your health care provider may recommend that you be screened regularly for cancer of the pelvic organs (ovaries, uterus, and vagina). This screening involves a pelvic examination, including checking for microscopic changes to the surface of your cervix (Pap test). You may be encouraged to have this screening done every 3 years, beginning at age 21.  For women ages 30-65, health care providers may recommend pelvic exams and Pap testing every 3 years, or they may recommend the Pap and pelvic exam, combined with testing for human papilloma virus (HPV), every 5 years. Some types of HPV increase your risk of cervical cancer. Testing for HPV may also be done on women of any age with unclear Pap test results.  Other health care providers may not recommend any screening for nonpregnant women who are considered low risk for pelvic cancer and who do not have symptoms. Ask your health care provider if a screening pelvic exam is right for you.  If you  have had past treatment for cervical cancer or a condition that could lead to cancer, you need Pap tests and screening for cancer for at least 20 years after your treatment. If Pap tests have been discontinued, your risk factors (such as having a new sexual partner) need to be reassessed to determine if screening should resume. Some women have medical problems that increase the chance of getting cervical cancer. In these cases, your health care provider may recommend more frequent screening and Pap tests. Colorectal Cancer  This type of cancer can be detected and often prevented.  Routine colorectal cancer screening usually begins at 65 years of age and continues through 65 years of age.  Your health care provider may recommend screening at an earlier age if you have risk factors for colon cancer.  Your health care provider may also recommend using home test kits to check for hidden blood in the stool.  A small camera at the end of a tube can be used to examine   your colon directly (sigmoidoscopy or colonoscopy). This is done to check for the earliest forms of colorectal cancer.  Routine screening usually begins at age 50.  Direct examination of the colon should be repeated every 5-10 years through 65 years of age. However, you may need to be screened more often if early forms of precancerous polyps or small growths are found. Skin Cancer  Check your skin from head to toe regularly.  Tell your health care provider about any new moles or changes in moles, especially if there is a change in a mole's shape or color.  Also tell your health care provider if you have a mole that is larger than the size of a pencil eraser.  Always use sunscreen. Apply sunscreen liberally and repeatedly throughout the day.  Protect yourself by wearing long sleeves, pants, a wide-brimmed hat, and sunglasses whenever you are outside. Heart disease, diabetes, and high blood pressure  High blood pressure causes heart  disease and increases the risk of stroke. High blood pressure is more likely to develop in: ? People who have blood pressure in the high end of the normal range (130-139/85-89 mm Hg). ? People who are overweight or obese. ? People who are African American.  If you are 18-39 years of age, have your blood pressure checked every 3-5 years. If you are 40 years of age or older, have your blood pressure checked every year. You should have your blood pressure measured twice-once when you are at a hospital or clinic, and once when you are not at a hospital or clinic. Record the average of the two measurements. To check your blood pressure when you are not at a hospital or clinic, you can use: ? An automated blood pressure machine at a pharmacy. ? A home blood pressure monitor.  If you are between 55 years and 79 years old, ask your health care provider if you should take aspirin to prevent strokes.  Have regular diabetes screenings. This involves taking a blood sample to check your fasting blood sugar level. ? If you are at a normal weight and have a low risk for diabetes, have this test once every three years after 65 years of age. ? If you are overweight and have a high risk for diabetes, consider being tested at a younger age or more often. Preventing infection Hepatitis B  If you have a higher risk for hepatitis B, you should be screened for this virus. You are considered at high risk for hepatitis B if: ? You were born in a country where hepatitis B is common. Ask your health care provider which countries are considered high risk. ? Your parents were born in a high-risk country, and you have not been immunized against hepatitis B (hepatitis B vaccine). ? You have HIV or AIDS. ? You use needles to inject street drugs. ? You live with someone who has hepatitis B. ? You have had sex with someone who has hepatitis B. ? You get hemodialysis treatment. ? You take certain medicines for conditions,  including cancer, organ transplantation, and autoimmune conditions. Hepatitis C  Blood testing is recommended for: ? Everyone born from 1945 through 1965. ? Anyone with known risk factors for hepatitis C. Sexually transmitted infections (STIs)  You should be screened for sexually transmitted infections (STIs) including gonorrhea and chlamydia if: ? You are sexually active and are younger than 65 years of age. ? You are older than 65 years of age and your health care provider tells   you that you are at risk for this type of infection. ? Your sexual activity has changed since you were last screened and you are at an increased risk for chlamydia or gonorrhea. Ask your health care provider if you are at risk.  If you do not have HIV, but are at risk, it may be recommended that you take a prescription medicine daily to prevent HIV infection. This is called pre-exposure prophylaxis (PrEP). You are considered at risk if: ? You are sexually active and do not regularly use condoms or know the HIV status of your partner(s). ? You take drugs by injection. ? You are sexually active with a partner who has HIV. Talk with your health care provider about whether you are at high risk of being infected with HIV. If you choose to begin PrEP, you should first be tested for HIV. You should then be tested every 3 months for as long as you are taking PrEP. Pregnancy  If you are premenopausal and you may become pregnant, ask your health care provider about preconception counseling.  If you may become pregnant, take 400 to 800 micrograms (mcg) of folic acid every day.  If you want to prevent pregnancy, talk to your health care provider about birth control (contraception). Osteoporosis and menopause  Osteoporosis is a disease in which the bones lose minerals and strength with aging. This can result in serious bone fractures. Your risk for osteoporosis can be identified using a bone density scan.  If you are 65  years of age or older, or if you are at risk for osteoporosis and fractures, ask your health care provider if you should be screened.  Ask your health care provider whether you should take a calcium or vitamin D supplement to lower your risk for osteoporosis.  Menopause may have certain physical symptoms and risks.  Hormone replacement therapy may reduce some of these symptoms and risks. Talk to your health care provider about whether hormone replacement therapy is right for you. Follow these instructions at home:  Schedule regular health, dental, and eye exams.  Stay current with your immunizations.  Do not use any tobacco products including cigarettes, chewing tobacco, or electronic cigarettes.  If you are pregnant, do not drink alcohol.  If you are breastfeeding, limit how much and how often you drink alcohol.  Limit alcohol intake to no more than 1 drink per day for nonpregnant women. One drink equals 12 ounces of beer, 5 ounces of wine, or 1 ounces of hard liquor.  Do not use street drugs.  Do not share needles.  Ask your health care provider for help if you need support or information about quitting drugs.  Tell your health care provider if you often feel depressed.  Tell your health care provider if you have ever been abused or do not feel safe at home. This information is not intended to replace advice given to you by your health care provider. Make sure you discuss any questions you have with your health care provider. Document Released: 01/26/2011 Document Revised: 12/19/2015 Document Reviewed: 04/16/2015 Elsevier Interactive Patient Education  2019 [employer] Inc.  

## 2019-01-12 NOTE — Progress Notes (Addendum)
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

## 2019-01-12 NOTE — Progress Notes (Signed)
Allergies  Allergen Reactions  . Erythromycin Rash  . Mushroom Extract Complex   . Penicillins Rash   [place] DOB: 63 y.o. MRN: [id number]  Subjective:  Here for Biometric Screen/brief exam Patient is a 63 year old female who comes to the clinic for her brief biometric exam.  She is also having labs drawn today for her primary care provider. Park Liter P, DO is her primary care provider and she does see her regularly.  Her blood pressure is elevated today she rushed in and went to the wrong building before this office visit, she reports her blood pressure is usually normal and she has follow-ups with her primary care.  She has a history of having the route bypass completed over 7 years ago and reports that she has gained almost all of her weight back, she has been in contact with a gastric surgeon and is considering having additional surgeries. She is trying to watch what she eats, but she reports that she has a hard time losing weight.  Minimal exercise. Patient  denies any fever, body aches,chills, rash, chest pain, shortness of breath, nausea, vomiting, or diarrhea.   Objective: Blood pressure (!) 160/80, pulse 63, temperature 98.6 F (37 C), temperature source Oral, resp. rate 18, height 5\' 5"  (1.651 m), weight 250 lb (113.4 kg), SpO2 98 %. NAD HEENT: Within normal limits Neck: Normal, supple  Heart: Regular rate and rhythm Lungs: Clear  Assessment: Biometric screen   Plan: Monitor blood pressure readings at home, and report any abnormals with given parameters to your primary care provider for further instructions/evaluation. Fasting glucose and lipids. Discussed with patient that today's visit here is a limited biometric screening visit (not a comprehensive exam or management of any chronic problems) Discussed some health issues, including healthy eating habits and exercise. Encouraged to follow-up with  PCP for annual comprehensive preventive and wellness care (and if applicable, any chronic issues). Questions invited and answered.  Follow up with primary care as needed for chronic and maintenance health care- can be seen in this employee clinic for acute care.   I will have the office call you on your glucose and cholesterol results when they return if you have not heard within 1 week please call the office.  This biometric physical is a brief physical and the only labs done are glucose and your lipid panel(cholesterol) and is  not a substitute for seeing a primary care provider for a complete annual physical. Please see a primary care physician for routine health maintenance, labs and full physical at least yearly and follow up as recommended by your provider. Provider also recommends if you do not have a primary care provider for patient to establish care as soon as possible .Patient may chose provider of choice. Also gave the [place] at [phone] or web site at [email or web address] to help assist with finding a primary care doctor.  Patient verbalizes understanding that his office is acute care only and not a substitute for a primary care or for the management of chronic conditions.

## 2019-01-13 ENCOUNTER — Other Ambulatory Visit: Payer: Self-pay

## 2019-01-13 DIAGNOSIS — Z0000 Encounter for general adult medical examination without abnormal findings: Secondary | ICD-10-CM

## 2019-01-13 DIAGNOSIS — R7301 Impaired fasting glucose: Secondary | ICD-10-CM

## 2019-01-13 DIAGNOSIS — E782 Mixed hyperlipidemia: Secondary | ICD-10-CM

## 2019-01-13 LAB — MICROALBUMIN / CREATININE URINE RATIO
Creatinine, Urine: 250.8 mg/dL
Microalb/Creat Ratio: 3 mg/g creat (ref 0–29)
Microalbumin, Urine: 8.7 ug/mL

## 2019-01-13 LAB — CBC WITH DIFFERENTIAL/PLATELET
BASO%: 0 %
BASO(ABSOLUTE): 0
EOS (ABSOLUTE): 0.1
EOS: 1 %
HCT: 38 (ref 29–41)
Hemoglobin: 12.5
Immature Granulocytes: 0
LYMPH: 32 %
Lymphs(Absolute): 1.4
MCH: 30
MCHC: 33.1
MCV: 91 (ref 76–111)
Monocytes(Absolute): 0.4
Monocytes: 8
Neutro Abs: 2.6
Neutrophils: 58
Platelets: 200
RBC: 4.17 (ref 3.87–5.11)
RDW: 13.1
WBC: 4.6

## 2019-01-13 NOTE — Addendum Note (Signed)
Addended by: Judie Petit on: 01/13/2019 02:14 PM   Modules accepted: Orders

## 2019-01-13 NOTE — Addendum Note (Signed)
Addended by: Judie Petit on: 01/13/2019 02:29 PM   Modules accepted: Orders

## 2019-01-13 NOTE — Addendum Note (Signed)
Addended by: Judie Petit on: 01/13/2019 02:57 PM   Modules accepted: Orders

## 2019-01-14 LAB — MICROSCOPIC EXAMINATION

## 2019-01-14 LAB — UA/M W/RFLX CULTURE, COMP
Bilirubin, UA: NEGATIVE
Glucose, UA: NEGATIVE
Ketones, UA: NEGATIVE
Leukocytes,UA: NEGATIVE
Nitrite, UA: NEGATIVE
Protein,UA: NEGATIVE
RBC, UA: NEGATIVE
Specific Gravity, UA: 1.021 (ref 1.005–1.030)
Urobilinogen, Ur: 0.2 mg/dL (ref 0.2–1.0)
pH, UA: 5 (ref 5.0–7.5)

## 2019-01-14 LAB — HCV RT-PCR, QUANT (GRAPH)

## 2019-01-20 ENCOUNTER — Encounter: Payer: Self-pay | Admitting: Family Medicine

## 2019-01-20 LAB — CBC WITH DIFFERENTIAL/PLATELET
Basophils Absolute: 0 10*3/uL (ref 0.0–0.2)
Basos: 0 %
EOS (ABSOLUTE): 0.1 10*3/uL (ref 0.0–0.4)
Eos: 2 %
Hematocrit: 37.8 % (ref 34.0–46.6)
Hemoglobin: 12.5 g/dL (ref 11.1–15.9)
Immature Grans (Abs): 0 10*3/uL (ref 0.0–0.1)
Immature Granulocytes: 0 %
Lymphocytes Absolute: 1.4 10*3/uL (ref 0.7–3.1)
Lymphs: 32 %
MCH: 30 pg (ref 26.6–33.0)
MCHC: 33.1 g/dL (ref 31.5–35.7)
MCV: 91 fL (ref 79–97)
Monocytes Absolute: 0.4 10*3/uL (ref 0.1–0.9)
Monocytes: 8 %
Neutrophils Absolute: 2.6 10*3/uL (ref 1.4–7.0)
Neutrophils: 58 %
Platelets: 200 10*3/uL (ref 150–450)
RBC: 4.17 x10E6/uL (ref 3.77–5.28)
RDW: 13.1 % (ref 11.7–15.4)
WBC: 4.6 10*3/uL (ref 3.4–10.8)

## 2019-01-20 LAB — COMPREHENSIVE METABOLIC PANEL
ALT: 12 IU/L (ref 0–32)
AST: 19 IU/L (ref 0–40)
Albumin/Globulin Ratio: 1.7 (ref 1.2–2.2)
Albumin: 4.3 g/dL (ref 3.8–4.8)
Alkaline Phosphatase: 88 IU/L (ref 39–117)
BUN/Creatinine Ratio: 13 (ref 12–28)
BUN: 10 mg/dL (ref 8–27)
Bilirubin Total: 0.3 mg/dL (ref 0.0–1.2)
CO2: 25 mmol/L (ref 20–29)
Calcium: 9.2 mg/dL (ref 8.7–10.3)
Chloride: 106 mmol/L (ref 96–106)
Creatinine, Ser: 0.78 mg/dL (ref 0.57–1.00)
GFR calc Af Amer: 92 mL/min/{1.73_m2} (ref >59)
GFR calc non Af Amer: 80 mL/min/{1.73_m2} (ref >59)
Globulin, Total: 2.5 g/dL (ref 1.5–4.5)
Glucose: 90 mg/dL (ref 65–99)
Potassium: 3.8 mmol/L (ref 3.5–5.2)
Sodium: 143 mmol/L (ref 134–144)
Total Protein: 6.8 g/dL (ref 6.0–8.5)

## 2019-01-20 LAB — HCV RT-PCR, QUANT (GRAPH): Hepatitis C Quantitation: NOT DETECTED IU/mL

## 2019-01-20 LAB — LIPID PANEL WITH LDL/HDL RATIO
Cholesterol, Total: 274 mg/dL — ABNORMAL HIGH (ref 100–199)
HDL: 70 mg/dL (ref >39)
LDL Calculated: 185 mg/dL — ABNORMAL HIGH (ref 0–99)
LDl/HDL Ratio: 2.6 ratio (ref 0.0–3.2)
Triglycerides: 97 mg/dL (ref 0–149)
VLDL Cholesterol Cal: 19 mg/dL (ref 5–40)

## 2019-01-20 LAB — HEPATITIS C GENOTYPE

## 2019-01-20 LAB — TSH: TSH: 3.89 u[IU]/mL (ref 0.450–4.500)

## 2019-01-20 LAB — VITAMIN D 25 HYDROXY (VIT D DEFICIENCY, FRACTURES): Vit D, 25-Hydroxy: 23.5 ng/mL — ABNORMAL LOW (ref 30.0–100.0)

## 2019-01-20 LAB — HGB A1C W/O EAG: Hgb A1c MFr Bld: 5.8 % — ABNORMAL HIGH (ref 4.8–5.6)

## 2019-03-21 ENCOUNTER — Ambulatory Visit: Payer: Managed Care, Other (non HMO) | Admitting: Nurse Practitioner

## 2019-03-28 ENCOUNTER — Ambulatory Visit: Payer: Managed Care, Other (non HMO) | Admitting: Family Medicine

## 2019-04-25 ENCOUNTER — Telehealth: Payer: Self-pay

## 2019-04-25 NOTE — Telephone Encounter (Signed)
Spoke with patient in regards to see if she wanted her pneumonia vaccine. Patient also due for influenza vaccination.  Patient declined both and said she did not want either.  Explained to patient if she changed her mind to let us know. Patient verbalized understanding.

## 2019-06-14 DIAGNOSIS — Z6841 Body Mass Index (BMI) 40.0 and over, adult: Secondary | ICD-10-CM | POA: Diagnosis not present

## 2019-06-14 DIAGNOSIS — Z9884 Bariatric surgery status: Secondary | ICD-10-CM | POA: Diagnosis not present

## 2019-06-14 DIAGNOSIS — G4733 Obstructive sleep apnea (adult) (pediatric): Secondary | ICD-10-CM | POA: Insufficient documentation

## 2019-06-16 ENCOUNTER — Ambulatory Visit (INDEPENDENT_AMBULATORY_CARE_PROVIDER_SITE_OTHER): Payer: Medicare HMO | Admitting: Family Medicine

## 2019-06-16 ENCOUNTER — Other Ambulatory Visit: Payer: Self-pay | Admitting: Family Medicine

## 2019-06-16 ENCOUNTER — Other Ambulatory Visit: Payer: Self-pay

## 2019-06-16 ENCOUNTER — Encounter: Payer: Self-pay | Admitting: Family Medicine

## 2019-06-16 VITALS — BP 133/78 | HR 72 | Temp 99.0°F | Ht 65.35 in | Wt 246.0 lb

## 2019-06-16 DIAGNOSIS — E559 Vitamin D deficiency, unspecified: Secondary | ICD-10-CM

## 2019-06-16 DIAGNOSIS — F4310 Post-traumatic stress disorder, unspecified: Secondary | ICD-10-CM

## 2019-06-16 DIAGNOSIS — Z13820 Encounter for screening for osteoporosis: Secondary | ICD-10-CM | POA: Diagnosis not present

## 2019-06-16 DIAGNOSIS — R768 Other specified abnormal immunological findings in serum: Secondary | ICD-10-CM

## 2019-06-16 DIAGNOSIS — E782 Mixed hyperlipidemia: Secondary | ICD-10-CM | POA: Diagnosis not present

## 2019-06-16 DIAGNOSIS — Z7189 Other specified counseling: Secondary | ICD-10-CM | POA: Diagnosis not present

## 2019-06-16 DIAGNOSIS — Z0000 Encounter for general adult medical examination without abnormal findings: Secondary | ICD-10-CM | POA: Diagnosis not present

## 2019-06-16 DIAGNOSIS — Z1231 Encounter for screening mammogram for malignant neoplasm of breast: Secondary | ICD-10-CM

## 2019-06-16 DIAGNOSIS — R7301 Impaired fasting glucose: Secondary | ICD-10-CM

## 2019-06-16 DIAGNOSIS — R69 Illness, unspecified: Secondary | ICD-10-CM | POA: Diagnosis not present

## 2019-06-16 LAB — UA/M W/RFLX CULTURE, ROUTINE
Bilirubin, UA: NEGATIVE
Glucose, UA: NEGATIVE
Ketones, UA: NEGATIVE
Leukocytes,UA: NEGATIVE
Nitrite, UA: NEGATIVE
Protein,UA: NEGATIVE
RBC, UA: NEGATIVE
Specific Gravity, UA: 1.03 (ref 1.005–1.030)
Urobilinogen, Ur: 0.2 mg/dL (ref 0.2–1.0)
pH, UA: 5 (ref 5.0–7.5)

## 2019-06-16 LAB — MICROALBUMIN, URINE WAIVED
Creatinine, Urine Waived: 300 mg/dL (ref 10–300)
Microalb, Ur Waived: 30 mg/L — ABNORMAL HIGH (ref 0–19)
Microalb/Creat Ratio: <30 mg/g (ref <30)

## 2019-06-16 LAB — BAYER DCA HB A1C WAIVED: HB A1C (BAYER DCA - WAIVED): 6 % (ref <7.0)

## 2019-06-16 NOTE — Patient Instructions (Addendum)
Preventative Services:  AAA screening: N/A Health Risk Assessment and Personalized Prevention Plan: Done today Bone Mass Measurements: Ordered today Breast Cancer Screening: Ordered today CVD Screening: Done today Cervical Cancer Screening: Up to date Colon Cancer Screening: Up to date Depression Screening: Done today Diabetes Screening: Done today Glaucoma Screening: See your eye doctor Hepatitis B vaccine: N/A Hepatitis C screening: Done previously- checking for virus today HIV Screening: N/A Flu Vaccine: Declined Lung cancer Screening: N/A Obesity Screening: Done today Pneumonia Vaccines (2): Declined STI Screening: N/A  Call for your mammogram and bone density: [facility] at [facility]  Address: [place], [place], [place] [address]  Phone: [phone]   Health Maintenance After Age 49 After age 69, you are at a higher risk for certain long-term diseases and infections as well as injuries from falls. Falls are a major cause of broken bones and head injuries in people who are older than age 35. Getting regular preventive care can help to keep you healthy and well. Preventive care includes getting regular testing and making lifestyle changes as recommended by your health care provider. Talk with your health care provider about:  Which screenings and tests you should have. A screening is a test that checks for a disease when you have no symptoms.  A diet and exercise plan that is right for you. What should I know about screenings and tests to prevent falls? Screening and testing are the best ways to find a health problem early. Early diagnosis and treatment give you the best chance of managing medical conditions that are common after age 63. Certain conditions and lifestyle choices may make you more likely to have a fall. Your health care provider may recommend:  Regular vision checks. Poor vision and conditions such as cataracts can make you more  likely to have a fall. If you wear glasses, make sure to get your prescription updated if your vision changes.  Medicine review. Work with your health care provider to regularly review all of the medicines you are taking, including over-the-counter medicines. Ask your health care provider about any side effects that may make you more likely to have a fall. Tell your health care provider if any medicines that you take make you feel dizzy or sleepy.  Osteoporosis screening. Osteoporosis is a condition that causes the bones to get weaker. This can make the bones weak and cause them to break more easily.  Blood pressure screening. Blood pressure changes and medicines to control blood pressure can make you feel dizzy.  Strength and balance checks. Your health care provider may recommend certain tests to check your strength and balance while standing, walking, or changing positions.  Foot health exam. Foot pain and numbness, as well as not wearing proper footwear, can make you more likely to have a fall.  Depression screening. You may be more likely to have a fall if you have a fear of falling, feel emotionally low, or feel unable to do activities that you used to do.  Alcohol use screening. Using too much alcohol can affect your balance and may make you more likely to have a fall. What actions can I take to lower my risk of falls? General instructions  Talk with your health care provider about your risks for falling. Tell your health care provider if: ? You fall. Be sure to tell your health care provider about all falls, even ones that seem minor. ? You feel dizzy, sleepy, or off-balance.  Take over-the-counter  and prescription medicines only as told by your health care provider. These include any supplements.  Eat a healthy diet and maintain a healthy weight. A healthy diet includes low-fat dairy products, low-fat (lean) meats, and fiber from whole grains, beans, and lots of fruits and  vegetables. Home safety  Remove any tripping hazards, such as rugs, cords, and clutter.  Install safety equipment such as grab bars in bathrooms and safety rails on stairs.  Keep rooms and walkways well-lit. Activity   Follow a regular exercise program to stay fit. This will help you maintain your balance. Ask your health care provider what types of exercise are appropriate for you.  If you need a cane or walker, use it as recommended by your health care provider.  Wear supportive shoes that have nonskid soles. Lifestyle  Do not drink alcohol if your health care provider tells you not to drink.  If you drink alcohol, limit how much you have: ? 0-1 drink a day for women. ? 0-2 drinks a day for men.  Be aware of how much alcohol is in your drink. In the [place] one drink equals one typical bottle of beer (12 oz), one-half glass of wine (5 oz), or one shot of hard liquor (1 oz).  Do not use any products that contain nicotine or tobacco, such as cigarettes and e-cigarettes. If you need help quitting, ask your health care provider. Summary  Having a healthy lifestyle and getting preventive care can help to protect your health and wellness after age 26.  Screening and testing are the best way to find a health problem early and help you avoid having a fall. Early diagnosis and treatment give you the best chance for managing medical conditions that are more common for people who are older than age 35.  Falls are a major cause of broken bones and head injuries in people who are older than age 76. Take precautions to prevent a fall at home.  Work with your health care provider to learn what changes you can make to improve your health and wellness and to prevent falls. This information is not intended to replace advice given to you by your health care provider. Make sure you discuss any questions you have with your health care provider. Document Released: 05/26/2017 Document Revised:  11/03/2018 Document Reviewed: 05/26/2017 Elsevier Patient Education  [place]. Pneumococcal Conjugate Vaccine (PCV13): What You Need to Know 1. Why get vaccinated? Pneumococcal conjugate vaccine (PCV13) can prevent pneumococcal disease. Pneumococcal disease refers to any illness caused by pneumococcal bacteria. These bacteria can cause many types of illnesses, including pneumonia, which is an infection of the lungs. Pneumococcal bacteria are one of the most common causes of pneumonia. Besides pneumonia, pneumococcal bacteria can also cause:  Ear infections  Sinus infections  Meningitis (infection of the tissue covering the brain and spinal cord)  Bacteremia (bloodstream infection) Anyone can get pneumococcal disease, but children under 73 years of age, people with certain medical conditions, adults 84 years or older, and cigarette smokers are at the highest risk. Most pneumococcal infections are mild. However, some can result in long-term problems, such as brain damage or hearing loss. Meningitis, bacteremia, and pneumonia caused by pneumococcal disease can be fatal. 2. PCV13 PCV13 protects against 13 types of bacteria that cause pneumococcal disease. Infants and young children usually need 4 doses of pneumococcal conjugate vaccine, at 2, 4, 6, and 64-7 months of age. In some cases, a child might need fewer than 4 doses  to complete PCV13 vaccination. A dose of PCV23 vaccine is also recommended for anyone 2 years or older with certain medical conditions if they did not already receive PCV13. This vaccine may be given to adults 70 years or older based on discussions between the patient and health care provider. 3. Talk with your health care provider Tell your vaccine provider if the person getting the vaccine:  Has had an allergic reaction after a previous dose of PCV13, to an earlier pneumococcal conjugate vaccine known as PCV7, or to any vaccine containing diphtheria toxoid (for  example, DTaP), or has any severe, life-threatening allergies.  In some cases, your health care provider may decide to postpone PCV13 vaccination to a future visit. People with minor illnesses, such as a cold, may be vaccinated. People who are moderately or severely ill should usually wait until they recover before getting PCV13. Your health care provider can give you more information. 4. Risks of a vaccine reaction  Redness, swelling, pain, or tenderness where the shot is given, and fever, loss of appetite, fussiness (irritability), feeling tired, headache, and chills can happen after PCV13. Young children may be at increased risk for seizures caused by fever after PCV13 if it is administered at the same time as inactivated influenza vaccine. Ask your health care provider for more information. People sometimes faint after medical procedures, including vaccination. Tell your provider if you feel dizzy or have vision changes or ringing in the ears. As with any medicine, there is a very remote chance of a vaccine causing a severe allergic reaction, other serious injury, or death. 5. What if there is a serious problem? An allergic reaction could occur after the vaccinated person leaves the clinic. If you see signs of a severe allergic reaction (hives, swelling of the face and throat, difficulty breathing, a fast heartbeat, dizziness, or weakness), call 9-1-1 and get the person to the nearest hospital. For other signs that concern you, call your health care provider. Adverse reactions should be reported to the Vaccine Adverse Event Reporting System (VAERS). Your health care provider will usually file this report, or you can do it yourself. Visit the VAERS website at www.vaers.SamedayNews.es or call 323-421-8983. VAERS is only for reporting reactions, and VAERS staff do not give medical advice. 6. The National Vaccine Injury Compensation Program The [employer] Vaccine Injury Compensation Program (VICP) is a  federal program that was created to compensate people who may have been injured by certain vaccines. Visit the VICP website at GoldCloset.com.ee or call 8020866437 to learn about the program and about filing a claim. There is a time limit to file a claim for compensation. 7. How can I learn more?  Ask your health care provider.  Call your local or state health department.  Contact the Centers for Disease Control and Prevention (CDC): ? Call (509)193-3384 (1-800-CDC-INFO) or ? Visit CDC's website at http://hunter.com/ Vaccine Information Statement PCV13 Vaccine (05/25/2018) This information is not intended to replace advice given to you by your health care provider. Make sure you discuss any questions you have with your health care provider. Document Released: 05/10/2006 Document Revised: 11/01/2018 Document Reviewed: 02/22/2018 Elsevier Patient Education  2020 [employer].

## 2019-06-16 NOTE — Assessment & Plan Note (Signed)
Congratulated patient on 6lb weight loss since June. Continue diet and exercise. Call with any concerns. Continue to monitor.

## 2019-06-16 NOTE — Assessment & Plan Note (Signed)
Will check quantitative levels and genotype and see if she needs treatment.

## 2019-06-16 NOTE — Assessment & Plan Note (Signed)
Checking labs today. Await results. Call with any concerns. Continue diet and exercise.

## 2019-06-16 NOTE — Assessment & Plan Note (Signed)
Checking labs today. Await results. Call with any concerns.  

## 2019-06-16 NOTE — Progress Notes (Signed)
BP 133/78   Pulse 72   Temp 99 F (37.2 C)   Ht 5' 5.35" (1.66 m)   Wt 246 lb (111.6 kg)   SpO2 99%   BMI 40.49 kg/m    Subjective:    Patient ID: Sandra Mendez, female    DOB: [birth year], 63 y.o.   MRN: [id number]  HPI: Sandra NACHTIGAL is a 63 y.o. female presenting on 06/16/2019 for comprehensive medical examination. Current medical complaints include:  Impaired Fasting Glucose HbA1C:  Lab Results  Component Value Date   HGBA1C 5.8 (H) 01/12/2019   Duration of elevated blood sugar: chronic Polydipsia: no Polyuria: no Weight change: no Visual disturbance: no Glucose Monitoring: no Diabetic Education: Completed Family history of diabetes: yes  HYPERLIPIDEMIA Hyperlipidemia status: stable Satisfied with current treatment?  yes Side effects:  Not on anything Past cholesterol meds: none Supplements: none Aspirin:  no The 10-year ASCVD risk score Mikey Bussing DC Jr., et al., 2013) is: 10.9%   Values used to calculate the score:     Age: 57 years     Sex: Female     Is Non-Hispanic African American: Yes     Diabetic: No     Tobacco smoker: No     Systolic Blood Pressure: 542 mmHg     Is BP treated: No     HDL Cholesterol: 70 mg/dL     Total Cholesterol: 274 mg/dL Chest pain:  no Coronary artery disease:  no  She currently lives with: husband Menopausal Symptoms: no  Functional Status Survey: Is the patient deaf or have difficulty hearing?: No Does the patient have difficulty seeing, even when wearing glasses/contacts?: No Does the patient have difficulty concentrating, remembering, or making decisions?: No Does the patient have difficulty walking or climbing stairs?: No Does the patient have difficulty dressing or bathing?: No Does the patient have difficulty doing errands alone such as visiting a doctor's office or shopping?: No  Fall Risk  06/16/2019 03/25/2018  Falls in the past year? 0 No  Number falls in past yr: 0 -  Injury with Fall? 0 -     Depression Screen Depression screen [facility] 2/9 06/16/2019 03/25/2018 03/19/2016  Decreased Interest 0 0 0  Down, Depressed, Hopeless 0 0 0  PHQ - 2 Score 0 0 0  Altered sleeping - 0 -  Tired, decreased energy - 0 -  Change in appetite - 0 -  Feeling bad or failure about yourself  - 0 -  Trouble concentrating - 0 -  Moving slowly or fidgety/restless - 0 -  Suicidal thoughts - 0 -  PHQ-9 Score - 0 -  Difficult doing work/chores - Not difficult at all -   Advanced Directives Does patient have a HCPOA?    no Does patient have a living will or MOST form?  no  Past Medical History:  Past Medical History:  Diagnosis Date  . History of colon polyps   . Obesity   . Vitamin D deficiency     Surgical History:  Past Surgical History:  Procedure Laterality Date  . [place] SURGERY  2013  . CHOLECYSTECTOMY    . COLONOSCOPY WITH PROPOFOL N/A 04/21/2017   Procedure: COLONOSCOPY WITH PROPOFOL;  Surgeon: Toledo, Benay Pike, MD;  Location: [facility] ENDOSCOPY;  Service: Endoscopy;  Laterality: N/A;  . WISDOM TOOTH EXTRACTION      Medications:  Current Outpatient Medications on File Prior to Visit  Medication Sig  . cyclobenzaprine (FLEXERIL) 10 MG tablet Take 1 tablet (  10 mg total) by mouth at bedtime.   No current facility-administered medications on file prior to visit.     Allergies:  Allergies  Allergen Reactions  . Erythromycin Rash  . Mushroom Extract Complex   . Other Itching and Rash  . Penicillins Rash    Social History:  Social History   Socioeconomic History  . Marital status: Married    Spouse name: Not on file  . Number of children: Not on file  . Years of education: Not on file  . Highest education level: Not on file  Occupational History  . Not on file  Social Needs  . Financial resource strain: Not on file  . Food insecurity    Worry: Not on file    Inability: Not on file  . Transportation needs    Medical: Not on file    Non-medical: Not on file  Tobacco  Use  . Smoking status: Never Smoker  . Smokeless tobacco: Never Used  Substance and Sexual Activity  . Alcohol use: Yes    Alcohol/week: 0.0 standard drinks    Comment: no alcohol in 24hrs, drinks beer occasional  . Drug use: No  . Sexual activity: Not Currently  Lifestyle  . Physical activity    Days per week: Not on file    Minutes per session: Not on file  . Stress: Not on file  Relationships  . Social Herbalist on phone: Not on file    Gets together: Not on file    Attends religious service: Not on file    Active member of club or organization: Not on file    Attends meetings of clubs or organizations: Not on file    Relationship status: Not on file  . Intimate partner violence    Fear of current or ex partner: Not on file    Emotionally abused: Not on file    Physically abused: Not on file    Forced sexual activity: Not on file  Other Topics Concern  . Not on file  Social History Narrative  . Not on file   Social History   Tobacco Use  Smoking Status Never Smoker  Smokeless Tobacco Never Used   Social History   Substance and Sexual Activity  Alcohol Use Yes  . Alcohol/week: 0.0 standard drinks   Comment: no alcohol in 24hrs, drinks beer occasional    Family History:  Family History  Problem Relation Age of Onset  . Diabetes Mother   . Stroke Father   . Colon cancer Sister   . Diabetes Sister   . Diabetes Sister   . Breast cancer Other 42    Past medical history, surgical history, medications, allergies, family history and social history reviewed with patient today and changes made to appropriate areas of the chart.   Review of Systems  Constitutional: Negative.   HENT: Negative.   Eyes: Negative.   Respiratory: Negative.   Cardiovascular: Negative.   Gastrointestinal: Negative.   Genitourinary: Negative.   Musculoskeletal: Positive for back pain. Negative for falls, joint pain, myalgias and neck pain.  Skin: Negative.    Neurological: Negative.   Endo/Heme/Allergies: Positive for environmental allergies. Negative for polydipsia. Does not bruise/bleed easily.  Psychiatric/Behavioral: Negative.    All other ROS negative except what is listed above and in the HPI.      Objective:    BP 133/78   Pulse 72   Temp 99 F (37.2 C)   Ht 5' 5.35" (1.66  m)   Wt 246 lb (111.6 kg)   SpO2 99%   BMI 40.49 kg/m   Wt Readings from Last 3 Encounters:  06/16/19 246 lb (111.6 kg)  01/12/19 250 lb (113.4 kg)  01/09/19 252 lb (114.3 kg)     Hearing Screening   _0  _1  _2  _3  _4  _5  _6  _7  _8   Right ear:   _9 Left ear:   _10 Visual Acuity Screening   Right eye Left eye Both eyes  Without correction: _11  With correction:      Physical Exam Vitals signs and nursing note reviewed.  Constitutional:      General: She is not in acute distress.    Appearance: Normal appearance. She is not ill-appearing, toxic-appearing or diaphoretic.  HENT:     Head: Normocephalic and atraumatic.     Right Ear: Tympanic membrane, ear canal and external ear normal. There is no impacted cerumen.     Left Ear: Tympanic membrane, ear canal and external ear normal. There is no impacted cerumen.     Nose: Nose normal. No congestion or rhinorrhea.     Mouth/Throat:     Mouth: Mucous membranes are moist.     Pharynx: Oropharynx is clear. No oropharyngeal exudate or posterior oropharyngeal erythema.  Eyes:     General: No scleral icterus.       Right eye: No discharge.        Left eye: No discharge.     Extraocular Movements: Extraocular movements intact.     Conjunctiva/sclera: Conjunctivae normal.     Pupils: Pupils are equal, round, and reactive to light.  Neck:     Musculoskeletal: Normal range of motion and neck supple. No neck rigidity or muscular tenderness.     Vascular: No carotid bruit.  Cardiovascular:     Rate and Rhythm: Normal rate and regular  rhythm.     Pulses: Normal pulses.     Heart sounds: No murmur. No friction rub. No gallop.   Pulmonary:     Effort: Pulmonary effort is normal. No respiratory distress.     Breath sounds: Normal breath sounds. No stridor. No wheezing, rhonchi or rales.  Chest:     Chest wall: No tenderness.  Abdominal:     General: Abdomen is flat. Bowel sounds are normal. There is no distension.     Palpations: Abdomen is soft. There is no mass.     Tenderness: There is no abdominal tenderness. There is no right CVA tenderness, left CVA tenderness, guarding or rebound.     Hernia: No hernia is present.  Genitourinary:    Comments: Breast and pelvic exams deferred with shared decision making Musculoskeletal:        General: No swelling, tenderness, deformity or signs of injury.     Right lower leg: No edema.     Left lower leg: No edema.  Lymphadenopathy:     Cervical: No cervical adenopathy.  Skin:    General: Skin is warm and dry.     Capillary Refill: Capillary refill takes less than 2 seconds.     Coloration: Skin is not jaundiced or pale.     Findings: No bruising, erythema, lesion or rash.  Neurological:     General: No focal deficit present.     Mental Status: She is alert and oriented to person, place, and time. Mental status is at baseline.  Cranial Nerves: No cranial nerve deficit.     Sensory: No sensory deficit.     Motor: No weakness.     Coordination: Coordination normal.     Gait: Gait normal.     Deep Tendon Reflexes: Reflexes normal.  Psychiatric:        Mood and Affect: Mood normal.        Behavior: Behavior normal.        Thought Content: Thought content normal.        Judgment: Judgment normal.     6CIT Screen 06/16/2019  What Year? 0 points  What month? 0 points  What time? 0 points  Count back from 20 0 points  Months in reverse 0 points  Repeat phrase 0 points  Total Score 0     Results for orders placed or performed in visit on 01/12/19  CBC with  Differential/Platelet  Result Value Ref Range   WBC 4.6 3.4 - 10.8 x10E3/uL   RBC 4.17 3.77 - 5.28 x10E6/uL   Hemoglobin 12.5 11.1 - 15.9 g/dL   Hematocrit 37.8 34.0 - 46.6 %   MCV 91 79 - 97 fL   MCH 30.0 26.6 - 33.0 pg   MCHC 33.1 31.5 - 35.7 g/dL   RDW 13.1 11.7 - 15.4 %   Platelets 200 150 - 450 x10E3/uL   Neutrophils 58 Not Estab. %   Lymphs 32 Not Estab. %   Monocytes 8 Not Estab. %   Eos 2 Not Estab. %   Basos 0 Not Estab. %   Neutrophils Absolute 2.6 1.4 - 7.0 x10E3/uL   Lymphocytes Absolute 1.4 0.7 - 3.1 x10E3/uL   Monocytes Absolute 0.4 0.1 - 0.9 x10E3/uL   EOS (ABSOLUTE) 0.1 0.0 - 0.4 x10E3/uL   Basophils Absolute 0.0 0.0 - 0.2 x10E3/uL   Immature Granulocytes 0 Not Estab. %   Immature Grans (Abs) 0.0 0.0 - 0.1 x10E3/uL  Comprehensive metabolic panel  Result Value Ref Range   Glucose 90 65 - 99 mg/dL   BUN 10 8 - 27 mg/dL   Creatinine, Ser 0.78 0.57 - 1.00 mg/dL   GFR calc non Af Amer 80 >59 mL/min/1.73   GFR calc Af Amer 92 >59 mL/min/1.73   BUN/Creatinine Ratio 13 12 - 28   Sodium 143 134 - 144 mmol/L   Potassium 3.8 3.5 - 5.2 mmol/L   Chloride 106 96 - 106 mmol/L   CO2 25 20 - 29 mmol/L   Calcium 9.2 8.7 - 10.3 mg/dL   Total Protein 6.8 6.0 - 8.5 g/dL   Albumin 4.3 3.8 - 4.8 g/dL   Globulin, Total 2.5 1.5 - 4.5 g/dL   Albumin/Globulin Ratio 1.7 1.2 - 2.2   Bilirubin Total 0.3 0.0 - 1.2 mg/dL   Alkaline Phosphatase 88 39 - 117 IU/L   AST 19 0 - 40 IU/L   ALT 12 0 - 32 IU/L  Lipid Panel With LDL/HDL Ratio  Result Value Ref Range   Cholesterol, Total 274 (H) 100 - 199 mg/dL   Triglycerides 97 0 - 149 mg/dL   HDL 70 >39 mg/dL   VLDL Cholesterol Cal 19 5 - 40 mg/dL   LDL Calculated 185 (H) 0 - 99 mg/dL   LDl/HDL Ratio 2.6 0.0 - 3.2 ratio  HCV RT-PCR, Quant (Graph)  Result Value Ref Range   Hepatitis C Quantitation HCV Not Detected IU/mL   Test Information Comment   Hepatitis C genotype  Result Value Ref Range   Hepatitis C Genotype  CANCELED     Please Note (HCV): Comment   Hgb A1c w/o eAG  Result Value Ref Range   Hgb A1c MFr Bld 5.8 (H) 4.8 - 5.6 %  TSH  Result Value Ref Range   TSH 3.890 0.450 - 4.500 uIU/mL  VITAMIN D 25 Hydroxy (Vit-D Deficiency, Fractures)  Result Value Ref Range   Vit D, 25-Hydroxy 23.5 (L) 30.0 - 100.0 ng/mL      Assessment & Plan:   Problem List Items Addressed This Visit      Endocrine   IFG (impaired fasting glucose)    Checking labs today. Await results. Call with any concerns. Continue diet and exercise.       Relevant Orders   Bayer DCA Hb A1c Waived   CBC with Differential OUT   Comp Met (CMET)   Microalbumin, Urine Waived   TSH     Other   Hyperlipidemia    Checking labs today. Await results. Call with any concerns. Continue diet and exercise.       Relevant Orders   CBC with Differential OUT   Comp Met (CMET)   Lipid Panel w/o Chol/HDL Ratio OUT   UA/M w/rflx Culture, Routine   Vitamin D deficiency    Checking labs today. Await results. Call with any concerns.       Relevant Orders   CBC with Differential OUT   Comp Met (CMET)   Vit D  25 hydroxy (rtn osteoporosis monitoring)   Morbid obesity ([place])    Congratulated patient on 6lb weight loss since June. Continue diet and exercise. Call with any concerns. Continue to monitor.       Relevant Orders   TSH   PTSD (post-traumatic stress disorder)    Doing well right now. No concerns. Call with any concerns.       Relevant Orders   CBC with Differential OUT   Comp Met (CMET)   Hepatitis C antibody test positive    Will check quantitative levels and genotype and see if she needs treatment.       Relevant Orders   Hepatitis C Genotype   HCV RNA quant   Advance care planning    A voluntary discussion about advance care planning including the explanation and discussion of advance directives was extensively discussed  with the patient for 5 minutes with patient.  Explanation about the health care proxy and Living will  was reviewed and packet with forms with explanation of how to fill them out was given.  During this discussion, the patient was able to identify a health care proxy as her sister and plans plan to fill out the paperwork required.  Patient was offered a separate [place] visit for further assistance with forms.          Other Visit Diagnoses    Welcome to Medicare preventive visit    -  Primary   Preventative care discussed today as below.    Relevant Orders   EKG (Completed)   Screening for osteoporosis       DEXA ordered today.   Relevant Orders   DG Bone Density       Preventative Services:  AAA screening: N/A Health Risk Assessment and Personalized Prevention Plan: Done today Bone Mass Measurements: Ordered today Breast Cancer Screening: Ordered today CVD Screening: Done today Cervical Cancer Screening: Up to date Colon Cancer Screening: Up to date Depression Screening: Done today Diabetes Screening: Done today Glaucoma Screening: See your eye doctor Hepatitis B  vaccine: N/A Hepatitis C screening: Done previously- checking for virus today HIV Screening: N/A Flu Vaccine: Declined Lung cancer Screening: N/A Obesity Screening: Done today Pneumonia Vaccines (2): Declined STI Screening: N/A  Follow up plan: Return in about 6 months (around 12/14/2019) for Follow up .   LABORATORY TESTING:  - Pap smear: up to date  IMMUNIZATIONS:   - Tdap: Tetanus vaccination status reviewed: last tetanus booster within 10 years. - Influenza: Refused - Pneumovax: Declined - Prevnar: Refused - Zostavax vaccine: Refused  SCREENING: -Mammogram: Ordered today  - Colonoscopy: Up to date  - Bone Density: Ordered today  -Hearing Test: Ordered today   PATIENT COUNSELING:   Advised to take 1 mg of folate supplement per day if capable of pregnancy.   Sexuality: Discussed sexually transmitted diseases, partner selection, use of condoms, avoidance of unintended pregnancy  and  contraceptive alternatives.   Advised to avoid cigarette smoking.  I discussed with the patient that most people either abstain from alcohol or drink within safe limits (<=14/week and <=4 drinks/occasion for males, <=7/weeks and <= 3 drinks/occasion for females) and that the risk for alcohol disorders and other health effects rises proportionally with the number of drinks per week and how often a drinker exceeds daily limits.  Discussed cessation/primary prevention of drug use and availability of treatment for abuse.   Diet: Encouraged to adjust caloric intake to maintain  or achieve ideal body weight, to reduce intake of dietary saturated fat and total fat, to limit sodium intake by avoiding high sodium foods and not adding table salt, and to maintain adequate dietary potassium and calcium preferably from fresh fruits, vegetables, and low-fat dairy products.    stressed the importance of regular exercise  Injury prevention: Discussed safety belts, safety helmets, smoke detector, smoking near bedding or upholstery.   Dental health: Discussed importance of regular tooth brushing, flossing, and dental visits.    NEXT PREVENTATIVE PHYSICAL DUE IN 1 YEAR. Return in about 6 months (around 12/14/2019) for Follow up .

## 2019-06-16 NOTE — Assessment & Plan Note (Signed)
Doing well right now. No concerns. Call with any concerns.

## 2019-06-16 NOTE — Assessment & Plan Note (Signed)
A voluntary discussion about advance care planning including the explanation and discussion of advance directives was extensively discussed  with the patient for 5 minutes with patient.  Explanation about the health care proxy and Living will was reviewed and packet with forms with explanation of how to fill them out was given.  During this discussion, the patient was able to identify a health care proxy as her sister and plans plan to fill out the paperwork required.  Patient was offered a separate [place] visit for further assistance with forms.

## 2019-06-19 ENCOUNTER — Other Ambulatory Visit: Payer: Self-pay | Admitting: Family Medicine

## 2019-06-19 MED ORDER — VITAMIN D (ERGOCALCIFEROL) 1.25 MG (50000 UNIT) PO CAPS: 50000.0000 [IU] | ORAL_CAPSULE | ORAL | 0 refills | Status: DC

## 2019-06-22 LAB — LIPID PANEL W/O CHOL/HDL RATIO
Cholesterol, Total: 264 mg/dL — ABNORMAL HIGH (ref 100–199)
HDL: 69 mg/dL (ref >39)
LDL Chol Calc (NIH): 179 mg/dL — ABNORMAL HIGH (ref 0–99)
Triglycerides: 95 mg/dL (ref 0–149)
VLDL Cholesterol Cal: 16 mg/dL (ref 5–40)

## 2019-06-22 LAB — CBC WITH DIFFERENTIAL/PLATELET
Basophils Absolute: 0 10*3/uL (ref 0.0–0.2)
Basos: 0 %
EOS (ABSOLUTE): 0.2 10*3/uL (ref 0.0–0.4)
Eos: 3 %
Hematocrit: 38.8 % (ref 34.0–46.6)
Hemoglobin: 12.9 g/dL (ref 11.1–15.9)
Immature Grans (Abs): 0 10*3/uL (ref 0.0–0.1)
Immature Granulocytes: 0 %
Lymphocytes Absolute: 1.3 10*3/uL (ref 0.7–3.1)
Lymphs: 26 %
MCH: 30.6 pg (ref 26.6–33.0)
MCHC: 33.2 g/dL (ref 31.5–35.7)
MCV: 92 fL (ref 79–97)
Monocytes Absolute: 0.4 10*3/uL (ref 0.1–0.9)
Monocytes: 8 %
Neutrophils Absolute: 3 10*3/uL (ref 1.4–7.0)
Neutrophils: 63 %
Platelets: 201 10*3/uL (ref 150–450)
RBC: 4.21 x10E6/uL (ref 3.77–5.28)
RDW: 13.5 % (ref 11.7–15.4)
WBC: 4.9 10*3/uL (ref 3.4–10.8)

## 2019-06-22 LAB — COMPREHENSIVE METABOLIC PANEL
ALT: 12 IU/L (ref 0–32)
AST: 17 IU/L (ref 0–40)
Albumin/Globulin Ratio: 1.3 (ref 1.2–2.2)
Albumin: 3.9 g/dL (ref 3.8–4.8)
Alkaline Phosphatase: 99 IU/L (ref 39–117)
BUN/Creatinine Ratio: 19 (ref 12–28)
BUN: 15 mg/dL (ref 8–27)
Bilirubin Total: 0.3 mg/dL (ref 0.0–1.2)
CO2: 25 mmol/L (ref 20–29)
Calcium: 8.8 mg/dL (ref 8.7–10.3)
Chloride: 104 mmol/L (ref 96–106)
Creatinine, Ser: 0.78 mg/dL (ref 0.57–1.00)
GFR calc Af Amer: 92 mL/min/{1.73_m2} (ref >59)
GFR calc non Af Amer: 80 mL/min/{1.73_m2} (ref >59)
Globulin, Total: 2.9 g/dL (ref 1.5–4.5)
Glucose: 92 mg/dL (ref 65–99)
Potassium: 4 mmol/L (ref 3.5–5.2)
Sodium: 142 mmol/L (ref 134–144)
Total Protein: 6.8 g/dL (ref 6.0–8.5)

## 2019-06-22 LAB — TSH: TSH: 2.69 u[IU]/mL (ref 0.450–4.500)

## 2019-06-22 LAB — HEPATITIS C GENOTYPE

## 2019-06-22 LAB — VITAMIN D 25 HYDROXY (VIT D DEFICIENCY, FRACTURES): Vit D, 25-Hydroxy: 16.5 ng/mL — ABNORMAL LOW (ref 30.0–100.0)

## 2019-07-03 ENCOUNTER — Ambulatory Visit
Admission: RE | Admit: 2019-07-03 | Discharge: 2019-07-03 | Disposition: A | Discharge status: Home / Self Care | Payer: Medicare HMO | Source: Ambulatory Visit | Attending: Family Medicine | Admitting: Family Medicine

## 2019-07-03 ENCOUNTER — Other Ambulatory Visit: Payer: Medicare HMO

## 2019-07-03 ENCOUNTER — Other Ambulatory Visit: Payer: Self-pay

## 2019-07-03 DIAGNOSIS — Z780 Asymptomatic menopausal state: Secondary | ICD-10-CM | POA: Diagnosis not present

## 2019-07-03 DIAGNOSIS — Z13820 Encounter for screening for osteoporosis: Secondary | ICD-10-CM | POA: Insufficient documentation

## 2019-07-03 DIAGNOSIS — R768 Other specified abnormal immunological findings in serum: Secondary | ICD-10-CM

## 2019-07-04 ENCOUNTER — Encounter: Payer: Self-pay | Admitting: Family Medicine

## 2019-07-05 LAB — HCV RNA QUANT: Hepatitis C Quantitation: NOT DETECTED IU/mL

## 2019-07-05 LAB — HEPATITIS C GENOTYPE

## 2019-08-08 ENCOUNTER — Encounter: Payer: Self-pay | Admitting: Family Medicine

## 2019-08-10 ENCOUNTER — Ambulatory Visit: Payer: Medicare HMO | Admitting: Family Medicine

## 2019-08-11 ENCOUNTER — Ambulatory Visit (INDEPENDENT_AMBULATORY_CARE_PROVIDER_SITE_OTHER): Payer: Medicare HMO | Admitting: Family Medicine

## 2019-08-11 ENCOUNTER — Other Ambulatory Visit: Payer: Self-pay

## 2019-08-11 ENCOUNTER — Encounter: Payer: Self-pay | Admitting: Family Medicine

## 2019-08-11 VITALS — BP 141/84 | HR 73 | Temp 99.3°F

## 2019-08-11 DIAGNOSIS — B029 Zoster without complications: Secondary | ICD-10-CM

## 2019-08-11 MED ORDER — TRIAMCINOLONE ACETONIDE 0.5 % EX OINT: 1.0000 "application " | TOPICAL_OINTMENT | Freq: Two times a day (BID) | CUTANEOUS | 0 refills | Status: DC

## 2019-08-11 NOTE — Progress Notes (Signed)
BP (!) 141/84   Pulse 73   Temp 99.3 F (37.4 C) (Oral)   SpO2 97%    Subjective:    Patient ID: Sandra Mendez, female    DOB: 1956/02/03, 64 y.o.   MRN: [id number]  HPI: Sandra Mendez is a 64 y.o. female  Chief Complaint  Patient presents with  . Rash    pt states she has a rash on her right hip that has been there for the last 3 weeks to a month per the patient. States it was itching at first but has not for the last few days   RASH Duration:  3 weeks  Location: R side of her belly  Itching: yes Burning: yes Redness: yes Oozing: no Scaling: yes Blisters: yes Painful: yes Fevers: no Change in detergents/soaps/personal care products: no Recent illness: no Recent travel:no History of same: no Context: better Alleviating factors: lotion/moisturizer Treatments attempted:lotion/moisturizer Shortness of breath: no  Throat/tongue swelling: no Myalgias/arthralgias: no  Relevant past medical, surgical, family and social history reviewed and updated as indicated. Interim medical history since our last visit reviewed. Allergies and medications reviewed and updated.  Review of Systems  Constitutional: Negative.   Respiratory: Negative.   Cardiovascular: Negative.   Gastrointestinal: Negative.   Musculoskeletal: Negative.   Skin: Positive for rash. Negative for color change, pallor and wound.  Psychiatric/Behavioral: Negative.     Per HPI unless specifically indicated above     Objective:    BP (!) 141/84   Pulse 73   Temp 99.3 F (37.4 C) (Oral)   SpO2 97%   Wt Readings from Last 3 Encounters:  06/16/19 246 lb (111.6 kg)  01/12/19 250 lb (113.4 kg)  01/09/19 252 lb (114.3 kg)    Physical Exam Vitals and nursing note reviewed.  Constitutional:      General: She is not in acute distress.    Appearance: Normal appearance. She is not ill-appearing, toxic-appearing or diaphoretic.  HENT:     Head: Normocephalic and atraumatic.     Right Ear: External  ear normal.     Left Ear: External ear normal.     Nose: Nose normal.     Mouth/Throat:     Mouth: Mucous membranes are moist.     Pharynx: Oropharynx is clear.  Eyes:     General: No scleral icterus.       Right eye: No discharge.        Left eye: No discharge.     Extraocular Movements: Extraocular movements intact.     Conjunctiva/sclera: Conjunctivae normal.     Pupils: Pupils are equal, round, and reactive to light.  Cardiovascular:     Rate and Rhythm: Normal rate and regular rhythm.     Pulses: Normal pulses.     Heart sounds: Normal heart sounds. No murmur. No friction rub. No gallop.   Pulmonary:     Effort: Pulmonary effort is normal. No respiratory distress.     Breath sounds: Normal breath sounds. No stridor. No wheezing, rhonchi or rales.  Chest:     Chest wall: No tenderness.  Musculoskeletal:        General: Normal range of motion.     Cervical back: Normal range of motion and neck supple.  Skin:    General: Skin is warm and dry.     Capillary Refill: Capillary refill takes less than 2 seconds.     Coloration: Skin is not jaundiced or pale.     Findings: No  bruising, erythema, lesion or rash.     Comments: Dried vesicles on R side in dermatomal pattern  Neurological:     General: No focal deficit present.     Mental Status: She is alert and oriented to person, place, and time. Mental status is at baseline.  Psychiatric:        Mood and Affect: Mood normal.        Behavior: Behavior normal.        Thought Content: Thought content normal.        Judgment: Judgment normal.     Results for orders placed or performed in visit on 07/03/19  Hepatitis C Genotype  Result Value Ref Range   Hepatitis C Genotype CANCELED    Please Note (HCV): Comment   HCV RNA quant  Result Value Ref Range   Hepatitis C Quantitation HCV Not Detected IU/mL   Test Information Comment       Assessment & Plan:   Problem List Items Addressed This Visit    None    Visit  Diagnoses    Herpes zoster without complication    -  Primary   Now resolved. Triamcinalone for itch. Call with any concerns.        Follow up plan: Return as scheduled.

## 2019-09-09 ENCOUNTER — Other Ambulatory Visit: Payer: Self-pay | Admitting: Family Medicine

## 2019-09-13 ENCOUNTER — Ambulatory Visit
Admission: RE | Admit: 2019-09-13 | Discharge: 2019-09-13 | Disposition: A | Discharge status: Home / Self Care | Payer: Medicare HMO | Source: Ambulatory Visit | Attending: Family Medicine | Admitting: Family Medicine

## 2019-09-13 DIAGNOSIS — Z1231 Encounter for screening mammogram for malignant neoplasm of breast: Secondary | ICD-10-CM | POA: Diagnosis present

## 2019-09-14 MED ORDER — VITAMIN D (ERGOCALCIFEROL) 1.25 MG (50000 UNIT) PO CAPS: 50000.0000 [IU] | ORAL_CAPSULE | ORAL | 0 refills | Status: DC

## 2019-12-08 ENCOUNTER — Other Ambulatory Visit: Payer: Self-pay | Admitting: Family Medicine

## 2019-12-09 ENCOUNTER — Other Ambulatory Visit: Payer: Self-pay | Admitting: Family Medicine

## 2019-12-11 MED ORDER — CYCLOBENZAPRINE HCL 10 MG PO TABS: 10.0000 mg | ORAL_TABLET | Freq: Every day | ORAL | 0 refills | Status: DC

## 2019-12-11 NOTE — Telephone Encounter (Signed)
Routing to provider  

## 2019-12-15 ENCOUNTER — Ambulatory Visit: Payer: Medicare HMO | Admitting: Family Medicine

## 2020-01-15 ENCOUNTER — Other Ambulatory Visit: Payer: Self-pay | Admitting: Family Medicine

## 2020-01-15 NOTE — Telephone Encounter (Signed)
Called pt r/s'd 7/8 appt to 6/25 pt states that she takes tablet once a week and will be fine until friday

## 2020-01-15 NOTE — Telephone Encounter (Signed)
Needs recheck

## 2020-01-15 NOTE — Telephone Encounter (Signed)
Requested medication (s) are due for refill today: yes  Requested medication (s) are on the active medication list: yes  Last refill:  09/14/19  Future visit scheduled: yes  Notes to clinic:  not delegated    Requested Prescriptions  Pending Prescriptions Disp Refills   Vitamin D, Ergocalciferol, (DRISDOL) 1.25 MG (50000 UNIT) CAPS capsule [Pharmacy Med Name: Vitamin D (Ergocalciferol) 1.25 MG (50000 UT) Oral Capsule] 12 capsule 0    Sig: Take 1 capsule by mouth once a week      Endocrinology:  Vitamins - Vitamin D Supplementation Failed - 01/15/2020 11:49 AM      Failed - 50,000 IU strengths are not delegated      Failed - Phosphate in normal range and within 360 days    Phosphorus  Date Value Ref Range Status  01/15/2017 3.2 2.5 - 4.5 mg/dL Final  12/29/2011 2.4 (L) 2.5 - 4.9 mg/dL Final          Failed - Vitamin D in normal range and within 360 days    Vit D, 25-Hydroxy  Date Value Ref Range Status  06/16/2019 16.5 (L) 30.0 - 100.0 ng/mL Final    Comment:    Vitamin D deficiency has been defined by the Institute of Medicine and an Endocrine Society practice guideline as a level of serum 25-OH vitamin D less than 20 ng/mL (1,2). The Endocrine Society went on to further define vitamin D insufficiency as a level between 21 and 29 ng/mL (2). 1. IOM (Institute of Medicine). 2010. Dietary reference    intakes for calcium and D. [place]: The    [employer]. 2. Holick MF, Binkley Hitchcock, Bischoff-Ferrari HA, et al.    Evaluation, treatment, and prevention of vitamin D    deficiency: an Endocrine Society clinical practice    guideline. JCEM. 2011 Jul; 96(7):1911-30.           Passed - Ca in normal range and within 360 days    Calcium  Date Value Ref Range Status  06/16/2019 8.8 8.7 - 10.3 mg/dL Final   Calcium, Total  Date Value Ref Range Status  12/29/2011 7.7 (L) 8.5 - 10.1 mg/dL Final          Passed - Valid encounter within last 12 months     Recent Outpatient Visits           5 months ago Herpes zoster without complication   [place], Megan P, DO   7 months ago [facility] [employer] preventive visit   [facility], Megan P, DO   1 year ago IFG (impaired fasting glucose)   [facility] [place], [place], DO   1 year ago Upper respiratory tract infection, unspecified type   [place], Megan P, DO   1 year ago Morbid obesity [facility])   [place], Barb Merino, DO       Future Appointments             In 2 weeks Wynetta Emery, Barb Merino, DO [employer], [facility]

## 2020-01-15 NOTE — Telephone Encounter (Signed)
Routing to provider  

## 2020-01-19 ENCOUNTER — Ambulatory Visit (INDEPENDENT_AMBULATORY_CARE_PROVIDER_SITE_OTHER): Payer: Medicare HMO | Admitting: Family Medicine

## 2020-01-19 ENCOUNTER — Other Ambulatory Visit: Payer: Self-pay

## 2020-01-19 ENCOUNTER — Encounter: Payer: Self-pay | Admitting: Family Medicine

## 2020-01-19 VITALS — BP 130/77 | HR 64 | Temp 98.7°F | Ht 64.57 in | Wt 237.0 lb

## 2020-01-19 DIAGNOSIS — E559 Vitamin D deficiency, unspecified: Secondary | ICD-10-CM

## 2020-01-19 DIAGNOSIS — R7301 Impaired fasting glucose: Secondary | ICD-10-CM | POA: Diagnosis not present

## 2020-01-19 DIAGNOSIS — E782 Mixed hyperlipidemia: Secondary | ICD-10-CM | POA: Diagnosis not present

## 2020-01-19 LAB — BAYER DCA HB A1C WAIVED: HB A1C (BAYER DCA - WAIVED): 5.6 % (ref <7.0)

## 2020-01-19 NOTE — Progress Notes (Signed)
BP 130/77 (BP Location: Left Arm, Patient Position: Sitting, Cuff Size: Normal)   Pulse 64   Temp 98.7 F (37.1 C) (Oral)   Ht 5' 4.57" (1.64 m)   Wt 237 lb (107.5 kg)   SpO2 99%   BMI 39.97 kg/m    Subjective:    Patient ID: Sandra Mendez, female    DOB: 07-22-1956, 64 y.o.   MRN: [id number]  HPI: Sandra Mendez is a 64 y.o. female  Chief Complaint  Patient presents with  . Hyperlipidemia  . IFG   Impaired Fasting Glucose HbA1C:  Lab Results  Component Value Date   HGBA1C 6.0 06/16/2019   Duration of elevated blood sugar: chronic Polydipsia: no Polyuria: no Weight change: yes Visual disturbance: no Glucose Monitoring: no    Accucheck frequency: Not Checking Diabetic Education: Not Completed Family history of diabetes: yes  HYPERLIPIDEMIA Hyperlipidemia status: stable Satisfied with current treatment?  yes Side effects:  Not on anything Past cholesterol meds: none Supplements: none Aspirin:  no The 10-year ASCVD risk score Mikey Bussing DC Jr., et al., 2013) is: 11%   Values used to calculate the score:     Age: 89 years     Sex: Female     Is Non-Hispanic African American: Yes     Diabetic: No     Tobacco smoker: No     Systolic Blood Pressure: 623 mmHg     Is BP treated: No     HDL Cholesterol: 69 mg/dL     Total Cholesterol: 264 mg/dL Chest pain:  no Coronary artery disease:  no  Relevant past medical, surgical, family and social history reviewed and updated as indicated. Interim medical history since our last visit reviewed. Allergies and medications reviewed and updated.  Review of Systems  Constitutional: Negative.   Respiratory: Negative.   Cardiovascular: Negative.   Musculoskeletal: Negative.   Skin: Negative.   Psychiatric/Behavioral: Negative.     Per HPI unless specifically indicated above     Objective:    BP 130/77 (BP Location: Left Arm, Patient Position: Sitting, Cuff Size: Normal)   Pulse 64   Temp 98.7 F (37.1 C) (Oral)    Ht 5' 4.57" (1.64 m)   Wt 237 lb (107.5 kg)   SpO2 99%   BMI 39.97 kg/m   Wt Readings from Last 3 Encounters:  01/19/20 237 lb (107.5 kg)  06/16/19 246 lb (111.6 kg)  01/12/19 250 lb (113.4 kg)    Physical Exam Vitals and nursing note reviewed.  Constitutional:      General: She is not in acute distress.    Appearance: Normal appearance. She is not ill-appearing, toxic-appearing or diaphoretic.  HENT:     Head: Normocephalic and atraumatic.     Right Ear: External ear normal.     Left Ear: External ear normal.     Nose: Nose normal.     Mouth/Throat:     Mouth: Mucous membranes are moist.     Pharynx: Oropharynx is clear.  Eyes:     General: No scleral icterus.       Right eye: No discharge.        Left eye: No discharge.     Extraocular Movements: Extraocular movements intact.     Conjunctiva/sclera: Conjunctivae normal.     Pupils: Pupils are equal, round, and reactive to light.  Cardiovascular:     Rate and Rhythm: Normal rate and regular rhythm.     Pulses: Normal pulses.  Heart sounds: Normal heart sounds. No murmur heard.  No friction rub. No gallop.   Pulmonary:     Effort: Pulmonary effort is normal. No respiratory distress.     Breath sounds: Normal breath sounds. No stridor. No wheezing, rhonchi or rales.  Chest:     Chest wall: No tenderness.  Musculoskeletal:        General: Normal range of motion.     Cervical back: Normal range of motion and neck supple.  Skin:    General: Skin is warm and dry.     Capillary Refill: Capillary refill takes less than 2 seconds.     Coloration: Skin is not jaundiced or pale.     Findings: No bruising, erythema, lesion or rash.  Neurological:     General: No focal deficit present.     Mental Status: She is alert and oriented to person, place, and time. Mental status is at baseline.  Psychiatric:        Mood and Affect: Mood normal.        Behavior: Behavior normal.        Thought Content: Thought content normal.         Judgment: Judgment normal.     Results for orders placed or performed in visit on 07/03/19  Hepatitis C Genotype  Result Value Ref Range   Hepatitis C Genotype CANCELED    Please Note (HCV): Comment   HCV RNA quant  Result Value Ref Range   Hepatitis C Quantitation HCV Not Detected IU/mL   Test Information Comment       Assessment & Plan:   Problem List Items Addressed This Visit      Endocrine   IFG (impaired fasting glucose) - Primary    Rechecking levels today. Await results. Treat as needed.       Relevant Orders   Bayer DCA Hb A1c Waived   Comprehensive metabolic panel     Other   Hyperlipidemia    Rechecking levels today. Await results. Treat as needed.       Relevant Orders   Comprehensive metabolic panel   Lipid Panel w/o Chol/HDL Ratio   Vitamin D deficiency    Rechecking levels today. Await results. Treat as needed.       Relevant Orders   Comprehensive metabolic panel   VITAMIN D 25 Hydroxy (Vit-D Deficiency, Fractures)   Morbid obesity (HCC)    Due to IFG and Hyperlipidemia. Congratulated patient on 9lb weight loss. Continue diet and exercise. Call with any concerns. Continue to monitor.           Follow up plan: Return in about 6 months (around 07/20/2020), or Physical/wellness.

## 2020-01-19 NOTE — Assessment & Plan Note (Signed)
Rechecking levels today. Await results. Treat as needed.  

## 2020-01-19 NOTE — Assessment & Plan Note (Signed)
Due to IFG and Hyperlipidemia. Congratulated patient on 9lb weight loss. Continue diet and exercise. Call with any concerns. Continue to monitor.

## 2020-01-20 LAB — COMPREHENSIVE METABOLIC PANEL
ALT: 16 IU/L (ref 0–32)
AST: 24 IU/L (ref 0–40)
Albumin/Globulin Ratio: 1.9 (ref 1.2–2.2)
Albumin: 4.3 g/dL (ref 3.8–4.8)
Alkaline Phosphatase: 101 IU/L (ref 48–121)
BUN/Creatinine Ratio: 15 (ref 12–28)
BUN: 13 mg/dL (ref 8–27)
Bilirubin Total: <0.2 mg/dL (ref 0.0–1.2)
CO2: 25 mmol/L (ref 20–29)
Calcium: 8.8 mg/dL (ref 8.7–10.3)
Chloride: 109 mmol/L — ABNORMAL HIGH (ref 96–106)
Creatinine, Ser: 0.88 mg/dL (ref 0.57–1.00)
GFR calc Af Amer: 79 mL/min/{1.73_m2} (ref >59)
GFR calc non Af Amer: 69 mL/min/{1.73_m2} (ref >59)
Globulin, Total: 2.3 g/dL (ref 1.5–4.5)
Glucose: 61 mg/dL — ABNORMAL LOW (ref 65–99)
Potassium: 4.2 mmol/L (ref 3.5–5.2)
Sodium: 144 mmol/L (ref 134–144)
Total Protein: 6.6 g/dL (ref 6.0–8.5)

## 2020-01-20 LAB — LIPID PANEL W/O CHOL/HDL RATIO
Cholesterol, Total: 251 mg/dL — ABNORMAL HIGH (ref 100–199)
HDL: 64 mg/dL (ref >39)
LDL Chol Calc (NIH): 164 mg/dL — ABNORMAL HIGH (ref 0–99)
Triglycerides: 131 mg/dL (ref 0–149)
VLDL Cholesterol Cal: 23 mg/dL (ref 5–40)

## 2020-01-20 LAB — VITAMIN D 25 HYDROXY (VIT D DEFICIENCY, FRACTURES): Vit D, 25-Hydroxy: 22.8 ng/mL — ABNORMAL LOW (ref 30.0–100.0)

## 2020-02-01 ENCOUNTER — Ambulatory Visit: Payer: Medicare HMO | Admitting: Family Medicine

## 2020-02-03 ENCOUNTER — Other Ambulatory Visit: Payer: Self-pay | Admitting: Family Medicine

## 2020-02-05 ENCOUNTER — Other Ambulatory Visit: Payer: Self-pay | Admitting: Family Medicine

## 2020-02-05 MED ORDER — VITAMIN D (ERGOCALCIFEROL) 1.25 MG (50000 UNIT) PO CAPS: 50000.0000 [IU] | ORAL_CAPSULE | ORAL | 0 refills | Status: DC

## 2020-02-05 NOTE — Telephone Encounter (Signed)
Vitamin D is the only one due for refill. Last seen 01/19/20

## 2020-04-14 ENCOUNTER — Other Ambulatory Visit: Payer: Self-pay | Admitting: Family Medicine

## 2020-04-15 NOTE — Telephone Encounter (Signed)
Routing to provider  

## 2020-04-16 MED ORDER — VITAMIN D (ERGOCALCIFEROL) 1.25 MG (50000 UNIT) PO CAPS: 50000.0000 [IU] | ORAL_CAPSULE | ORAL | 0 refills | Status: DC

## 2020-05-22 ENCOUNTER — Encounter: Payer: Self-pay | Admitting: Family Medicine

## 2020-06-12 ENCOUNTER — Other Ambulatory Visit: Payer: Self-pay

## 2020-06-12 ENCOUNTER — Ambulatory Visit (INDEPENDENT_AMBULATORY_CARE_PROVIDER_SITE_OTHER): Payer: Medicare HMO | Admitting: Family Medicine

## 2020-06-12 ENCOUNTER — Encounter: Payer: Self-pay | Admitting: Family Medicine

## 2020-06-12 VITALS — BP 134/82 | HR 63 | Temp 98.6°F | Ht 64.17 in | Wt 244.2 lb

## 2020-06-12 DIAGNOSIS — E782 Mixed hyperlipidemia: Secondary | ICD-10-CM | POA: Diagnosis not present

## 2020-06-12 DIAGNOSIS — E559 Vitamin D deficiency, unspecified: Secondary | ICD-10-CM

## 2020-06-12 DIAGNOSIS — R7301 Impaired fasting glucose: Secondary | ICD-10-CM | POA: Diagnosis not present

## 2020-06-12 DIAGNOSIS — Z23 Encounter for immunization: Secondary | ICD-10-CM | POA: Diagnosis not present

## 2020-06-12 DIAGNOSIS — R252 Cramp and spasm: Secondary | ICD-10-CM | POA: Diagnosis not present

## 2020-06-12 LAB — URINALYSIS, ROUTINE W REFLEX MICROSCOPIC
Bilirubin, UA: NEGATIVE
Glucose, UA: NEGATIVE
Ketones, UA: NEGATIVE
Leukocytes,UA: NEGATIVE
Nitrite, UA: NEGATIVE
Protein,UA: NEGATIVE
RBC, UA: NEGATIVE
Specific Gravity, UA: 1.025 (ref 1.005–1.030)
Urobilinogen, Ur: 0.2 mg/dL (ref 0.2–1.0)
pH, UA: 5 (ref 5.0–7.5)

## 2020-06-12 LAB — BAYER DCA HB A1C WAIVED: HB A1C (BAYER DCA - WAIVED): 5.9 % (ref <7.0)

## 2020-06-12 MED ORDER — CYCLOBENZAPRINE HCL 10 MG PO TABS: 10.0000 mg | ORAL_TABLET | Freq: Every day | ORAL | 0 refills | Status: DC

## 2020-06-12 MED ORDER — VITAMIN D (ERGOCALCIFEROL) 1.25 MG (50000 UNIT) PO CAPS: 50000.0000 [IU] | ORAL_CAPSULE | ORAL | 1 refills | Status: DC

## 2020-06-12 NOTE — Patient Instructions (Signed)
Leg Cramps Leg cramps occur when one or more muscles tighten and you have no control over this tightening (involuntary muscle contraction). Muscle cramps can develop in any muscle, but the most common place is in the calf muscles of the leg. Those cramps can occur during exercise or when you are at rest. Leg cramps are painful, and they may last for a few seconds to a few minutes. Cramps may return several times before they finally stop. Usually, leg cramps are not caused by a serious medical problem. In many cases, the cause is not known. Some common causes include:  Excessive physical effort (overexertion), such as during intense exercise.  Overuse from repetitive motions, or doing the same thing over and over.  Staying in a certain position for a long period of time.  Improper preparation, form, or technique while performing a sport or an activity.  Dehydration.  Injury.  Side effects of certain medicines.  Abnormally low levels of minerals in your blood (electrolytes), especially potassium and calcium. This could result from: ? Pregnancy. ? Taking diuretic medicines. Follow these instructions at home: Eating and drinking  Drink enough fluid to keep your urine pale yellow. Staying hydrated may help prevent cramps.  Eat a healthy diet that includes plenty of nutrients to help your muscles function. A healthy diet includes fruits and vegetables, lean protein, whole grains, and low-fat or nonfat dairy products. Managing pain, stiffness, and swelling      Try massaging, stretching, and relaxing the affected muscle. Do this for several minutes at a time.  If directed, put ice on areas that are sore or painful after a cramp: ? Put ice in a plastic bag. ? Place a towel between your skin and the bag. ? Leave the ice on for 20 minutes, 2-3 times a day.  If directed, apply heat to muscles that are tense or tight. Do this before you exercise, or as often as told by your health care  provider. Use the heat source that your health care provider recommends, such as a moist heat pack or a heating pad. ? Place a towel between your skin and the heat source. ? Leave the heat on for 20-30 minutes. ? Remove the heat if your skin turns bright red. This is especially important if you are unable to feel pain, heat, or cold. You may have a greater risk of getting burned.  Try taking hot showers or baths to help relax tight muscles. General instructions  If you are having frequent leg cramps, avoid intense exercise for several days.  Take over-the-counter and prescription medicines only as told by your health care provider.  Keep all follow-up visits as told by your health care provider. This is important. Contact a health care provider if:  Your leg cramps get more severe or more frequent, or they do not improve over time.  Your foot becomes cold, numb, or blue. Summary  Muscle cramps can develop in any muscle, but the most common place is in the calf muscles of the leg.  Leg cramps are painful, and they may last for a few seconds to a few minutes.  Usually, leg cramps are not caused by a serious medical problem. Often, the cause is not known.  Stay hydrated and take over-the-counter and prescription medicines only as told by your health care provider. This information is not intended to replace advice given to you by your health care provider. Make sure you discuss any questions you have with your health care  provider. Document Revised: 06/25/2017 Document Reviewed: 04/22/2017 Elsevier Patient Education  2020 [employer].

## 2020-06-12 NOTE — Progress Notes (Signed)
BP 134/82   Pulse 63   Temp 98.6 F (37 C) (Oral)   Ht 5' 4.17" (1.63 m)   Wt 244 lb 3.2 oz (110.8 kg)   SpO2 99%   BMI 41.69 kg/m    Subjective:    Patient ID: Sandra Mendez, female    DOB: 30-Jul-1955, 64 y.o.   MRN: [id number]  HPI: Sandra Mendez is a 64 y.o. female  Chief Complaint  Patient presents with  . Stress    Neice passed away unexpectedly  . Medication Refill    Flexeril, and Vit D.   ANXIETY/STRESS- her husband has dementia and her niece passed very suddenly.  Duration: chronic, worse in the last couple of weeks Status:exacerbated Anxious mood: yes  Excessive worrying: yes Irritability: no  Sweating: no Nausea: no Palpitations:no Hyperventilation: no Panic attacks: no Agoraphobia: no  Obscessions/compulsions: no Depressed mood: no Depression screen [facility] [place] 2/9 06/16/2019 03/25/2018 03/19/2016  Decreased Interest 0 0 0  Down, Depressed, Hopeless 0 0 0  PHQ - 2 Score 0 0 0  Altered sleeping - 0 -  Tired, decreased energy - 0 -  Change in appetite - 0 -  Feeling bad or failure about yourself  - 0 -  Trouble concentrating - 0 -  Moving slowly or fidgety/restless - 0 -  Suicidal thoughts - 0 -  PHQ-9 Score - 0 -  Difficult doing work/chores - Not difficult at all -   Anhedonia: no Weight changes: no Insomnia: no   Hypersomnia: no Fatigue/loss of energy: yes Feelings of worthlessness: no Feelings of guilt: no Impaired concentration/indecisiveness: no Suicidal ideations: no  Crying spells: yes Recent Stressors/Life Changes: yes   Relationship problems: no   Family stress: no     Financial stress: no    Job stress: no    Recent death/loss: yes  Impaired Fasting Glucose HbA1C:  Lab Results  Component Value Date   HGBA1C 5.9 06/12/2020   Duration of elevated blood sugar: chronic Polydipsia: no Polyuria: no Weight change: no Visual disturbance: no Glucose Monitoring: no Diabetic Education: Not Completed Family history of diabetes:  yes  HYPERLIPIDEMIA Hyperlipidemia status: stable Satisfied with current treatment?  yes Side effects:  no Medication compliance: not on anything Past cholesterol meds: none Supplements: none Aspirin:  no The 10-year ASCVD risk score Mikey Bussing DC Jr., et al., 2013) is: 13.2%   Values used to calculate the score:     Age: 64 years     Sex: Female     Is Non-Hispanic African American: Yes     Diabetic: No     Tobacco smoker: No     Systolic Blood Pressure: 440 mmHg     Is BP treated: No     HDL Cholesterol: 72 mg/dL     Total Cholesterol: 303 mg/dL Chest pain:  no Coronary artery disease:  no  Relevant past medical, surgical, family and social history reviewed and updated as indicated. Interim medical history since our last visit reviewed. Allergies and medications reviewed and updated.  Review of Systems  Constitutional: Negative.   Respiratory: Negative.   Cardiovascular: Negative.   Gastrointestinal: Negative.   Musculoskeletal: Positive for myalgias. Negative for arthralgias, back pain, gait problem, joint swelling, neck pain and neck stiffness.  Skin: Negative.   Neurological: Negative.   Psychiatric/Behavioral: Negative.     Per HPI unless specifically indicated above     Objective:    BP 134/82   Pulse 63   Temp 98.6 F (37  C) (Oral)   Ht 5' 4.17" (1.63 m)   Wt 244 lb 3.2 oz (110.8 kg)   SpO2 99%   BMI 41.69 kg/m   Wt Readings from Last 3 Encounters:  06/12/20 244 lb 3.2 oz (110.8 kg)  01/19/20 237 lb (107.5 kg)  06/16/19 246 lb (111.6 kg)    Physical Exam Vitals and nursing note reviewed.  Constitutional:      General: She is not in acute distress.    Appearance: Normal appearance. She is not ill-appearing, toxic-appearing or diaphoretic.  HENT:     Head: Normocephalic and atraumatic.     Right Ear: External ear normal.     Left Ear: External ear normal.     Nose: Nose normal.     Mouth/Throat:     Mouth: Mucous membranes are moist.     Pharynx:  Oropharynx is clear.  Eyes:     General: No scleral icterus.       Right eye: No discharge.        Left eye: No discharge.     Extraocular Movements: Extraocular movements intact.     Conjunctiva/sclera: Conjunctivae normal.     Pupils: Pupils are equal, round, and reactive to light.  Cardiovascular:     Rate and Rhythm: Normal rate and regular rhythm.     Pulses: Normal pulses.     Heart sounds: Normal heart sounds. No murmur heard.  No friction rub. No gallop.   Pulmonary:     Effort: Pulmonary effort is normal. No respiratory distress.     Breath sounds: Normal breath sounds. No stridor. No wheezing, rhonchi or rales.  Chest:     Chest wall: No tenderness.  Musculoskeletal:        General: Normal range of motion.     Cervical back: Normal range of motion and neck supple.  Skin:    General: Skin is warm and dry.     Capillary Refill: Capillary refill takes less than 2 seconds.     Coloration: Skin is not jaundiced or pale.     Findings: No bruising, erythema, lesion or rash.  Neurological:     General: No focal deficit present.     Mental Status: She is alert and oriented to person, place, and time. Mental status is at baseline.  Psychiatric:        Mood and Affect: Mood normal.        Behavior: Behavior normal.        Thought Content: Thought content normal.        Judgment: Judgment normal.     Results for orders placed or performed in visit on 06/12/20  CBC with Differential/Platelet  Result Value Ref Range   WBC 5.6 3.4 - 10.8 x10E3/uL   RBC 4.34 3.77 - 5.28 x10E6/uL   Hemoglobin 13.1 11.1 - 15.9 g/dL   Hematocrit 39.2 34.0 - 46.6 %   MCV 90 79 - 97 fL   MCH 30.2 26.6 - 33.0 pg   MCHC 33.4 31 - 35 g/dL   RDW 13.1 11.7 - 15.4 %   Platelets 210 150 - 450 x10E3/uL   Neutrophils 57 Not Estab. %   Lymphs 30 Not Estab. %   Monocytes 9 Not Estab. %   Eos 3 Not Estab. %   Basos 1 Not Estab. %   Neutrophils Absolute 3.2 1.40 - 7.00 x10E3/uL   Lymphocytes Absolute  1.7 0 - 3 x10E3/uL   Monocytes Absolute 0.5 0 - 0 x10E3/uL  EOS (ABSOLUTE) 0.2 0.0 - 0.4 x10E3/uL   Basophils Absolute 0.0 0 - 0 x10E3/uL   Immature Granulocytes 0 Not Estab. %   Immature Grans (Abs) 0.0 0.0 - 0.1 x10E3/uL  Comprehensive metabolic panel  Result Value Ref Range   Glucose 94 65 - 99 mg/dL   BUN 14 8 - 27 mg/dL   Creatinine, Ser 0.88 0.57 - 1.00 mg/dL   GFR calc non Af Amer 69 >59 mL/min/1.73   GFR calc Af Amer 79 >59 mL/min/1.73   BUN/Creatinine Ratio 16 12 - 28   Sodium 142 134 - 144 mmol/L   Potassium 4.5 3.5 - 5.2 mmol/L   Chloride 105 96 - 106 mmol/L   CO2 24 20 - 29 mmol/L   Calcium 9.0 8.7 - 10.3 mg/dL   Total Protein 7.1 6.0 - 8.5 g/dL   Albumin 4.4 3.8 - 4.8 g/dL   Globulin, Total 2.7 1.5 - 4.5 g/dL   Albumin/Globulin Ratio 1.6 1.2 - 2.2   Bilirubin Total 0.2 0.0 - 1.2 mg/dL   Alkaline Phosphatase 106 44 - 121 IU/L   AST 21 0 - 40 IU/L   ALT 13 0 - 32 IU/L  Lipid Panel w/o Chol/HDL Ratio  Result Value Ref Range   Cholesterol, Total 303 (H) 100 - 199 mg/dL   Triglycerides 99 0 - 149 mg/dL   HDL 72 >39 mg/dL   VLDL Cholesterol Cal 17 5 - 40 mg/dL   LDL Chol Calc (NIH) 214 (H) 0 - 99 mg/dL  TSH  Result Value Ref Range   TSH 4.200 0.450 - 4.500 uIU/mL  VITAMIN D 25 Hydroxy (Vit-D Deficiency, Fractures)  Result Value Ref Range   Vit D, 25-Hydroxy 26.1 (L) 30.0 - 100.0 ng/mL  Urinalysis, Routine w reflex microscopic  Result Value Ref Range   Specific Gravity, UA 1.025 1.005 - 1.030   pH, UA 5.0 5.0 - 7.5   Color, UA Yellow Yellow   Appearance Ur Clear Clear   Leukocytes,UA Negative Negative   Protein,UA Negative Negative/Trace   Glucose, UA Negative Negative   Ketones, UA Negative Negative   RBC, UA Negative Negative   Bilirubin, UA Negative Negative   Urobilinogen, Ur 0.2 0.2 - 1.0 mg/dL   Nitrite, UA Negative Negative  Bayer DCA Hb A1c Waived  Result Value Ref Range   HB A1C (BAYER DCA - WAIVED) 5.9 <7.0 %  Magnesium  Result Value Ref  Range   Magnesium 2.1 1.6 - 2.3 mg/dL  Phosphorus  Result Value Ref Range   Phosphorus 3.3 3.0 - 4.3 mg/dL      Assessment & Plan:   Problem List Items Addressed This Visit      Endocrine   IFG (impaired fasting glucose)    Doing well with A1c of 5.9. Continue diet and exercise. Continue to monitor. Call with any concerns.       Relevant Orders   CBC with Differential/Platelet (Completed)   Comprehensive metabolic panel (Completed)     Other   Hyperlipidemia - Primary    Rechecking labs today. Await results. Treat as needed.       Relevant Orders   Comprehensive metabolic panel (Completed)   Lipid Panel w/o Chol/HDL Ratio (Completed)   Bayer DCA Hb A1c Waived (Completed)   Vitamin D deficiency    Rechecking levels today. AWait results. Treat as needed.       Relevant Orders   VITAMIN D 25 Hydroxy (Vit-D Deficiency, Fractures) (Completed)    Other Visit Diagnoses  Leg cramps       Relevant Orders   CBC with Differential/Platelet (Completed)   TSH (Completed)   Urinalysis, Routine w reflex microscopic (Completed)   Magnesium (Completed)   Phosphorus (Completed)       Follow up plan: Return in about 6 months (around 12/10/2020) for Physical.

## 2020-06-13 LAB — CBC WITH DIFFERENTIAL/PLATELET
Basophils Absolute: 0 10*3/uL (ref 0.0–0.2)
Basos: 1 %
EOS (ABSOLUTE): 0.2 10*3/uL (ref 0.0–0.4)
Eos: 3 %
Hematocrit: 39.2 % (ref 34.0–46.6)
Hemoglobin: 13.1 g/dL (ref 11.1–15.9)
Immature Grans (Abs): 0 10*3/uL (ref 0.0–0.1)
Immature Granulocytes: 0 %
Lymphocytes Absolute: 1.7 10*3/uL (ref 0.7–3.1)
Lymphs: 30 %
MCH: 30.2 pg (ref 26.6–33.0)
MCHC: 33.4 g/dL (ref 31.5–35.7)
MCV: 90 fL (ref 79–97)
Monocytes Absolute: 0.5 10*3/uL (ref 0.1–0.9)
Monocytes: 9 %
Neutrophils Absolute: 3.2 10*3/uL (ref 1.4–7.0)
Neutrophils: 57 %
Platelets: 210 10*3/uL (ref 150–450)
RBC: 4.34 x10E6/uL (ref 3.77–5.28)
RDW: 13.1 % (ref 11.7–15.4)
WBC: 5.6 10*3/uL (ref 3.4–10.8)

## 2020-06-13 LAB — COMPREHENSIVE METABOLIC PANEL
ALT: 13 IU/L (ref 0–32)
AST: 21 IU/L (ref 0–40)
Albumin/Globulin Ratio: 1.6 (ref 1.2–2.2)
Albumin: 4.4 g/dL (ref 3.8–4.8)
Alkaline Phosphatase: 106 IU/L (ref 44–121)
BUN/Creatinine Ratio: 16 (ref 12–28)
BUN: 14 mg/dL (ref 8–27)
Bilirubin Total: 0.2 mg/dL (ref 0.0–1.2)
CO2: 24 mmol/L (ref 20–29)
Calcium: 9 mg/dL (ref 8.7–10.3)
Chloride: 105 mmol/L (ref 96–106)
Creatinine, Ser: 0.88 mg/dL (ref 0.57–1.00)
GFR calc Af Amer: 79 mL/min/{1.73_m2} (ref >59)
GFR calc non Af Amer: 69 mL/min/{1.73_m2} (ref >59)
Globulin, Total: 2.7 g/dL (ref 1.5–4.5)
Glucose: 94 mg/dL (ref 65–99)
Potassium: 4.5 mmol/L (ref 3.5–5.2)
Sodium: 142 mmol/L (ref 134–144)
Total Protein: 7.1 g/dL (ref 6.0–8.5)

## 2020-06-13 LAB — PHOSPHORUS: Phosphorus: 3.3 mg/dL (ref 3.0–4.3)

## 2020-06-13 LAB — LIPID PANEL W/O CHOL/HDL RATIO
Cholesterol, Total: 303 mg/dL — ABNORMAL HIGH (ref 100–199)
HDL: 72 mg/dL (ref >39)
LDL Chol Calc (NIH): 214 mg/dL — ABNORMAL HIGH (ref 0–99)
Triglycerides: 99 mg/dL (ref 0–149)
VLDL Cholesterol Cal: 17 mg/dL (ref 5–40)

## 2020-06-13 LAB — TSH: TSH: 4.2 u[IU]/mL (ref 0.450–4.500)

## 2020-06-13 LAB — MAGNESIUM: Magnesium: 2.1 mg/dL (ref 1.6–2.3)

## 2020-06-13 LAB — VITAMIN D 25 HYDROXY (VIT D DEFICIENCY, FRACTURES): Vit D, 25-Hydroxy: 26.1 ng/mL — ABNORMAL LOW (ref 30.0–100.0)

## 2020-06-13 NOTE — Assessment & Plan Note (Signed)
Doing well with A1c of 5.9. Continue diet and exercise. Continue to monitor. Call with any concerns.

## 2020-06-13 NOTE — Assessment & Plan Note (Signed)
Rechecking levels today. AWait results. Treat as needed.

## 2020-06-13 NOTE — Assessment & Plan Note (Signed)
Rechecking labs today. Await results. Treat as needed.  °

## 2020-06-14 ENCOUNTER — Telehealth: Payer: Self-pay

## 2020-06-14 NOTE — Telephone Encounter (Signed)
PA for Cyclobenazeprine initiated and submitted via Cover My Meds. Key: [id number]

## 2020-06-14 NOTE — Telephone Encounter (Signed)
PA approved. Patient notified

## 2020-06-25 ENCOUNTER — Ambulatory Visit: Payer: Managed Care, Other (non HMO) | Attending: Internal Medicine

## 2020-06-25 ENCOUNTER — Other Ambulatory Visit: Payer: Self-pay | Admitting: Internal Medicine

## 2020-06-25 DIAGNOSIS — Z23 Encounter for immunization: Secondary | ICD-10-CM

## 2020-06-25 NOTE — Progress Notes (Signed)
° °  Covid-19 Vaccination Clinic  Name:  Sandra Mendez    MRN: [id number] DOB: 04-10-1956  06/25/2020  [name] was observed post Covid-19 immunization for 15 minutes without incident. She was provided with Vaccine Information Sheet and instruction to access the V-Safe system.   [name] was instructed to call 911 with any severe reactions post vaccine:  Difficulty breathing   Swelling of face and throat   A fast heartbeat   A bad rash all over body   Dizziness and weakness   Immunizations Administered    No immunizations on file.

## 2020-06-28 ENCOUNTER — Ambulatory Visit (INDEPENDENT_AMBULATORY_CARE_PROVIDER_SITE_OTHER): Payer: Medicare HMO

## 2020-06-28 VITALS — Ht 64.0 in | Wt 246.0 lb

## 2020-06-28 DIAGNOSIS — Z0000 Encounter for general adult medical examination without abnormal findings: Secondary | ICD-10-CM

## 2020-06-28 NOTE — Progress Notes (Signed)
I connected with Sandra Mendez today by telephone and verified that I am speaking with the correct person using two identifiers. Location patient: home Location provider: work Persons participating in the virtual visit: Sandra Mendez, Glenna Durand LPN.   I discussed the limitations, risks, security and privacy concerns of performing an evaluation and management service by telephone and the availability of in person appointments. I also discussed with the patient that there may be a patient responsible charge related to this service. The patient expressed understanding and verbally consented to this telephonic visit.    Interactive audio and video telecommunications were attempted between this provider and patient, however failed, due to patient having technical difficulties OR patient did not have access to video capability.  We continued and completed visit with audio only.     Vital signs may be patient reported or missing.  Subjective:   Sandra Mendez is a 64 y.o. female who presents for Medicare Annual (Subsequent) preventive examination.  Review of Systems     Cardiac Risk Factors include: advanced age (>46men, >63 women);obesity (BMI >30kg/m2);sedentary lifestyle     Objective:    Today's Vitals   06/28/20 0811  Weight: 246 lb (111.6 kg)  Height: 5\' 4"  (1.626 m)   Body mass index is 42.23 kg/m.  Advanced Directives 06/28/2020 04/21/2017 02/13/2016  Does Patient Have a Medical Advance Directive? No No No  Would patient like information on creating a medical advance directive? - No - Patient declined -    Current Medications (verified) Outpatient Encounter Medications as of 06/28/2020  Medication Sig  . B Complex Vitamins (B-COMPLEX/B-12) TABS Take 1 tablet by mouth daily.  Marland Kitchen CALCIUM-MAGNESIUM-ZINC PO Take by mouth.  . cyclobenzaprine (FLEXERIL) 10 MG tablet Take 1 tablet (10 mg total) by mouth at bedtime.  . Multiple Vitamin (ONE-DAILY MULTI-VITAMIN PO) Take by  mouth.  . Vitamin D, Ergocalciferol, (DRISDOL) 1.25 MG (50000 UNIT) CAPS capsule Take 1 capsule (50,000 Units total) by mouth every 7 (seven) days.  . TURMERIC PO Take by mouth. (Patient not taking: Reported on 06/28/2020)   No facility-administered encounter medications on file as of 06/28/2020.    Allergies (verified) Erythromycin, Mushroom extract complex, Other, and Penicillins   History: Past Medical History:  Diagnosis Date  . History of colon polyps   . Obesity   . Vitamin D deficiency    Past Surgical History:  Procedure Laterality Date  . [place] SURGERY  2013  . CHOLECYSTECTOMY    . COLONOSCOPY WITH PROPOFOL N/A 04/21/2017   Procedure: COLONOSCOPY WITH PROPOFOL;  Surgeon: Toledo, Benay Pike, MD;  Location: [facility] ENDOSCOPY;  Service: Endoscopy;  Laterality: N/A;  . WISDOM TOOTH EXTRACTION     Family History  Problem Relation Age of Onset  . Diabetes Mother   . Stroke Father   . Colon cancer Sister   . Diabetes Sister   . Diabetes Sister   . Breast cancer Other 33   Social History   Socioeconomic History  . Marital status: Married    Spouse name: Not on file  . Number of children: Not on file  . Years of education: Not on file  . Highest education level: Not on file  Occupational History  . Occupation: retired  Tobacco Use  . Smoking status: Never Smoker  . Smokeless tobacco: Never Used  Vaping Use  . Vaping Use: Never used  Substance and Sexual Activity  . Alcohol use: Yes    Alcohol/week: 0.0 standard drinks    Comment: no  alcohol in 24hrs, drinks beer occasional  . Drug use: No  . Sexual activity: Not Currently  Other Topics Concern  . Not on file  Social History Narrative  . Not on file   Social Determinants of Health   Financial Resource Strain: Low Risk   . Difficulty of Paying Living Expenses: Not hard at all  Food Insecurity: No Food Insecurity  . Worried About Charity fundraiser in the Last Year: Never true  . Ran Out of Food in the  Last Year: Never true  Transportation Needs: No Transportation Needs  . Lack of Transportation (Medical): No  . Lack of Transportation (Non-Medical): No  Physical Activity: Inactive  . Days of Exercise per Week: 0 days  . Minutes of Exercise per Session: 0 min  Stress: No Stress Concern Present  . Feeling of Stress : Not at all  Social Connections:   . Frequency of Communication with Friends and Family: Not on file  . Frequency of Social Gatherings with Friends and Family: Not on file  . Attends Religious Services: Not on file  . Active Member of Clubs or Organizations: Not on file  . Attends Archivist Meetings: Not on file  . Marital Status: Not on file    Tobacco Counseling Counseling given: Not Answered   Clinical Intake:  Pre-visit preparation completed: Yes  Pain : No/denies pain     Nutritional Status: BMI > 30  Obese Nutritional Risks: None Diabetes: No  How often do you need to have someone help you when you read instructions, pamphlets, or other written materials from your doctor or pharmacy?: 1 - Never What is the last grade level you completed in school?: 12th grade  Diabetic? no  Interpreter Needed?: No  Information entered by :: NAllen LPN   Activities of Daily Living In your present state of health, do you have any difficulty performing the following activities: 06/28/2020  Hearing? N  Vision? N  Difficulty concentrating or making decisions? N  Walking or climbing stairs? N  Dressing or bathing? N  Doing errands, shopping? N  Preparing Food and eating ? N  Using the Toilet? N  In the past six months, have you accidently leaked urine? N  Do you have problems with loss of bowel control? N  Managing your Medications? N  Managing your Finances? N  Housekeeping or managing your Housekeeping? N  Some recent data might be hidden    Patient Care Team: Valerie Roys, DO as PCP - General (Family Medicine) Ok Edwards, NP  as Nurse Practitioner (Gastroenterology)  Indicate any recent Medical Services you may have received from other than [facility] providers in the past year (date may be approximate).     Assessment:   This is a routine wellness examination for Sandra Mendez.  Hearing/Vision screen  Hearing Screening   125Hz  250Hz  500Hz  1000Hz  2000Hz  3000Hz  4000Hz  6000Hz  8000Hz   Right ear:           Left ear:           Vision Screening Comments: Regular eye exams, Dr. Ellin Mayhew  Dietary issues and exercise activities discussed: Current Exercise Habits: The patient does not participate in regular exercise at present  Goals    . Patient Stated     06/28/2020, lose 20-25 pounds by June      Depression Screen PHQ 2/9 Scores 06/28/2020 06/16/2019 03/25/2018 03/19/2016  PHQ - 2 Score 0 0 0 0  PHQ- 9 Score - - 0 -  Fall Risk Fall Risk  06/28/2020 06/16/2019 03/25/2018  Falls in the past year? 0 0 No  Number falls in past yr: - 0 -  Injury with Fall? - 0 -  Risk for fall due to : No Fall Risks - -  Follow up Falls evaluation completed;Education provided;Falls prevention discussed - -    Any stairs in or around the home? No  If so, are there any without handrails? n/a Home free of loose throw rugs in walkways, pet beds, electrical cords, etc? Yes  Adequate lighting in your home to reduce risk of falls? Yes   ASSISTIVE DEVICES UTILIZED TO PREVENT FALLS:  Life alert? No  Use of a cane, walker or w/c? No  Grab bars in the bathroom? Yes  Shower chair or bench in shower? No  Elevated toilet seat or a handicapped toilet? No   TIMED UP AND GO:  Was the test performed? No .   Cognitive Function:     6CIT Screen 06/28/2020 06/16/2019  What Year? 0 points 0 points  What month? 0 points 0 points  What time? 0 points 0 points  Count back from 20 0 points 0 points  Months in reverse 0 points 0 points  Repeat phrase 0 points 0 points  Total Score 0 0    Immunizations Immunization History  Administered  Date(s) Administered  . Hepatitis A, Adult 04/24/2016  . Hepatitis B, adult 03/19/2016, 04/24/2016  . Moderna SARS-COV2 Booster Vaccination 06/25/2020  . Moderna SARS-COVID-2 Vaccination 08/31/2019, 09/28/2019  . Pneumococcal Conjugate-13 06/12/2020  . Tdap 09/25/2010    TDAP status: Up to date Flu Vaccine status: Declined, Education has been provided regarding the importance of this vaccine but patient still declined. Advised may receive this vaccine at local pharmacy or Health Dept. Aware to provide a copy of the vaccination record if obtained from local pharmacy or Health Dept. Verbalized acceptance and understanding. Pneumococcal vaccine status: Up to date Covid-19 vaccine status: Completed vaccines  Qualifies for Shingles Vaccine? Yes   Zostavax completed No   Shingrix Completed?: No.    Education has been provided regarding the importance of this vaccine. Patient has been advised to call insurance company to determine out of pocket expense if they have not yet received this vaccine. Advised may also receive vaccine at local pharmacy or Health Dept. Verbalized acceptance and understanding.  Screening Tests Health Maintenance  Topic Date Due  . INFLUENZA VACCINE  10/24/2020 (Originally 02/25/2020)  . TETANUS/TDAP  09/24/2020  . PNA vac Low Risk Adult (2 of 2 - PPSV23) 06/12/2021  . MAMMOGRAM  09/12/2021  . COLONOSCOPY  04/21/2022  . DEXA SCAN  Completed  . COVID-19 Vaccine  Completed  . Hepatitis C Screening  Completed    Health Maintenance  There are no preventive care reminders to display for this patient.  Colorectal cancer screening: Completed 04/21/2017. Repeat every 5 years Mammogram status: Completed 09/13/2019. Repeat every year Bone Density status: Completed 07/03/2019. Results reflect: Bone density results: NORMAL. Repeat every 0 years.  Lung Cancer Screening: (Low Dose CT Chest recommended if Age 47-80 years, 30 pack-year currently smoking OR have quit w/in 15years.)  does not qualify.   Lung Cancer Screening Referral: no  Additional Screening:  Hepatitis C Screening: does qualify; Completed 07/03/2019  Vision Screening: Recommended annual ophthalmology exams for early detection of glaucoma and other disorders of the eye. Is the patient up to date with their annual eye exam?  Yes  Who is the provider or what is  the name of the office in which the patient attends annual eye exams? Dr. Ellin Mayhew If pt is not established with a provider, would they like to be referred to a provider to establish care? No .   Dental Screening: Recommended annual dental exams for proper oral hygiene  Community Resource Referral / Chronic Care Management: CRR required this visit?  No   CCM required this visit?  No      Plan:     I have personally reviewed and noted the following in the patient's chart:   . Medical and social history . Use of alcohol, tobacco or illicit drugs  . Current medications and supplements . Functional ability and status . Nutritional status . Physical activity . Advanced directives . List of other physicians . Hospitalizations, surgeries, and ER visits in previous 12 months . Vitals . Screenings to include cognitive, depression, and falls . Referrals and appointments  In addition, I have reviewed and discussed with patient certain preventive protocols, quality metrics, and best practice recommendations. A written personalized care plan for preventive services as well as general preventive health recommendations were provided to patient.     Kellie Simmering, LPN   32/08/252   Nurse Notes:

## 2020-06-28 NOTE — Patient Instructions (Signed)
Sandra Mendez , Thank you for taking time to come for your Medicare Wellness Visit. I appreciate your ongoing commitment to your health goals. Please review the following plan we discussed and let me know if I can assist you in the future.   Screening recommendations/referrals: Colonoscopy: completed 04/21/2017, due 04/21/2022 Mammogram: completed 09/13/2019, due 09/12/2020 Bone Density: completed 07/03/2019 Recommended yearly ophthalmology/optometry visit for glaucoma screening and checkup Recommended yearly dental visit for hygiene and checkup  Vaccinations: Influenza vaccine: decline Pneumococcal vaccine: completed 06/12/2020 Tdap vaccine: completed 09/25/2010, due 09/24/2020 Shingles vaccine:  discussed   Covid-19: 06/25/2020, 09/28/2019, 08/31/2019  Advanced directives: Advance directive discussed with you today.    Conditions/risks identified: none  Next appointment: Follow up in one year for your annual wellness visit    Preventive Care 65 Years and Older, Female Preventive care refers to lifestyle choices and visits with your health care provider that can promote health and wellness. What does preventive care include?  A yearly physical exam. This is also called an annual well check.  Dental exams once or twice a year.  Routine eye exams. Ask your health care provider how often you should have your eyes checked.  Personal lifestyle choices, including:  Daily care of your teeth and gums.  Regular physical activity.  Eating a healthy diet.  Avoiding tobacco and drug use.  Limiting alcohol use.  Practicing safe sex.  Taking low-dose aspirin every day.  Taking vitamin and mineral supplements as recommended by your health care provider. What happens during an annual well check? The services and screenings done by your health care provider during your annual well check will depend on your age, overall health, lifestyle risk factors, and family history of disease. Counseling    Your health care provider may ask you questions about your:  Alcohol use.  Tobacco use.  Drug use.  Emotional well-being.  Home and relationship well-being.  Sexual activity.  Eating habits.  History of falls.  Memory and ability to understand (cognition).  Work and work Statistician.  Reproductive health. Screening  You may have the following tests or measurements:  Height, weight, and BMI.  Blood pressure.  Lipid and cholesterol levels. These may be checked every 5 years, or more frequently if you are over 46 years old.  Skin check.  Lung cancer screening. You may have this screening every year starting at age 7 if you have a 30-pack-year history of smoking and currently smoke or have quit within the past 15 years.  Fecal occult blood test (FOBT) of the stool. You may have this test every year starting at age 23.  Flexible sigmoidoscopy or colonoscopy. You may have a sigmoidoscopy every 5 years or a colonoscopy every 10 years starting at age 62.  Hepatitis C blood test.  Hepatitis B blood test.  Sexually transmitted disease (STD) testing.  Diabetes screening. This is done by checking your blood sugar (glucose) after you have not eaten for a while (fasting). You may have this done every 1-3 years.  Bone density scan. This is done to screen for osteoporosis. You may have this done starting at age 75.  Mammogram. This may be done every 1-2 years. Talk to your health care provider about how often you should have regular mammograms. Talk with your health care provider about your test results, treatment options, and if necessary, the need for more tests. Vaccines  Your health care provider may recommend certain vaccines, such as:  Influenza vaccine. This is recommended every year.  Tetanus, diphtheria, and acellular pertussis (Tdap, Td) vaccine. You may need a Td booster every 10 years.  Zoster vaccine. You may need this after age 43.  Pneumococcal 13-valent  conjugate (PCV13) vaccine. One dose is recommended after age 68.  Pneumococcal polysaccharide (PPSV23) vaccine. One dose is recommended after age 17. Talk to your health care provider about which screenings and vaccines you need and how often you need them. This information is not intended to replace advice given to you by your health care provider. Make sure you discuss any questions you have with your health care provider. Document Released: 08/09/2015 Document Revised: 04/01/2016 Document Reviewed: 05/14/2015 Elsevier Interactive Patient Education  2017 [place] Prevention in the Home Falls can cause injuries. They can happen to people of all ages. There are many things you can do to make your home safe and to help prevent falls. What can I do on the outside of my home?  Regularly fix the edges of walkways and driveways and fix any cracks.  Remove anything that might make you trip as you walk through a door, such as a raised step or threshold.  Trim any bushes or trees on the path to your home.  Use bright outdoor lighting.  Clear any walking paths of anything that might make someone trip, such as rocks or tools.  Regularly check to see if handrails are loose or broken. Make sure that both sides of any steps have handrails.  Any raised decks and porches should have guardrails on the edges.  Have any leaves, snow, or ice cleared regularly.  Use sand or salt on walking paths during winter.  Clean up any spills in your garage right away. This includes oil or grease spills. What can I do in the bathroom?  Use night lights.  Install grab bars by the toilet and in the tub and shower. Do not use towel bars as grab bars.  Use non-skid mats or decals in the tub or shower.  If you need to sit down in the shower, use a plastic, non-slip stool.  Keep the floor dry. Clean up any water that spills on the floor as soon as it happens.  Remove soap buildup in the tub or  shower regularly.  Attach bath mats securely with double-sided non-slip rug tape.  Do not have throw rugs and other things on the floor that can make you trip. What can I do in the bedroom?  Use night lights.  Make sure that you have a light by your bed that is easy to reach.  Do not use any sheets or blankets that are too big for your bed. They should not hang down onto the floor.  Have a firm chair that has side arms. You can use this for support while you get dressed.  Do not have throw rugs and other things on the floor that can make you trip. What can I do in the kitchen?  Clean up any spills right away.  Avoid walking on wet floors.  Keep items that you use a lot in easy-to-reach places.  If you need to reach something above you, use a strong step stool that has a grab bar.  Keep electrical cords out of the way.  Do not use floor polish or wax that makes floors slippery. If you must use wax, use non-skid floor wax.  Do not have throw rugs and other things on the floor that can make you trip. What can I do  with my stairs?  Do not leave any items on the stairs.  Make sure that there are handrails on both sides of the stairs and use them. Fix handrails that are broken or loose. Make sure that handrails are as long as the stairways.  Check any carpeting to make sure that it is firmly attached to the stairs. Fix any carpet that is loose or worn.  Avoid having throw rugs at the top or bottom of the stairs. If you do have throw rugs, attach them to the floor with carpet tape.  Make sure that you have a light switch at the top of the stairs and the bottom of the stairs. If you do not have them, ask someone to add them for you. What else can I do to help prevent falls?  Wear shoes that:  Do not have high heels.  Have rubber bottoms.  Are comfortable and fit you well.  Are closed at the toe. Do not wear sandals.  If you use a stepladder:  Make sure that it is fully  opened. Do not climb a closed stepladder.  Make sure that both sides of the stepladder are locked into place.  Ask someone to hold it for you, if possible.  Clearly mark and make sure that you can see:  Any grab bars or handrails.  First and last steps.  Where the edge of each step is.  Use tools that help you move around (mobility aids) if they are needed. These include:  Canes.  Walkers.  Scooters.  Crutches.  Turn on the lights when you go into a dark area. Replace any light bulbs as soon as they burn out.  Set up your furniture so you have a clear path. Avoid moving your furniture around.  If any of your floors are uneven, fix them.  If there are any pets around you, be aware of where they are.  Review your medicines with your doctor. Some medicines can make you feel dizzy. This can increase your chance of falling. Ask your doctor what other things that you can do to help prevent falls. This information is not intended to replace advice given to you by your health care provider. Make sure you discuss any questions you have with your health care provider. Document Released: 05/09/2009 Document Revised: 12/19/2015 Document Reviewed: 08/17/2014 Elsevier Interactive Patient Education  2017 [employer].

## 2020-07-11 ENCOUNTER — Other Ambulatory Visit: Payer: Self-pay | Admitting: Internal Medicine

## 2020-07-11 DIAGNOSIS — Z9884 Bariatric surgery status: Secondary | ICD-10-CM

## 2020-07-16 ENCOUNTER — Other Ambulatory Visit: Payer: Self-pay

## 2020-07-16 ENCOUNTER — Ambulatory Visit
Admission: RE | Admit: 2020-07-16 | Discharge: 2020-07-16 | Disposition: A | Discharge status: Home / Self Care | Payer: Medicare HMO | Source: Ambulatory Visit | Attending: Internal Medicine | Admitting: Internal Medicine

## 2020-07-16 ENCOUNTER — Ambulatory Visit: Payer: Managed Care, Other (non HMO)

## 2020-07-16 DIAGNOSIS — Z9884 Bariatric surgery status: Secondary | ICD-10-CM | POA: Diagnosis present

## 2020-07-25 ENCOUNTER — Encounter: Payer: Self-pay | Admitting: Family Medicine

## 2020-07-25 ENCOUNTER — Ambulatory Visit (INDEPENDENT_AMBULATORY_CARE_PROVIDER_SITE_OTHER): Payer: Medicare HMO | Admitting: Family Medicine

## 2020-07-25 ENCOUNTER — Other Ambulatory Visit: Payer: Self-pay

## 2020-07-25 VITALS — BP 120/82 | HR 68 | Temp 98.2°F | Ht 65.0 in | Wt 246.2 lb

## 2020-07-25 DIAGNOSIS — Z0000 Encounter for general adult medical examination without abnormal findings: Secondary | ICD-10-CM

## 2020-07-25 DIAGNOSIS — E782 Mixed hyperlipidemia: Secondary | ICD-10-CM | POA: Diagnosis not present

## 2020-07-25 DIAGNOSIS — Z23 Encounter for immunization: Secondary | ICD-10-CM | POA: Diagnosis not present

## 2020-07-25 DIAGNOSIS — R7301 Impaired fasting glucose: Secondary | ICD-10-CM

## 2020-07-25 DIAGNOSIS — R030 Elevated blood-pressure reading, without diagnosis of hypertension: Secondary | ICD-10-CM | POA: Diagnosis not present

## 2020-07-25 DIAGNOSIS — E559 Vitamin D deficiency, unspecified: Secondary | ICD-10-CM

## 2020-07-25 LAB — URINALYSIS, ROUTINE W REFLEX MICROSCOPIC
Bilirubin, UA: NEGATIVE
Glucose, UA: NEGATIVE
Ketones, UA: NEGATIVE
Leukocytes,UA: NEGATIVE
Nitrite, UA: NEGATIVE
Protein,UA: NEGATIVE
RBC, UA: NEGATIVE
Specific Gravity, UA: 1.02 (ref 1.005–1.030)
Urobilinogen, Ur: 0.2 mg/dL (ref 0.2–1.0)
pH, UA: 5 (ref 5.0–7.5)

## 2020-07-25 LAB — MICROALBUMIN, URINE WAIVED
Creatinine, Urine Waived: 100 mg/dL (ref 10–300)
Microalb, Ur Waived: 10 mg/L (ref 0–19)
Microalb/Creat Ratio: <30 mg/g (ref <30)

## 2020-07-25 LAB — BAYER DCA HB A1C WAIVED: HB A1C (BAYER DCA - WAIVED): 5.7 % (ref <7.0)

## 2020-07-25 NOTE — Assessment & Plan Note (Signed)
To have gastric sleeve. Working with bariatrics. Continue to monitor. Call with any concerns.

## 2020-07-25 NOTE — Assessment & Plan Note (Signed)
Rechecking labs today. Await results. Treat as needed.  °

## 2020-07-25 NOTE — Progress Notes (Signed)
BP 120/82 (BP Location: Left Arm, Patient Position: Sitting, Cuff Size: Large)   Pulse 68   Temp 98.2 F (36.8 C)   Ht 5\' 5"  (1.651 m)   Wt 246 lb 3.2 oz (111.7 kg)   SpO2 100%   BMI 40.97 kg/m    Subjective:    Patient ID: Sandra Mendez, female    DOB: 1956/07/27, 64 y.o.   MRN: [id number]  HPI: Sandra Mendez is a 64 y.o. female presenting on 07/25/2020 for comprehensive medical examination. Current medical complaints include:  Planning on having a gastric sleeve through [facility].  Impaired Fasting Glucose HbA1C:  Lab Results  Component Value Date   HGBA1C 5.9 06/12/2020   Duration of elevated blood sugar: chronic Polydipsia: no Polyuria: no Weight change: no Visual disturbance: yes Glucose Monitoring: no    Accucheck frequency: Not Checking Diabetic Education: Not Completed Family history of diabetes: yes  HYPERLIPIDEMIA Hyperlipidemia status: status Satisfied with current treatment?  yes Side effects:  Not on anything Past cholesterol meds: none Supplements: none Aspirin:  no The 10-year ASCVD risk score Mikey Bussing DC Jr., et al., 2013) is: 10.3%   Values used to calculate the score:     Age: 30 years     Sex: Female     Is Non-Hispanic African American: Yes     Diabetic: No     Tobacco smoker: No     Systolic Blood Pressure: 123456 mmHg     Is BP treated: No     HDL Cholesterol: 72 mg/dL     Total Cholesterol: 303 mg/dL Chest pain:  no Coronary artery disease:  no  Menopausal Symptoms: no  Depression Screen done today and results listed below:  Depression screen [facility] 2/9 07/25/2020 06/28/2020 06/16/2019 03/25/2018 03/19/2016  Decreased Interest 0 0 0 0 0  Down, Depressed, Hopeless 0 0 0 0 0  PHQ - 2 Score 0 0 0 0 0  Altered sleeping - - - 0 -  Tired, decreased energy - - - 0 -  Change in appetite - - - 0 -  Feeling bad or failure about yourself  - - - 0 -  Trouble concentrating - - - 0 -  Moving slowly or fidgety/restless - - - 0 -  Suicidal  thoughts - - - 0 -  PHQ-9 Score - - - 0 -  Difficult doing work/chores - - - Not difficult at all -    Past Medical History:  Past Medical History:  Diagnosis Date  . History of colon polyps   . Obesity   . Vitamin D deficiency     Surgical History:  Past Surgical History:  Procedure Laterality Date  . [place] SURGERY  2013  . CHOLECYSTECTOMY    . COLONOSCOPY WITH PROPOFOL N/A 04/21/2017   Procedure: COLONOSCOPY WITH PROPOFOL;  Surgeon: Toledo, Benay Pike, MD;  Location: [facility] ENDOSCOPY;  Service: Endoscopy;  Laterality: N/A;  . WISDOM TOOTH EXTRACTION      Medications:  Current Outpatient Medications on File Prior to Visit  Medication Sig  . cyclobenzaprine (FLEXERIL) 10 MG tablet Take 1 tablet (10 mg total) by mouth at bedtime.  . Multiple Vitamin (ONE-DAILY MULTI-VITAMIN PO) Take by mouth.  . Vitamin D, Ergocalciferol, (DRISDOL) 1.25 MG (50000 UNIT) CAPS capsule Take 1 capsule (50,000 Units total) by mouth every 7 (seven) days.   No current facility-administered medications on file prior to visit.    Allergies:  Allergies  Allergen Reactions  . Erythromycin Rash  .  Mushroom Extract Complex   . Other Itching and Rash    Green peppers  . Penicillins Rash    Social History:  Social History   Socioeconomic History  . Marital status: Married    Spouse name: Not on file  . Number of children: Not on file  . Years of education: Not on file  . Highest education level: Not on file  Occupational History  . Occupation: retired  Tobacco Use  . Smoking status: Never Smoker  . Smokeless tobacco: Never Used  Vaping Use  . Vaping Use: Never used  Substance and Sexual Activity  . Alcohol use: Yes    Alcohol/week: 0.0 standard drinks    Comment: no alcohol in 24hrs, drinks beer occasional  . Drug use: No  . Sexual activity: Not Currently  Other Topics Concern  . Not on file  Social History Narrative  . Not on file   Social Determinants of Health   Financial  Resource Strain: Low Risk   . Difficulty of Paying Living Expenses: Not hard at all  Food Insecurity: No Food Insecurity  . Worried About Programme researcher, broadcasting/film/videounning Out of Food in the Last Year: Never true  . Ran Out of Food in the Last Year: Never true  Transportation Needs: No Transportation Needs  . Lack of Transportation (Medical): No  . Lack of Transportation (Non-Medical): No  Physical Activity: Inactive  . Days of Exercise per Week: 0 days  . Minutes of Exercise per Session: 0 min  Stress: No Stress Concern Present  . Feeling of Stress : Not at all  Social Connections: Not on file  Intimate Partner Violence: Not on file   Social History   Tobacco Use  Smoking Status Never Smoker  Smokeless Tobacco Never Used   Social History   Substance and Sexual Activity  Alcohol Use Yes  . Alcohol/week: 0.0 standard drinks   Comment: no alcohol in 24hrs, drinks beer occasional    Family History:  Family History  Problem Relation Age of Onset  . Diabetes Mother   . Stroke Father   . Colon cancer Sister   . Diabetes Sister   . Diabetes Sister   . Breast cancer Other 42    Past medical history, surgical history, medications, allergies, family history and social history reviewed with patient today and changes made to appropriate areas of the chart.   Review of Systems  Constitutional: Negative.   HENT: Negative.   Eyes: Positive for blurred vision. Negative for double vision, photophobia, pain, discharge and redness.  Respiratory: Negative.   Cardiovascular: Negative.   Gastrointestinal: Positive for constipation. Negative for abdominal pain, blood in stool, diarrhea, heartburn, melena, nausea and vomiting.  Genitourinary: Negative.   Musculoskeletal: Negative.   Skin: Negative.   Neurological: Negative.   Endo/Heme/Allergies: Negative.  Negative for environmental allergies and polydipsia. Does not bruise/bleed easily.  Psychiatric/Behavioral: Negative.     All other ROS negative except  what is listed above and in the HPI.      Objective:    BP 120/82 (BP Location: Left Arm, Patient Position: Sitting, Cuff Size: Large)   Pulse 68   Temp 98.2 F (36.8 C)   Ht 5\' 5"  (1.651 m)   Wt 246 lb 3.2 oz (111.7 kg)   SpO2 100%   BMI 40.97 kg/m   Wt Readings from Last 3 Encounters:  07/25/20 246 lb 3.2 oz (111.7 kg)  06/28/20 246 lb (111.6 kg)  06/12/20 244 lb 3.2 oz (110.8 kg)  Physical Exam Vitals and nursing note reviewed.  Constitutional:      General: She is not in acute distress.    Appearance: Normal appearance. She is not ill-appearing, toxic-appearing or diaphoretic.  HENT:     Head: Normocephalic and atraumatic.     Right Ear: Tympanic membrane, ear canal and external ear normal. There is no impacted cerumen.     Left Ear: Tympanic membrane, ear canal and external ear normal. There is no impacted cerumen.     Nose: Nose normal. No congestion or rhinorrhea.     Mouth/Throat:     Mouth: Mucous membranes are moist.     Pharynx: Oropharynx is clear. No oropharyngeal exudate or posterior oropharyngeal erythema.  Eyes:     General: No scleral icterus.       Right eye: No discharge.        Left eye: No discharge.     Extraocular Movements: Extraocular movements intact.     Conjunctiva/sclera: Conjunctivae normal.     Pupils: Pupils are equal, round, and reactive to light.  Neck:     Vascular: No carotid bruit.  Cardiovascular:     Rate and Rhythm: Normal rate and regular rhythm.     Pulses: Normal pulses.     Heart sounds: No murmur heard. No friction rub. No gallop.   Pulmonary:     Effort: Pulmonary effort is normal. No respiratory distress.     Breath sounds: Normal breath sounds. No stridor. No wheezing, rhonchi or rales.  Chest:     Chest wall: No tenderness.  Abdominal:     General: Abdomen is flat. Bowel sounds are normal. There is no distension.     Palpations: Abdomen is soft. There is no mass.     Tenderness: There is no abdominal tenderness.  There is no right CVA tenderness, left CVA tenderness, guarding or rebound.     Hernia: No hernia is present.  Genitourinary:    Comments: Breast and pelvic exams deferred with shared decision making Musculoskeletal:        General: No swelling, tenderness, deformity or signs of injury.     Cervical back: Normal range of motion and neck supple. No rigidity. No muscular tenderness.     Right lower leg: No edema.     Left lower leg: No edema.  Lymphadenopathy:     Cervical: No cervical adenopathy.  Skin:    General: Skin is warm and dry.     Capillary Refill: Capillary refill takes less than 2 seconds.     Coloration: Skin is not jaundiced or pale.     Findings: No bruising, erythema, lesion or rash.  Neurological:     General: No focal deficit present.     Mental Status: She is alert and oriented to person, place, and time. Mental status is at baseline.     Cranial Nerves: No cranial nerve deficit.     Sensory: No sensory deficit.     Motor: No weakness.     Coordination: Coordination normal.     Gait: Gait normal.     Deep Tendon Reflexes: Reflexes normal.  Psychiatric:        Mood and Affect: Mood normal.        Behavior: Behavior normal.        Thought Content: Thought content normal.        Judgment: Judgment normal.     Results for orders placed or performed in visit on 06/12/20  CBC with Differential/Platelet  Result  Value Ref Range   WBC 5.6 3.4 - 10.8 x10E3/uL   RBC 4.34 3.77 - 5.28 x10E6/uL   Hemoglobin 13.1 11.1 - 15.9 g/dL   Hematocrit 64.3 32.9 - 46.6 %   MCV 90 79 - 97 fL   MCH 30.2 26.6 - 33.0 pg   MCHC 33.4 31.5 - 35.7 g/dL   RDW 51.8 84.1 - 66.0 %   Platelets 210 150 - 450 x10E3/uL   Neutrophils 57 Not Estab. %   Lymphs 30 Not Estab. %   Monocytes 9 Not Estab. %   Eos 3 Not Estab. %   Basos 1 Not Estab. %   Neutrophils Absolute 3.2 1.4 - 7.0 x10E3/uL   Lymphocytes Absolute 1.7 0.7 - 3.1 x10E3/uL   Monocytes Absolute 0.5 0.1 - 0.9 x10E3/uL   EOS  (ABSOLUTE) 0.2 0.0 - 0.4 x10E3/uL   Basophils Absolute 0.0 0.0 - 0.2 x10E3/uL   Immature Granulocytes 0 Not Estab. %   Immature Grans (Abs) 0.0 0.0 - 0.1 x10E3/uL  Comprehensive metabolic panel  Result Value Ref Range   Glucose 94 65 - 99 mg/dL   BUN 14 8 - 27 mg/dL   Creatinine, Ser 6.30 0.57 - 1.00 mg/dL   GFR calc non Af Amer 69 >59 mL/min/1.73   GFR calc Af Amer 79 >59 mL/min/1.73   BUN/Creatinine Ratio 16 12 - 28   Sodium 142 134 - 144 mmol/L   Potassium 4.5 3.5 - 5.2 mmol/L   Chloride 105 96 - 106 mmol/L   CO2 24 20 - 29 mmol/L   Calcium 9.0 8.7 - 10.3 mg/dL   Total Protein 7.1 6.0 - 8.5 g/dL   Albumin 4.4 3.8 - 4.8 g/dL   Globulin, Total 2.7 1.5 - 4.5 g/dL   Albumin/Globulin Ratio 1.6 1.2 - 2.2   Bilirubin Total 0.2 0.0 - 1.2 mg/dL   Alkaline Phosphatase 106 44 - 121 IU/L   AST 21 0 - 40 IU/L   ALT 13 0 - 32 IU/L  Lipid Panel w/o Chol/HDL Ratio  Result Value Ref Range   Cholesterol, Total 303 (H) 100 - 199 mg/dL   Triglycerides 99 0 - 149 mg/dL   HDL 72 >16 mg/dL   VLDL Cholesterol Cal 17 5 - 40 mg/dL   LDL Chol Calc (NIH) 010 (H) 0 - 99 mg/dL  TSH  Result Value Ref Range   TSH 4.200 0.450 - 4.500 uIU/mL  VITAMIN D 25 Hydroxy (Vit-D Deficiency, Fractures)  Result Value Ref Range   Vit D, 25-Hydroxy 26.1 (L) 30.0 - 100.0 ng/mL  Urinalysis, Routine w reflex microscopic  Result Value Ref Range   Specific Gravity, UA 1.025 1.005 - 1.030   pH, UA 5.0 5.0 - 7.5   Color, UA Yellow Yellow   Appearance Ur Clear Clear   Leukocytes,UA Negative Negative   Protein,UA Negative Negative/Trace   Glucose, UA Negative Negative   Ketones, UA Negative Negative   RBC, UA Negative Negative   Bilirubin, UA Negative Negative   Urobilinogen, Ur 0.2 0.2 - 1.0 mg/dL   Nitrite, UA Negative Negative  Bayer DCA Hb A1c Waived  Result Value Ref Range   HB A1C (BAYER DCA - WAIVED) 5.9 <7.0 %  Magnesium  Result Value Ref Range   Magnesium 2.1 1.6 - 2.3 mg/dL  Phosphorus  Result Value  Ref Range   Phosphorus 3.3 3.0 - 4.3 mg/dL      Assessment & Plan:   Problem List Items Addressed This Visit  Endocrine   IFG (impaired fasting glucose)    Rechecking labs today. Await results. Treat as needed.       Relevant Orders   CBC with Differential/Platelet   Comprehensive metabolic panel   Urinalysis, Routine w reflex microscopic   Microalbumin, Urine Waived   Bayer DCA Hb A1c Waived     Other   Hyperlipidemia    Rechecking labs today. Await results. Treat as needed.       Relevant Orders   CBC with Differential/Platelet   Comprehensive metabolic panel   Lipid Panel w/o Chol/HDL Ratio   Vitamin D deficiency    Rechecking labs today. Await results. Treat as needed.       Relevant Orders   VITAMIN D 25 Hydroxy (Vit-D Deficiency, Fractures)   Morbid obesity ([place])    To have gastric sleeve. Working with bariatrics. Continue to monitor. Call with any concerns.        Other Visit Diagnoses    Routine general medical examination at a health care facility    -  Primary   Vaccines up to date. Screening labs checked today. Mammo, DEXA and Colonoscopy up to date. Continue diet and exercise. Call with any concerns.    Elevated blood-pressure reading without diagnosis of hypertension       Better on recheck. [place] to work on diet and exercise. Call with any concerns.    Relevant Orders   TSH   Need for influenza vaccination       Flu shot given today.   Relevant Orders   Flu Vaccine QUAD High Dose(Fluad) (Completed)       Follow up plan: Return in about 6 months (around 01/23/2021).   LABORATORY TESTING:  - Pap smear: not applicable  IMMUNIZATIONS:   - Tdap: Tetanus vaccination status reviewed: last tetanus booster within 10 years. - Influenza: Administered today - Pneumovax: Not applicable - Prevnar: Up to date - COVID: Up to date  SCREENING: -Mammogram: Up to date  - Colonoscopy: Up to date  - Bone Density: Up to date   PATIENT COUNSELING:    Advised to take 1 mg of folate supplement per day if capable of pregnancy.   Sexuality: Discussed sexually transmitted diseases, partner selection, use of condoms, avoidance of unintended pregnancy  and contraceptive alternatives.   Advised to avoid cigarette smoking.  I discussed with the patient that most people either abstain from alcohol or drink within safe limits (<=14/week and <=4 drinks/occasion for males, <=7/weeks and <= 3 drinks/occasion for females) and that the risk for alcohol disorders and other health effects rises proportionally with the number of drinks per week and how often a drinker exceeds daily limits.  Discussed cessation/primary prevention of drug use and availability of treatment for abuse.   Diet: Encouraged to adjust caloric intake to maintain  or achieve ideal body weight, to reduce intake of dietary saturated fat and total fat, to limit sodium intake by avoiding high sodium foods and not adding table salt, and to maintain adequate dietary potassium and calcium preferably from fresh fruits, vegetables, and low-fat dairy products.    stressed the importance of regular exercise  Injury prevention: Discussed safety belts, safety helmets, smoke detector, smoking near bedding or upholstery.   Dental health: Discussed importance of regular tooth brushing, flossing, and dental visits.    NEXT PREVENTATIVE PHYSICAL DUE IN 1 YEAR. Return in about 6 months (around 01/23/2021).

## 2020-07-25 NOTE — Patient Instructions (Signed)

## 2020-07-26 LAB — CBC WITH DIFFERENTIAL/PLATELET
Basophils Absolute: 0 10*3/uL (ref 0.0–0.2)
Basos: 1 %
EOS (ABSOLUTE): 0.2 10*3/uL (ref 0.0–0.4)
Eos: 4 %
Hematocrit: 40.2 % (ref 34.0–46.6)
Hemoglobin: 13 g/dL (ref 11.1–15.9)
Immature Grans (Abs): 0 10*3/uL (ref 0.0–0.1)
Immature Granulocytes: 0 %
Lymphocytes Absolute: 1.9 10*3/uL (ref 0.7–3.1)
Lymphs: 31 %
MCH: 30.7 pg (ref 26.6–33.0)
MCHC: 32.3 g/dL (ref 31.5–35.7)
MCV: 95 fL (ref 79–97)
Monocytes Absolute: 0.5 10*3/uL (ref 0.1–0.9)
Monocytes: 9 %
Neutrophils Absolute: 3.4 10*3/uL (ref 1.4–7.0)
Neutrophils: 55 %
Platelets: 197 10*3/uL (ref 150–450)
RBC: 4.23 x10E6/uL (ref 3.77–5.28)
RDW: 13 % (ref 11.7–15.4)
WBC: 6 10*3/uL (ref 3.4–10.8)

## 2020-07-26 LAB — COMPREHENSIVE METABOLIC PANEL
ALT: 13 IU/L (ref 0–32)
AST: 22 IU/L (ref 0–40)
Albumin/Globulin Ratio: 1.7 (ref 1.2–2.2)
Albumin: 4.3 g/dL (ref 3.8–4.8)
Alkaline Phosphatase: 108 IU/L (ref 44–121)
BUN/Creatinine Ratio: 15 (ref 12–28)
BUN: 14 mg/dL (ref 8–27)
Bilirubin Total: 0.2 mg/dL (ref 0.0–1.2)
CO2: 25 mmol/L (ref 20–29)
Calcium: 9.2 mg/dL (ref 8.7–10.3)
Chloride: 104 mmol/L (ref 96–106)
Creatinine, Ser: 0.95 mg/dL (ref 0.57–1.00)
GFR calc Af Amer: 72 mL/min/{1.73_m2} (ref >59)
GFR calc non Af Amer: 63 mL/min/{1.73_m2} (ref >59)
Globulin, Total: 2.6 g/dL (ref 1.5–4.5)
Glucose: 81 mg/dL (ref 65–99)
Potassium: 4.3 mmol/L (ref 3.5–5.2)
Sodium: 143 mmol/L (ref 134–144)
Total Protein: 6.9 g/dL (ref 6.0–8.5)

## 2020-07-26 LAB — LIPID PANEL W/O CHOL/HDL RATIO
Cholesterol, Total: 296 mg/dL — ABNORMAL HIGH (ref 100–199)
HDL: 72 mg/dL (ref >39)
LDL Chol Calc (NIH): 203 mg/dL — ABNORMAL HIGH (ref 0–99)
Triglycerides: 120 mg/dL (ref 0–149)
VLDL Cholesterol Cal: 21 mg/dL (ref 5–40)

## 2020-07-26 LAB — VITAMIN D 25 HYDROXY (VIT D DEFICIENCY, FRACTURES): Vit D, 25-Hydroxy: 21.2 ng/mL — ABNORMAL LOW (ref 30.0–100.0)

## 2020-07-26 LAB — TSH: TSH: 4.61 u[IU]/mL — ABNORMAL HIGH (ref 0.450–4.500)

## 2020-11-06 ENCOUNTER — Other Ambulatory Visit: Payer: Self-pay | Admitting: Family Medicine

## 2020-11-06 DIAGNOSIS — Z1231 Encounter for screening mammogram for malignant neoplasm of breast: Secondary | ICD-10-CM

## 2020-11-15 ENCOUNTER — Other Ambulatory Visit: Payer: Self-pay

## 2020-11-15 ENCOUNTER — Ambulatory Visit
Admission: RE | Admit: 2020-11-15 | Discharge: 2020-11-15 | Disposition: A | Discharge status: Home / Self Care | Payer: Medicare HMO | Source: Ambulatory Visit | Attending: Family Medicine | Admitting: Family Medicine

## 2020-11-15 DIAGNOSIS — Z1231 Encounter for screening mammogram for malignant neoplasm of breast: Secondary | ICD-10-CM | POA: Diagnosis present

## 2020-12-10 ENCOUNTER — Other Ambulatory Visit: Payer: Self-pay

## 2020-12-10 ENCOUNTER — Ambulatory Visit: Payer: Medicare HMO | Attending: Internal Medicine

## 2020-12-10 DIAGNOSIS — Z23 Encounter for immunization: Secondary | ICD-10-CM

## 2020-12-10 MED ORDER — MODERNA COVID-19 VACCINE 100 MCG/0.5ML IM SUSP
INTRAMUSCULAR | 0 refills | Status: DC
  Filled 2020-12-10: qty 0.25, 1d supply, fill #0

## 2020-12-10 NOTE — Progress Notes (Signed)
   Covid-19 Vaccination Clinic  Name:  Sandra Mendez    MRN: [id number] DOB: [birth year]  12/10/2020  [name] was observed post Covid-19 immunization for 15 minutes without incident. She was provided with Vaccine Information Sheet and instruction to access the V-Safe system.   [name] was instructed to call 911 with any severe reactions post vaccine: Marland Kitchen Difficulty breathing  . Swelling of face and throat  . A fast heartbeat  . A bad rash all over body  . Dizziness and weakness   Immunizations Administered    Name Date Dose VIS Date Route   Moderna Covid-19 Booster Vaccine 12/10/2020  1:03 PM 0.25 mL 05/15/2020 Intramuscular   Manufacturer: Levan Hurst   Lot: [id number]   Why: Bond, PharmD, MBA Clinical Acute Care Pharmacist

## 2020-12-18 ENCOUNTER — Encounter: Payer: Medicare HMO | Admitting: Family Medicine

## 2021-01-24 ENCOUNTER — Encounter: Payer: Self-pay | Admitting: Family Medicine

## 2021-01-24 ENCOUNTER — Other Ambulatory Visit: Payer: Self-pay

## 2021-01-24 ENCOUNTER — Ambulatory Visit (INDEPENDENT_AMBULATORY_CARE_PROVIDER_SITE_OTHER): Payer: Medicare HMO | Admitting: Family Medicine

## 2021-01-24 VITALS — BP 119/73 | HR 64 | Temp 97.9°F | Ht 65.0 in | Wt 242.2 lb

## 2021-01-24 DIAGNOSIS — Z9884 Bariatric surgery status: Secondary | ICD-10-CM

## 2021-01-24 DIAGNOSIS — E782 Mixed hyperlipidemia: Secondary | ICD-10-CM | POA: Diagnosis not present

## 2021-01-24 DIAGNOSIS — E559 Vitamin D deficiency, unspecified: Secondary | ICD-10-CM

## 2021-01-24 DIAGNOSIS — R7301 Impaired fasting glucose: Secondary | ICD-10-CM

## 2021-01-24 LAB — BAYER DCA HB A1C WAIVED: HB A1C (BAYER DCA - WAIVED): 5.9 % (ref <7.0)

## 2021-01-24 MED ORDER — CYCLOBENZAPRINE HCL 10 MG PO TABS: 10.0000 mg | ORAL_TABLET | Freq: Every day | ORAL | 0 refills | Status: DC

## 2021-01-24 NOTE — Assessment & Plan Note (Signed)
Rechecking labs today. Await results. Treat as needed. Would be interested in ozempic if it goes up.

## 2021-01-24 NOTE — Assessment & Plan Note (Signed)
Rechecking labs today. Await results. Treat as needed.  °

## 2021-01-24 NOTE — Progress Notes (Signed)
BP 119/73   Pulse 64   Temp 97.9 F (36.6 C)   Ht 5\' 5"  (1.651 m)   Wt 242 lb 3.2 oz (109.9 kg)   SpO2 97%   BMI 40.30 kg/m    Subjective:    Patient ID: Sandra Mendez, female    DOB: [birth year], 65 y.o.   MRN: [id number]  HPI: Sandra Mendez is a 65 y.o. female  Chief Complaint  Patient presents with   Hyperlipidemia   Obesity   IFG   HYPERLIPIDEMIA Hyperlipidemia status: stable Satisfied with current treatment?  yes Side effects:  not on anything Supplements: none Aspirin:  no The 10-year ASCVD risk score Mikey Bussing DC Jr., et al., 2013) is: 11%   Values used to calculate the score:     Age: 56 years     Sex: Female     Is Non-Hispanic African American: Yes     Diabetic: No     Tobacco smoker: No     Systolic Blood Pressure: 078 mmHg     Is BP treated: No     HDL Cholesterol: 72 mg/dL     Total Cholesterol: 296 mg/dL Chest pain:  no Coronary artery disease:  no Family history CAD:  yes  Impaired Fasting Glucose HbA1C:  Lab Results  Component Value Date   HGBA1C 5.7 07/25/2020   Duration of elevated blood sugar: chronic Polydipsia: no Polyuria: no Weight change: no Visual disturbance: no Glucose Monitoring: no Diabetic Education: Not Completed Family history of diabetes: yes  Relevant past medical, surgical, family and social history reviewed and updated as indicated. Interim medical history since our last visit reviewed. Allergies and medications reviewed and updated.  Review of Systems  Constitutional: Negative.   Respiratory: Negative.    Cardiovascular: Negative.   Gastrointestinal: Negative.   Musculoskeletal:  Positive for myalgias. Negative for arthralgias, back pain, gait problem, joint swelling, neck pain and neck stiffness.  Psychiatric/Behavioral: Negative.     Per HPI unless specifically indicated above     Objective:    BP 119/73   Pulse 64   Temp 97.9 F (36.6 C)   Ht 5\' 5"  (1.651 m)   Wt 242 lb 3.2 oz (109.9 kg)   SpO2  97%   BMI 40.30 kg/m   Wt Readings from Last 3 Encounters:  01/24/21 242 lb 3.2 oz (109.9 kg)  07/25/20 246 lb 3.2 oz (111.7 kg)  06/28/20 246 lb (111.6 kg)    Physical Exam Vitals and nursing note reviewed.  Constitutional:      General: She is not in acute distress.    Appearance: Normal appearance. She is obese. She is not ill-appearing, toxic-appearing or diaphoretic.  HENT:     Head: Normocephalic and atraumatic.     Right Ear: External ear normal.     Left Ear: External ear normal.     Nose: Nose normal.     Mouth/Throat:     Mouth: Mucous membranes are moist.     Pharynx: Oropharynx is clear.  Eyes:     General: No scleral icterus.       Right eye: No discharge.        Left eye: No discharge.     Extraocular Movements: Extraocular movements intact.     Conjunctiva/sclera: Conjunctivae normal.     Pupils: Pupils are equal, round, and reactive to light.  Cardiovascular:     Rate and Rhythm: Normal rate and regular rhythm.     Pulses: Normal pulses.  Heart sounds: Normal heart sounds. No murmur heard.   No friction rub. No gallop.  Pulmonary:     Effort: Pulmonary effort is normal. No respiratory distress.     Breath sounds: Normal breath sounds. No stridor. No wheezing, rhonchi or rales.  Chest:     Chest wall: No tenderness.  Musculoskeletal:        General: Normal range of motion.     Cervical back: Normal range of motion and neck supple.  Skin:    General: Skin is warm and dry.     Capillary Refill: Capillary refill takes less than 2 seconds.     Coloration: Skin is not jaundiced or pale.     Findings: No bruising, erythema, lesion or rash.  Neurological:     General: No focal deficit present.     Mental Status: She is alert and oriented to person, place, and time. Mental status is at baseline.  Psychiatric:        Mood and Affect: Mood normal.        Behavior: Behavior normal.        Thought Content: Thought content normal.        Judgment: Judgment  normal.    Results for orders placed or performed in visit on 07/25/20  CBC with Differential/Platelet  Result Value Ref Range   WBC 6.0 3.4 - 10.8 x10E3/uL   RBC 4.23 3.77 - 5.28 x10E6/uL   Hemoglobin 13.0 11.1 - 15.9 g/dL   Hematocrit 40.2 34.0 - 46.6 %   MCV 95 79 - 97 fL   MCH 30.7 26.6 - 33.0 pg   MCHC 32.3 31.5 - 35.7 g/dL   RDW 13.0 11.7 - 15.4 %   Platelets 197 150 - 450 x10E3/uL   Neutrophils 55 Not Estab. %   Lymphs 31 Not Estab. %   Monocytes 9 Not Estab. %   Eos 4 Not Estab. %   Basos 1 Not Estab. %   Neutrophils Absolute 3.4 1.4 - 7.0 x10E3/uL   Lymphocytes Absolute 1.9 0.7 - 3.1 x10E3/uL   Monocytes Absolute 0.5 0.1 - 0.9 x10E3/uL   EOS (ABSOLUTE) 0.2 0.0 - 0.4 x10E3/uL   Basophils Absolute 0.0 0.0 - 0.2 x10E3/uL   Immature Granulocytes 0 Not Estab. %   Immature Grans (Abs) 0.0 0.0 - 0.1 x10E3/uL  Comprehensive metabolic panel  Result Value Ref Range   Glucose 81 65 - 99 mg/dL   BUN 14 8 - 27 mg/dL   Creatinine, Ser 0.95 0.57 - 1.00 mg/dL   GFR calc non Af Amer 63 >59 mL/min/1.73   GFR calc Af Amer 72 >59 mL/min/1.73   BUN/Creatinine Ratio 15 12 - 28   Sodium 143 134 - 144 mmol/L   Potassium 4.3 3.5 - 5.2 mmol/L   Chloride 104 96 - 106 mmol/L   CO2 25 20 - 29 mmol/L   Calcium 9.2 8.7 - 10.3 mg/dL   Total Protein 6.9 6.0 - 8.5 g/dL   Albumin 4.3 3.8 - 4.8 g/dL   Globulin, Total 2.6 1.5 - 4.5 g/dL   Albumin/Globulin Ratio 1.7 1.2 - 2.2   Bilirubin Total 0.2 0.0 - 1.2 mg/dL   Alkaline Phosphatase 108 44 - 121 IU/L   AST 22 0 - 40 IU/L   ALT 13 0 - 32 IU/L  Lipid Panel w/o Chol/HDL Ratio  Result Value Ref Range   Cholesterol, Total 296 (H) 100 - 199 mg/dL   Triglycerides 120 0 - 149 mg/dL   HDL  72 >39 mg/dL   VLDL Cholesterol Cal 21 5 - 40 mg/dL   LDL Chol Calc (NIH) 203 (H) 0 - 99 mg/dL  Urinalysis, Routine w reflex microscopic  Result Value Ref Range   Specific Gravity, UA 1.020 1.005 - 1.030   pH, UA 5.0 5.0 - 7.5   Color, UA Yellow Yellow    Appearance Ur Clear Clear   Leukocytes,UA Negative Negative   Protein,UA Negative Negative/Trace   Glucose, UA Negative Negative   Ketones, UA Negative Negative   RBC, UA Negative Negative   Bilirubin, UA Negative Negative   Urobilinogen, Ur 0.2 0.2 - 1.0 mg/dL   Nitrite, UA Negative Negative  TSH  Result Value Ref Range   TSH 4.610 (H) 0.450 - 4.500 uIU/mL  Microalbumin, Urine Waived  Result Value Ref Range   Microalb, Ur Waived 10 0 - 19 mg/L   Creatinine, Urine Waived 100 10 - 300 mg/dL   Microalb/Creat Ratio <30 <30 mg/g  Bayer DCA Hb A1c Waived  Result Value Ref Range   HB A1C (BAYER DCA - WAIVED) 5.7 <7.0 %  VITAMIN D 25 Hydroxy (Vit-D Deficiency, Fractures)  Result Value Ref Range   Vit D, 25-Hydroxy 21.2 (L) 30.0 - 100.0 ng/mL      Assessment & Plan:   Problem List Items Addressed This Visit       Endocrine   IFG (impaired fasting glucose) - Primary    Rechecking labs today. Await results. Treat as needed. Would be interested in ozempic if it goes up.        Relevant Orders   Bayer DCA Hb A1c Waived   CBC with Differential/Platelet     Other   Hyperlipidemia    Rechecking labs today. Await results. Treat as needed.        Relevant Orders   CBC with Differential/Platelet   Comprehensive metabolic panel   Lipid Panel w/o Chol/HDL Ratio   Vitamin D deficiency    Rechecking labs today. Await results. Treat as needed.        Relevant Orders   CBC with Differential/Platelet   VITAMIN D 25 Hydroxy (Vit-D Deficiency, Fractures)   S/P bariatric surgery    Rechecking labs today. Await results. Treat as needed.        Relevant Orders   Vitamin K1, Serum   Magnesium   Phosphorus   Morbid obesity ([facility])    Stable. Happy with how she's doing. Continue current regimen. Call with any concerns.        Relevant Orders   CBC with Differential/Platelet     Follow up plan: Return in about 6 months (around 07/27/2021).

## 2021-01-24 NOTE — Assessment & Plan Note (Signed)
Stable. Happy with how she's doing. Continue current regimen. Call with any concerns.

## 2021-01-25 LAB — COMPREHENSIVE METABOLIC PANEL
ALT: 16 IU/L (ref 0–32)
AST: 21 IU/L (ref 0–40)
Albumin/Globulin Ratio: 1.8 (ref 1.2–2.2)
Albumin: 4.2 g/dL (ref 3.8–4.8)
Alkaline Phosphatase: 101 IU/L (ref 44–121)
BUN/Creatinine Ratio: 18 (ref 12–28)
BUN: 17 mg/dL (ref 8–27)
Bilirubin Total: 0.2 mg/dL (ref 0.0–1.2)
CO2: 22 mmol/L (ref 20–29)
Calcium: 9 mg/dL (ref 8.7–10.3)
Chloride: 104 mmol/L (ref 96–106)
Creatinine, Ser: 0.92 mg/dL (ref 0.57–1.00)
Globulin, Total: 2.4 g/dL (ref 1.5–4.5)
Glucose: 84 mg/dL (ref 65–99)
Potassium: 4.5 mmol/L (ref 3.5–5.2)
Sodium: 143 mmol/L (ref 134–144)
Total Protein: 6.6 g/dL (ref 6.0–8.5)
eGFR: 68 mL/min/{1.73_m2} (ref >59)

## 2021-01-25 LAB — CBC WITH DIFFERENTIAL/PLATELET
Basophils Absolute: 0 10*3/uL (ref 0.0–0.2)
Basos: 1 %
EOS (ABSOLUTE): 0.2 10*3/uL (ref 0.0–0.4)
Eos: 3 %
Hematocrit: 37.8 % (ref 34.0–46.6)
Hemoglobin: 12.4 g/dL (ref 11.1–15.9)
Immature Grans (Abs): 0 10*3/uL (ref 0.0–0.1)
Immature Granulocytes: 0 %
Lymphocytes Absolute: 1.3 10*3/uL (ref 0.7–3.1)
Lymphs: 30 %
MCH: 30.4 pg (ref 26.6–33.0)
MCHC: 32.8 g/dL (ref 31.5–35.7)
MCV: 93 fL (ref 79–97)
Monocytes Absolute: 0.4 10*3/uL (ref 0.1–0.9)
Monocytes: 8 %
Neutrophils Absolute: 2.6 10*3/uL (ref 1.4–7.0)
Neutrophils: 58 %
Platelets: 213 10*3/uL (ref 150–450)
RBC: 4.08 x10E6/uL (ref 3.77–5.28)
RDW: 13.6 % (ref 11.7–15.4)
WBC: 4.6 10*3/uL (ref 3.4–10.8)

## 2021-01-25 LAB — PHOSPHORUS: Phosphorus: 3.3 mg/dL (ref 3.0–4.3)

## 2021-01-25 LAB — LIPID PANEL W/O CHOL/HDL RATIO
Cholesterol, Total: 270 mg/dL — ABNORMAL HIGH (ref 100–199)
HDL: 66 mg/dL (ref >39)
LDL Chol Calc (NIH): 188 mg/dL — ABNORMAL HIGH (ref 0–99)
Triglycerides: 96 mg/dL (ref 0–149)
VLDL Cholesterol Cal: 16 mg/dL (ref 5–40)

## 2021-01-25 LAB — MAGNESIUM: Magnesium: 2.1 mg/dL (ref 1.6–2.3)

## 2021-01-25 LAB — VITAMIN D 25 HYDROXY (VIT D DEFICIENCY, FRACTURES): Vit D, 25-Hydroxy: 26.9 ng/mL — ABNORMAL LOW (ref 30.0–100.0)

## 2021-02-01 LAB — VITAMIN K1, SERUM: VITAMIN K1: 0.33 ng/mL (ref 0.10–2.20)

## 2021-03-13 IMAGING — RF DG UGI W/ HIGH DENSITY W/O KUB
15 of 21 series · 15 of 21 positions shown · non-contrast
Comparison: None.

CLINICAL DATA: Status post prior gastric bypass. Re-evaluation
prior to revision.

EXAM:
UPPER GI SERIES WITHOUT KUB
TECHNIQUE: Routine upper GI series was performed with thin/high density/water
soluble barium.
FLUOROSCOPY TIME:  Fluoroscopy Time:  0.7 minute
Radiation Exposure Index (if provided by the fluoroscopic device):
28.8 mGy
Number of Acquired Spot Images: 0

[Series 1: cp_standard · 0.25mm/px · 1 of 1 slices shown (1 of 14)]
[im 1/1]
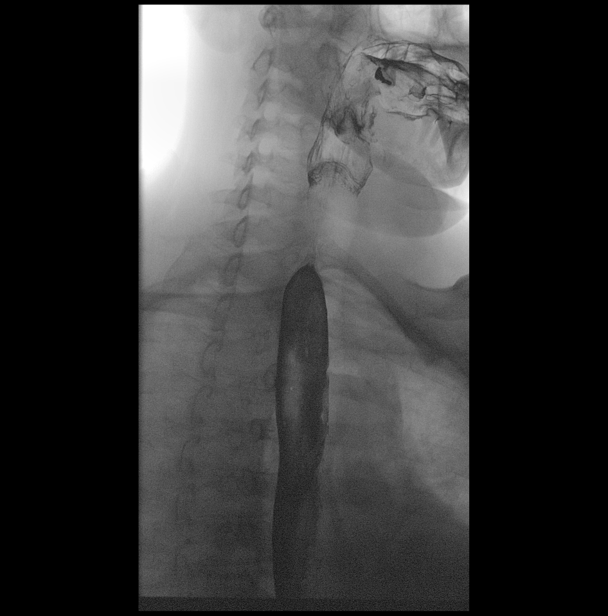

[Series 3: cp_standard · 0.25mm/px · 1 of 1 slices shown (2 of 14)]
[im 1/1]
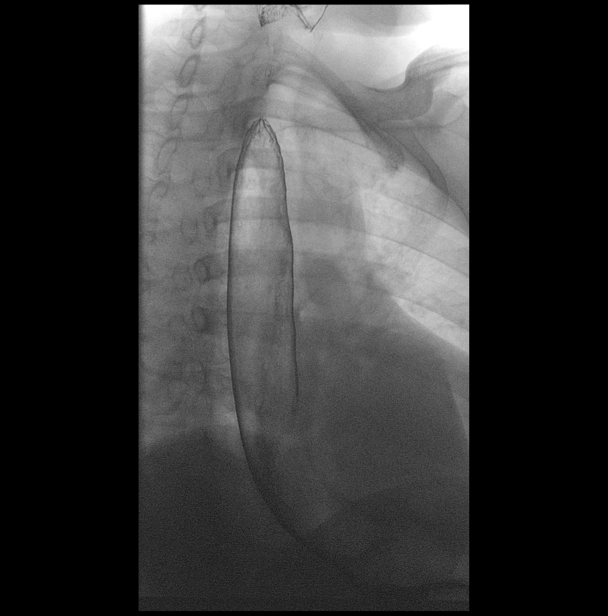

[Series 4: cp_standard · 0.25mm/px · 1 of 1 slices shown (3 of 14)]
[im 1/1]
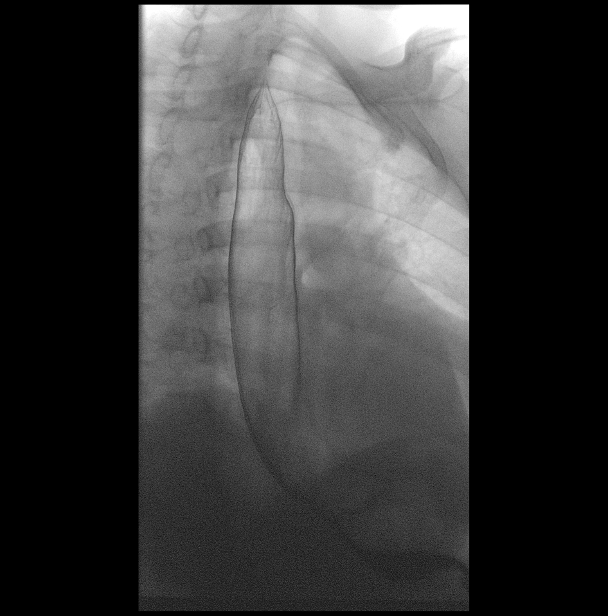

[Series 5: cp_standard · 0.25mm/px · 1 of 1 slices shown (4 of 14)]
[im 1/1]
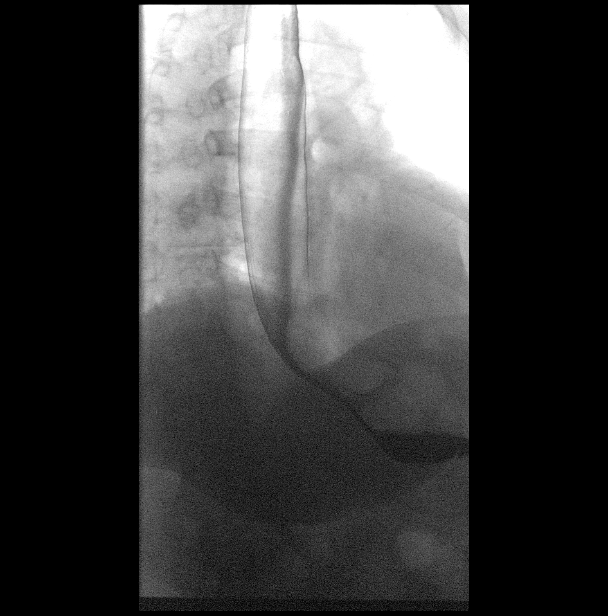

[Series 7: cp_standard · 0.25mm/px · 1 of 1 slices shown (5 of 14)]
[im 1/1]
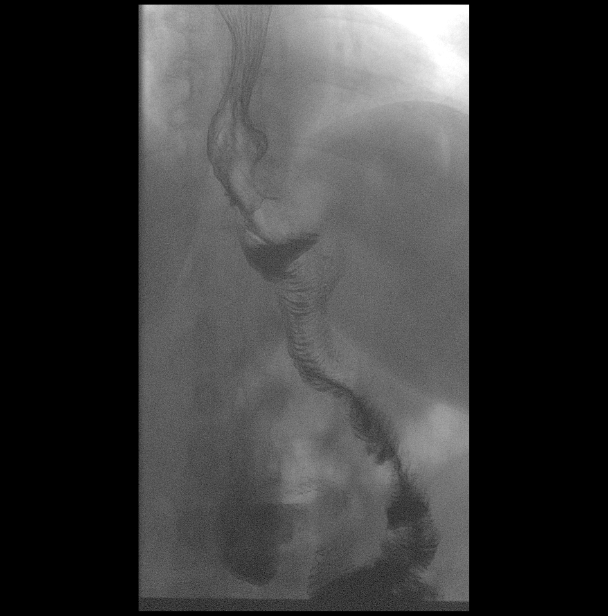

[Series 8: cp_standard · 0.25mm/px · 1 of 1 slices shown (6 of 14)]
[im 1/1]
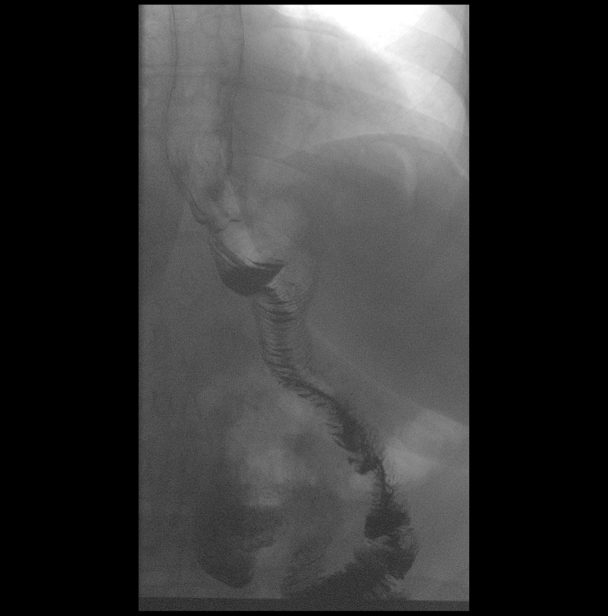

[Series 10: cp_standard · 0.18mm/px · 1 of 1 slices shown (7 of 14)]
[im 1/1]
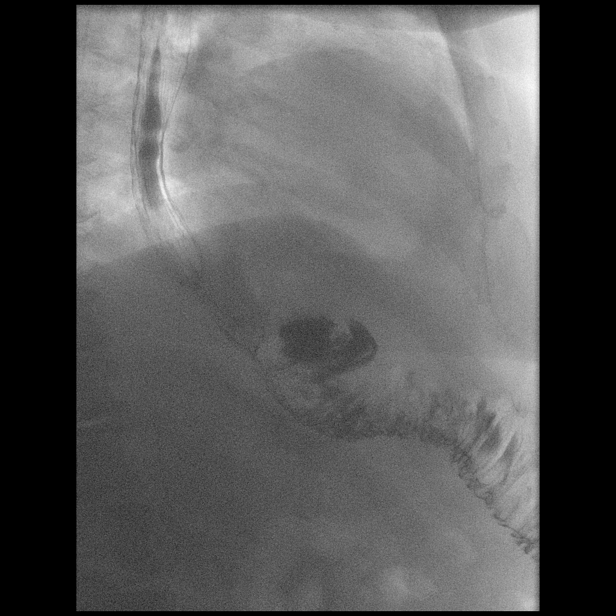

[Series 11: cp_standard · 0.18mm/px · 1 of 1 slices shown (8 of 14)]
[im 1/1]
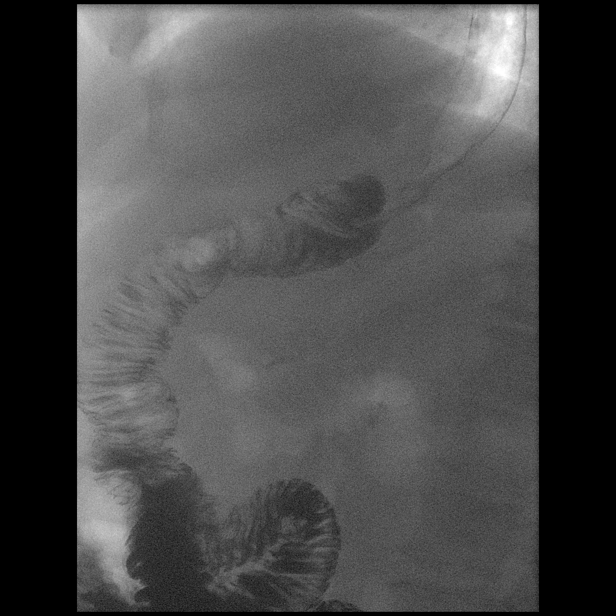

[Series 12: cp_standard · 0.18mm/px · 1 of 1 slices shown (9 of 14)]
[im 1/1]
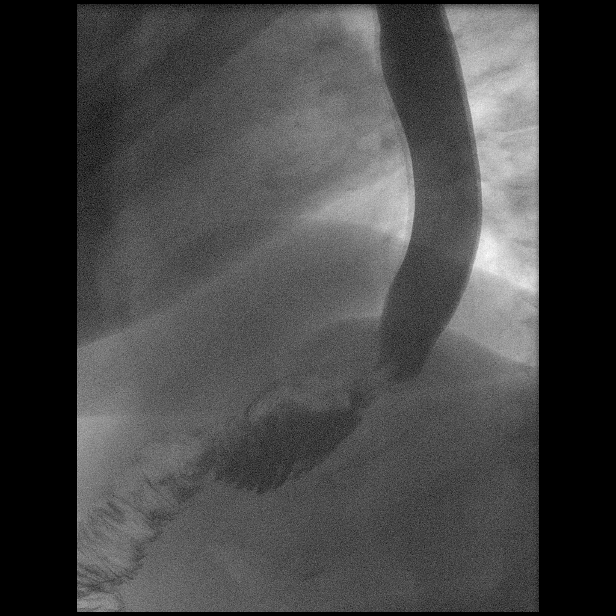

[Series 14: cp_standard · 0.18mm/px · 1 of 1 slices shown (10 of 14)]
[im 1/1]
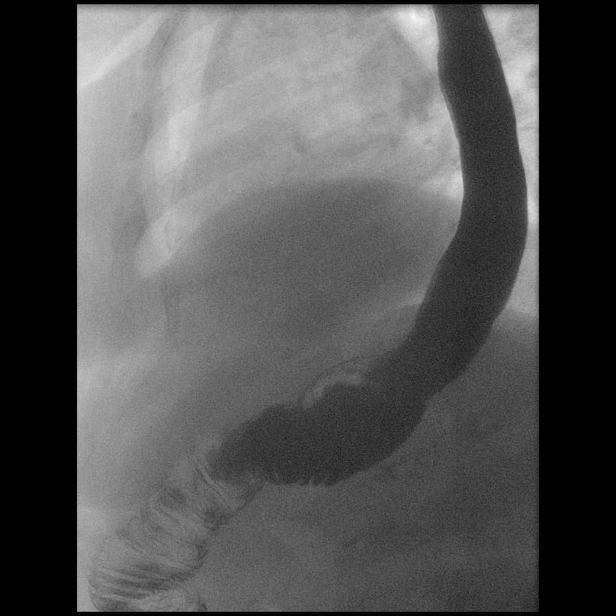

[Series 15: cp_standard · 0.18mm/px · 1 of 1 slices shown (11 of 14)]
[im 1/1]
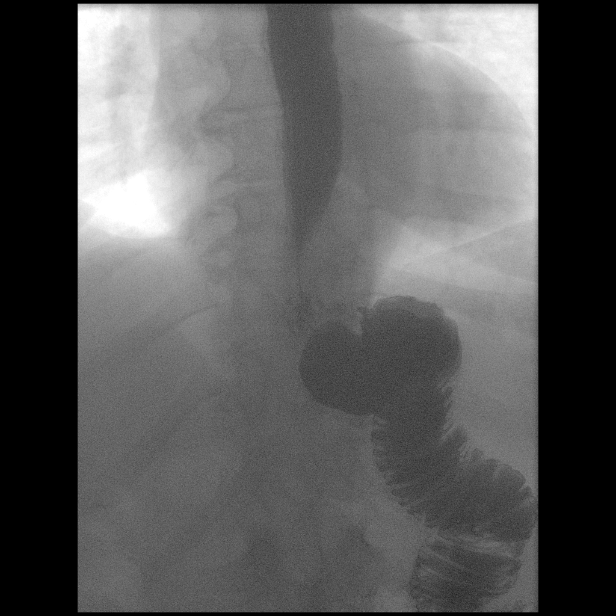

[Series 17: cp_standard · 0.18mm/px · 1 of 1 slices shown (12 of 14)]
[im 1/1]
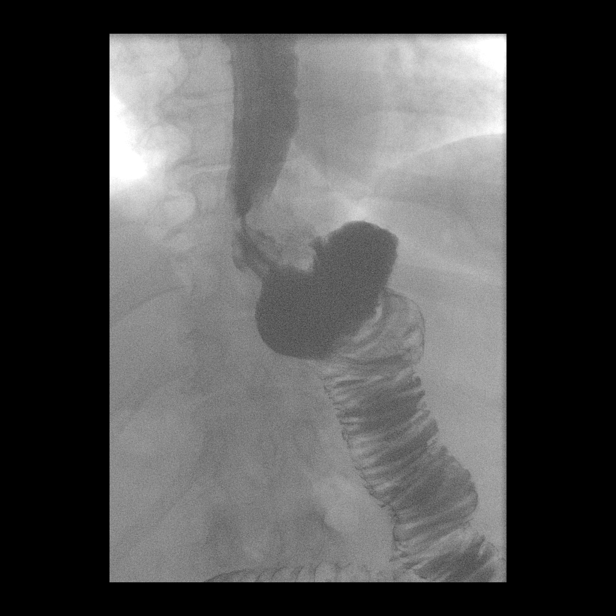

[Series 18: cp_standard · 0.27mm/px · 1 of 1 slices shown (13 of 14)]
[im 1/1]
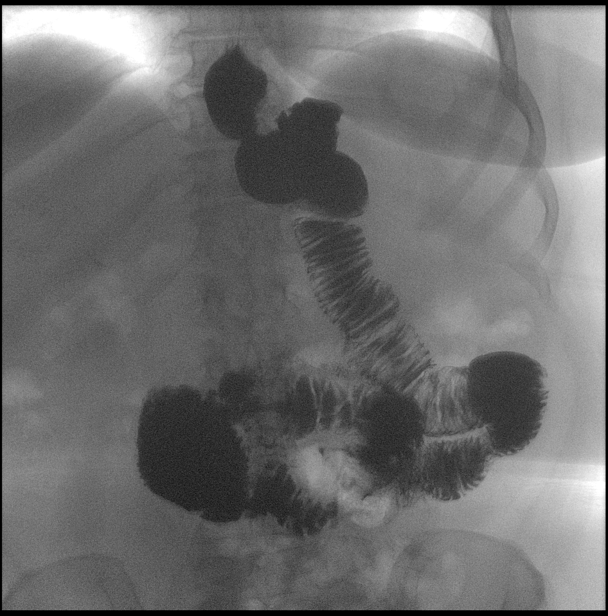

[Series 19: cp_standard · 0.28mm/px · 1 of 1 slices shown (14 of 14)]
[im 1/1]
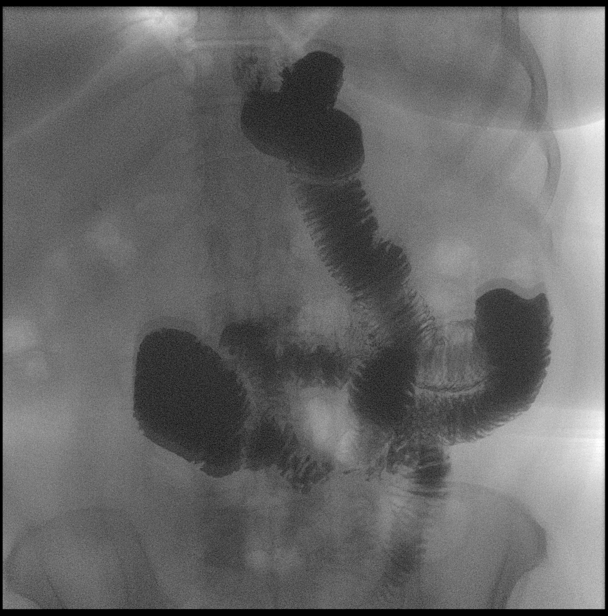

[Series 21: t abdomen barium ap · 0.15mm/px · 1 of 1 slices shown]
[im 1/1]
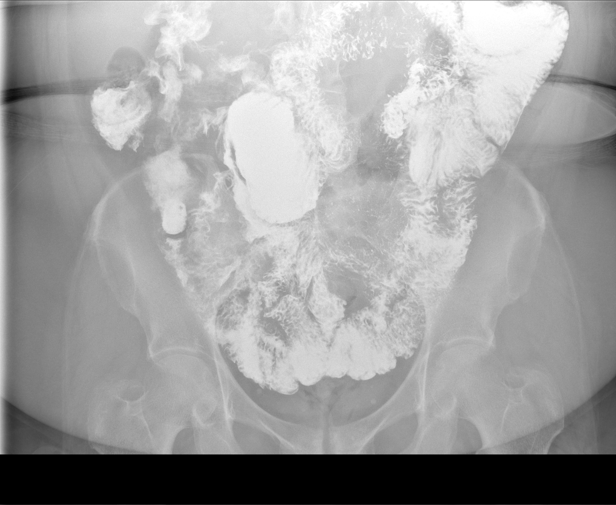

[15 of 21 positions shown; findings below may reference images not displayed]

FINDINGS: Insert

Examination of the esophagus demonstrated normal esophageal
motility. Normal esophageal morphology without evidence of
esophagitis or ulceration. No esophageal stricture, diverticula, or
mass lesion. No evidence of hiatal hernia. There is no spontaneous
or inducible gastroesophageal reflux.

Prior gastric bypass with the gastric remanent demonstrate no focal
abnormality. No ulceration, scarring, or mass lesion. Gastric
emptying was normal. Proximal small bowel demonstrates no focal
abnormality.
IMPRESSION: Prior gastric bypass without focal abnormality. No evidence of
obstruction.

## 2021-04-15 ENCOUNTER — Ambulatory Visit: Payer: Medicare HMO | Attending: Internal Medicine

## 2021-04-15 ENCOUNTER — Other Ambulatory Visit: Payer: Self-pay

## 2021-04-15 DIAGNOSIS — Z23 Encounter for immunization: Secondary | ICD-10-CM

## 2021-04-15 MED ORDER — PFIZER COVID-19 VAC BIVALENT 30 MCG/0.3ML IM SUSP
INTRAMUSCULAR | 0 refills | Status: DC
Start: 2021-04-15 — End: 2023-05-13
  Filled 2021-04-15: qty 0.3, 1d supply, fill #0

## 2021-04-15 NOTE — Progress Notes (Signed)
   Covid-19 Vaccination Clinic  Name:  Sandra Mendez    MRN: [id number] DOB: 10-09-1955  04/15/2021  [name] was observed post Covid-19 immunization for 15 minutes without incident. She was provided with Vaccine Information Sheet and instruction to access the V-Safe system.   Ms. Horn was instructed to call 911 with any severe reactions post vaccine: Difficulty breathing  Swelling of face and throat  A fast heartbeat  A bad rash all over body  Dizziness and weakness   Lu Duffel, PharmD, MBA Clinical Acute Care Pharmacist

## 2021-06-02 ENCOUNTER — Telehealth: Payer: Self-pay | Admitting: Family Medicine

## 2021-06-02 NOTE — Telephone Encounter (Signed)
Copied from [place] [id number]. Topic: Appointment Scheduling - Scheduling Inquiry for Clinic >> Jun 02, 2021  9:35 AM Sandra Mendez wrote: Reason for CRM: Pt would like to reschedule Medicare Well Visit for a Friday if possible. Pt requests call back

## 2021-06-05 NOTE — Telephone Encounter (Signed)
Appointment scheduled.

## 2021-06-27 ENCOUNTER — Other Ambulatory Visit: Payer: Self-pay | Admitting: Family Medicine

## 2021-06-27 NOTE — Telephone Encounter (Signed)
Requested medication (s) are due for refill today: yes  Requested medication (s) are on the active medication list: yes  Last refill:  01/2021  Future visit scheduled: yes  Notes to clinic:  Please review for refill. Refill not delegated per protocol.     Requested Prescriptions  Pending Prescriptions Disp Refills   cyclobenzaprine (FLEXERIL) 10 MG tablet 90 tablet 0    Sig: Take 1 tablet (10 mg total) by mouth at bedtime.     Not Delegated - Analgesics:  Muscle Relaxants Failed - 06/27/2021  3:40 PM      Failed - This refill cannot be delegated      Passed - Valid encounter within last 6 months    Recent Outpatient Visits           5 months ago IFG (impaired fasting glucose)   [facility], Megan P, DO   11 months ago Routine general medical examination at a health care facility   [facility], [place], DO   1 year ago Mixed hyperlipidemia   [place], [place], DO   1 year ago IFG (impaired fasting glucose)   [facility] [place], Megan P, DO   1 year ago Herpes zoster without complication   [place], DO       Future Appointments             In 3 days [facility]

## 2021-06-27 NOTE — Telephone Encounter (Signed)
Medication Refill - Medication: cyclobenzaprine (FLEXERIL) 10 MG tablet  Has the patient contacted their pharmacy? No. She said she has not had them in a while and lost the bottle.  Would like when she feels a leg cramp coming on. That is why dr gave to her Preferred Pharmacy (with phone number or street name): [facility] [place]  Has the patient been seen for an appointment in the last year OR does the patient have an upcoming appointment? Yes.    Agent: Please be advised that RX refills may take up to 3 business days. We ask that you follow-up with your pharmacy.

## 2021-06-30 ENCOUNTER — Ambulatory Visit (INDEPENDENT_AMBULATORY_CARE_PROVIDER_SITE_OTHER): Payer: Medicare HMO | Admitting: *Deleted

## 2021-06-30 ENCOUNTER — Encounter: Payer: Self-pay | Admitting: Family Medicine

## 2021-06-30 DIAGNOSIS — Z0000 Encounter for general adult medical examination without abnormal findings: Secondary | ICD-10-CM

## 2021-06-30 MED ORDER — CYCLOBENZAPRINE HCL 10 MG PO TABS: 10.0000 mg | ORAL_TABLET | Freq: Every day | ORAL | 0 refills | Status: DC

## 2021-06-30 NOTE — Progress Notes (Signed)
Subjective:   Sandra Mendez is a 65 y.o. female who presents for Medicare Annual (Subsequent) preventive examination.  I connected with  [name] on 06/30/21 by a telephone enabled telemedicine application and verified that I am speaking with the correct person using two identifiers.   I discussed the limitations of evaluation and management by telemedicine. The patient expressed understanding and agreed to proceed.  Patient location: home  Provider location: Tele-Health not in office    Review of Systems     Cardiac Risk Factors include: advanced age (>35men, >90 women)     Objective:    Today's Vitals   There is no height or weight on file to calculate BMI.  Advanced Directives 06/30/2021 06/28/2020 04/21/2017 02/13/2016  Does Patient Have a Medical Advance Directive? No No No No  Would patient like information on creating a medical advance directive? No - Patient declined - No - Patient declined -    Current Medications (verified) Outpatient Encounter Medications as of 06/30/2021  Medication Sig   Probiotic Product (PROBIOTIC-10 PO) Take by mouth.   UNABLE TO FIND Micro nutrient powder   UNABLE TO FIND Collagen  peptide  powder   Cholecalciferol (VITAMIN D-3) 25 MCG (1000 UT) CAPS Take by mouth.   COVID-19 mRNA bivalent vaccine, Pfizer, (PFIZER COVID-19 VAC BIVALENT) injection Inject into the muscle.   Cyanocobalamin (B-12 PO) Take by mouth.   cyclobenzaprine (FLEXERIL) 10 MG tablet Take 1 tablet (10 mg total) by mouth at bedtime. (Patient not taking: Reported on 06/30/2021)   Multiple Vitamin (ONE-DAILY MULTI-VITAMIN PO) Take by mouth.   UNABLE TO FIND Take by mouth.   UNABLE TO FIND Take by mouth.   UNABLE TO FIND Take by mouth.   [DISCONTINUED] b complex vitamins capsule Take 1 capsule by mouth daily.   [DISCONTINUED] Magnesium 250 MG TABS Take by mouth.   [DISCONTINUED] UNABLE TO FIND Take by mouth.   [DISCONTINUED] Zinc 50 MG TABS Take by mouth.   No  facility-administered encounter medications on file as of 06/30/2021.    Allergies (verified) Erythromycin, Mushroom extract complex, Other, and Penicillins   History: Past Medical History:  Diagnosis Date   History of colon polyps    Obesity    Vitamin D deficiency    Past Surgical History:  Procedure Laterality Date   BARIATRIC SURGERY  2013   CHOLECYSTECTOMY     COLONOSCOPY WITH PROPOFOL N/A 04/21/2017   Procedure: COLONOSCOPY WITH PROPOFOL;  Surgeon: Toledo, Benay Pike, MD;  Location: [facility] ENDOSCOPY;  Service: Endoscopy;  Laterality: N/A;   WISDOM TOOTH EXTRACTION     Family History  Problem Relation Age of Onset   Diabetes Mother    Stroke Father    Colon cancer Sister    Diabetes Sister    Diabetes Sister    Breast cancer Other 52   Social History   Socioeconomic History   Marital status: Married    Spouse name: Not on file   Number of children: Not on file   Years of education: Not on file   Highest education level: Not on file  Occupational History   Occupation: retired  Tobacco Use   Smoking status: Never   Smokeless tobacco: Never  Vaping Use   Vaping Use: Never used  Substance and Sexual Activity   Alcohol use: Yes    Alcohol/week: 0.0 standard drinks    Comment: no alcohol in 24hrs, drinks beer occasional   Drug use: No   Sexual activity: Not  Currently  Other Topics Concern   Not on file  Social History Narrative   Not on file   Social Determinants of Health   Financial Resource Strain: Low Risk    Difficulty of Paying Living Expenses: Not hard at all  Food Insecurity: No Food Insecurity   Worried About Charity fundraiser in the Last Year: Never true   [place] in the Last Year: Never true  Transportation Needs: No Transportation Needs   Lack of Transportation (Medical): No   Lack of Transportation (Non-Medical): No  Physical Activity: Sufficiently Active   Days of Exercise per Week: 4 days   Minutes of Exercise per Session: 40 min   Stress: No Stress Concern Present   Feeling of Stress : Not at all  Social Connections: Moderately Integrated   Frequency of Communication with Friends and Family: More than three times a week   Frequency of Social Gatherings with Friends and Family: More than three times a week   Attends Religious Services: More than 4 times per year   Active Member of [employer] or Organizations: No   Attends Music therapist: Never   Marital Status: Married    Tobacco Counseling Counseling given: Not Answered   Clinical Intake:  Pre-visit preparation completed: Yes  Pain : No/denies pain     Nutritional Risks: None Diabetes: No  How often do you need to have someone help you when you read instructions, pamphlets, or other written materials from your doctor or pharmacy?: 1 - Never  Diabetic?no  Interpreter Needed?: No  Information entered by :: Leroy Kennedy LPN   Activities of Daily Living In your present state of health, do you have any difficulty performing the following activities: 06/30/2021 07/25/2020  Hearing? N N  Vision? N N  Difficulty concentrating or making decisions? N N  Walking or climbing stairs? N N  Dressing or bathing? N N  Doing errands, shopping? N N  Preparing Food and eating ? N -  Using the Toilet? N -  In the past six months, have you accidently leaked urine? N -  Do you have problems with loss of bowel control? N -  Managing your Medications? N -  Managing your Finances? N -  Housekeeping or managing your Housekeeping? N -  Some recent data might be hidden    Patient Care Team: Valerie Roys, DO as PCP - General (Family Medicine) Ok Edwards, NP as Nurse Practitioner (Gastroenterology)  Indicate any recent Medical Services you may have received from other than [facility] providers in the past year (date may be approximate).     Assessment:   This is a routine wellness examination for Sandra Mendez.  Hearing/Vision screen Hearing  Screening - Comments:: No trouble  hearing Vision Screening - Comments:: Up to date Dr Ellin Mayhew  Dietary issues and exercise activities discussed: Current Exercise Habits: Home exercise routine, Type of exercise: walking, Time (Minutes): 40, Frequency (Times/Week): 4, Weekly Exercise (Minutes/Week): 160, Intensity: Mild   Goals Addressed             This Visit's Progress    Patient Stated   Not on track    06/28/2020, lose 20-25 pounds by June     Patient Stated       Get back to gym       Depression Screen PHQ 2/9 Scores 06/30/2021 07/25/2020 06/28/2020 06/16/2019 03/25/2018 03/19/2016  PHQ - 2 Score 0 0 0 0 0 0  PHQ- 9 Score - - - -  0 -    Fall Risk Fall Risk  06/30/2021 06/28/2020 06/16/2019 03/25/2018  Falls in the past year? 0 0 0 No  Number falls in past yr: 0 - 0 -  Injury with Fall? 0 - 0 -  Risk for fall due to : - No Fall Risks - -  Follow up Falls evaluation completed;Falls prevention discussed Falls evaluation completed;Education provided;Falls prevention discussed - -    FALL RISK PREVENTION PERTAINING TO THE HOME:  Any stairs in or around the home? No  If so, are there any without handrails? No  Home free of loose throw rugs in walkways, pet beds, electrical cords, etc? Yes  Adequate lighting in your home to reduce risk of falls? Yes   ASSISTIVE DEVICES UTILIZED TO PREVENT FALLS:  Life alert? No  Use of a cane, walker or w/c? No  Grab bars in the bathroom? Yes  Shower chair or bench in shower? No  Elevated toilet seat or a handicapped toilet? No   TIMED UP AND GO:  Was the test performed? No .    Cognitive Function:  Normal cognitive status assessed by direct observation by this Nurse Health Advisor. No abnormalities found.       6CIT Screen 06/28/2020 06/16/2019  What Year? 0 points 0 points  What month? 0 points 0 points  What time? 0 points 0 points  Count back from 20 0 points 0 points  Months in reverse 0 points 0 points  Repeat phrase 0  points 0 points  Total Score 0 0    Immunizations Immunization History  Administered Date(s) Administered   Fluad Quad(high Dose 65+) 07/25/2020, 05/28/2021   Hepatitis A, Adult 04/24/2016   Hepatitis B, adult 03/19/2016, 04/24/2016   Moderna SARS-COV2 Booster Vaccination 06/25/2020, 12/10/2020   Moderna Sars-Covid-2 Vaccination 08/31/2019, 09/28/2019   Pfizer Covid-19 Vaccine Bivalent Booster 94yrs & up 04/15/2021   Pneumococcal Conjugate-13 06/12/2020   Tdap 09/25/2010    TDAP status: Due, Education has been provided regarding the importance of this vaccine. Advised may receive this vaccine at local pharmacy or Health Dept. Aware to provide a copy of the vaccination record if obtained from local pharmacy or Health Dept. Verbalized acceptance and understanding.  Flu Vaccine status: Up to date  Pneumococcal vaccine status: Up to date  Covid-19 vaccine status: Completed vaccines  Qualifies for Shingles Vaccine? Yes   Zostavax completed No   Shingrix Completed?: No.    Education has been provided regarding the importance of this vaccine. Patient has been advised to call insurance company to determine out of pocket expense if they have not yet received this vaccine. Advised may also receive vaccine at local pharmacy or Health Dept. Verbalized acceptance and understanding.  Screening Tests Health Maintenance  Topic Date Due   Zoster Vaccines- Shingrix (1 of 2) Never done   TETANUS/TDAP  09/24/2020   Pneumonia Vaccine 51+ Years old (2 - PPSV23 if available, else PCV20) 06/12/2021   COLONOSCOPY (Pts 45-61yrs Insurance coverage will need to be confirmed)  04/21/2022   MAMMOGRAM  11/16/2022   INFLUENZA VACCINE  Completed   DEXA SCAN  Completed   COVID-19 Vaccine  Completed   Hepatitis C Screening  Completed   HPV VACCINES  Aged Out    Health Maintenance  Health Maintenance Due  Topic Date Due   Zoster Vaccines- Shingrix (1 of 2) Never done   TETANUS/TDAP  09/24/2020    Pneumonia Vaccine 75+ Years old (2 - PPSV23 if available, else PCV20) 06/12/2021  Colorectal cancer screening: Type of screening: Colonoscopy. Completed 2018. Repeat every 5 years  Mammogram status: Completed  . Repeat every year  Bone Density status: Completed 2020. Results reflect: Bone density results: NORMAL. Repeat every 0 years.  Lung Cancer Screening: (Low Dose CT Chest recommended if Age 74-80 years, 30 pack-year currently smoking OR have quit w/in 15years.) does not qualify.   Lung Cancer Screening Referral:   Additional Screening:  Hepatitis C Screening: does not qualify; Completed 2020  Vision Screening: Recommended annual ophthalmology exams for early detection of glaucoma and other disorders of the eye. Is the patient up to date with their annual eye exam?  Yes  Who is the provider or what is the name of the office in which the patient attends annual eye exams? Dr. Ellin Mayhew If pt is not established with a provider, would they like to be referred to a provider to establish care? No .   Dental Screening: Recommended annual dental exams for proper oral hygiene  Community Resource Referral / Chronic Care Management: CRR required this visit?  No   CCM required this visit?  No      Plan:     I have personally reviewed and noted the following in the patient's chart:   Medical and social history Use of alcohol, tobacco or illicit drugs  Current medications and supplements including opioid prescriptions.  Functional ability and status Nutritional status Physical activity Advanced directives List of other physicians Hospitalizations, surgeries, and ER visits in previous 12 months Vitals Screenings to include cognitive, depression, and falls Referrals and appointments  In addition, I have reviewed and discussed with patient certain preventive protocols, quality metrics, and best practice recommendations. A written personalized care plan for preventive services as  well as general preventive health recommendations were provided to patient.     Leroy Kennedy, LPN   [id number]   Nurse Notes:

## 2021-06-30 NOTE — Patient Instructions (Signed)
Sandra Mendez , Thank you for taking time to come for your Medicare Wellness Visit. I appreciate your ongoing commitment to your health goals. Please review the following plan we discussed and let me know if I can assist you in the future.   Screening recommendations/referrals: Colonoscopy: up to date Mammogram: up to date Bone Density: up to date Recommended yearly ophthalmology/optometry visit for glaucoma screening and checkup Recommended yearly dental visit for hygiene and checkup  Vaccinations: Influenza vaccine: up to date Pneumococcal vaccine: up to date Tdap vaccine: Education provided Shingles vaccine: Education provided    Advanced directives: Education provided  Conditions/risks identified:   Next appointment:    Preventive Care 71 Years and Older, Female Preventive care refers to lifestyle choices and visits with your health care provider that can promote health and wellness. What does preventive care include? A yearly physical exam. This is also called an annual well check. Dental exams once or twice a year. Routine eye exams. Ask your health care provider how often you should have your eyes checked. Personal lifestyle choices, including: Daily care of your teeth and gums. Regular physical activity. Eating a healthy diet. Avoiding tobacco and drug use. Limiting alcohol use. Practicing safe sex. Taking low-dose aspirin every day. Taking vitamin and mineral supplements as recommended by your health care provider. What happens during an annual well check? The services and screenings done by your health care provider during your annual well check will depend on your age, overall health, lifestyle risk factors, and family history of disease. Counseling  Your health care provider may ask you questions about your: Alcohol use. Tobacco use. Drug use. Emotional well-being. Home and relationship well-being. Sexual activity. Eating habits. History of falls. Memory  and ability to understand (cognition). Work and work Statistician. Reproductive health. Screening  You may have the following tests or measurements: Height, weight, and BMI. Blood pressure. Lipid and cholesterol levels. These may be checked every 5 years, or more frequently if you are over 57 years old. Skin check. Lung cancer screening. You may have this screening every year starting at age 13 if you have a 30-pack-year history of smoking and currently smoke or have quit within the past 15 years. Fecal occult blood test (FOBT) of the stool. You may have this test every year starting at age 73. Flexible sigmoidoscopy or colonoscopy. You may have a sigmoidoscopy every 5 years or a colonoscopy every 10 years starting at age 40. Hepatitis C blood test. Hepatitis B blood test. Sexually transmitted disease (STD) testing. Diabetes screening. This is done by checking your blood sugar (glucose) after you have not eaten for a while (fasting). You may have this done every 1-3 years. Bone density scan. This is done to screen for osteoporosis. You may have this done starting at age 76. Mammogram. This may be done every 1-2 years. Talk to your health care provider about how often you should have regular mammograms. Talk with your health care provider about your test results, treatment options, and if necessary, the need for more tests. Vaccines  Your health care provider may recommend certain vaccines, such as: Influenza vaccine. This is recommended every year. Tetanus, diphtheria, and acellular pertussis (Tdap, Td) vaccine. You may need a Td booster every 10 years. Zoster vaccine. You may need this after age 12. Pneumococcal 13-valent conjugate (PCV13) vaccine. One dose is recommended after age 56. Pneumococcal polysaccharide (PPSV23) vaccine. One dose is recommended after age 29. Talk to your health care provider about which screenings and  vaccines you need and how often you need them. This  information is not intended to replace advice given to you by your health care provider. Make sure you discuss any questions you have with your health care provider. Document Released: 08/09/2015 Document Revised: 04/01/2016 Document Reviewed: 05/14/2015 Elsevier Interactive Patient Education  2017 [employer] Prevention in the Home Falls can cause injuries. They can happen to people of all ages. There are many things you can do to make your home safe and to help prevent falls. What can I do on the outside of my home? Regularly fix the edges of walkways and driveways and fix any cracks. Remove anything that might make you trip as you walk through a door, such as a raised step or threshold. Trim any bushes or trees on the path to your home. Use bright outdoor lighting. Clear any walking paths of anything that might make someone trip, such as rocks or tools. Regularly check to see if handrails are loose or broken. Make sure that both sides of any steps have handrails. Any raised decks and porches should have guardrails on the edges. Have any leaves, snow, or ice cleared regularly. Use sand or salt on walking paths during winter. Clean up any spills in your garage right away. This includes oil or grease spills. What can I do in the bathroom? Use night lights. Install grab bars by the toilet and in the tub and shower. Do not use towel bars as grab bars. Use non-skid mats or decals in the tub or shower. If you need to sit down in the shower, use a plastic, non-slip stool. Keep the floor dry. Clean up any water that spills on the floor as soon as it happens. Remove soap buildup in the tub or shower regularly. Attach bath mats securely with double-sided non-slip rug tape. Do not have throw rugs and other things on the floor that can make you trip. What can I do in the bedroom? Use night lights. Make sure that you have a light by your bed that is easy to reach. Do not use any sheets or  blankets that are too big for your bed. They should not hang down onto the floor. Have a firm chair that has side arms. You can use this for support while you get dressed. Do not have throw rugs and other things on the floor that can make you trip. What can I do in the kitchen? Clean up any spills right away. Avoid walking on wet floors. Keep items that you use a lot in easy-to-reach places. If you need to reach something above you, use a strong step stool that has a grab bar. Keep electrical cords out of the way. Do not use floor polish or wax that makes floors slippery. If you must use wax, use non-skid floor wax. Do not have throw rugs and other things on the floor that can make you trip. What can I do with my stairs? Do not leave any items on the stairs. Make sure that there are handrails on both sides of the stairs and use them. Fix handrails that are broken or loose. Make sure that handrails are as long as the stairways. Check any carpeting to make sure that it is firmly attached to the stairs. Fix any carpet that is loose or worn. Avoid having throw rugs at the top or bottom of the stairs. If you do have throw rugs, attach them to the floor with carpet tape. Make  sure that you have a light switch at the top of the stairs and the bottom of the stairs. If you do not have them, ask someone to add them for you. What else can I do to help prevent falls? Wear shoes that: Do not have high heels. Have rubber bottoms. Are comfortable and fit you well. Are closed at the toe. Do not wear sandals. If you use a stepladder: Make sure that it is fully opened. Do not climb a closed stepladder. Make sure that both sides of the stepladder are locked into place. Ask someone to hold it for you, if possible. Clearly mark and make sure that you can see: Any grab bars or handrails. First and last steps. Where the edge of each step is. Use tools that help you move around (mobility aids) if they are  needed. These include: Canes. Walkers. Scooters. Crutches. Turn on the lights when you go into a dark area. Replace any light bulbs as soon as they burn out. Set up your furniture so you have a clear path. Avoid moving your furniture around. If any of your floors are uneven, fix them. If there are any pets around you, be aware of where they are. Review your medicines with your doctor. Some medicines can make you feel dizzy. This can increase your chance of falling. Ask your doctor what other things that you can do to help prevent falls. This information is not intended to replace advice given to you by your health care provider. Make sure you discuss any questions you have with your health care provider. Document Released: 05/09/2009 Document Revised: 12/19/2015 Document Reviewed: 08/17/2014 Elsevier Interactive Patient Education  2017 [employer].

## 2021-10-15 ENCOUNTER — Other Ambulatory Visit: Payer: Self-pay | Admitting: Family Medicine

## 2021-10-15 DIAGNOSIS — Z1231 Encounter for screening mammogram for malignant neoplasm of breast: Secondary | ICD-10-CM

## 2021-11-27 ENCOUNTER — Ambulatory Visit
Admission: RE | Admit: 2021-11-27 | Discharge: 2021-11-27 | Disposition: A | Discharge status: Home / Self Care | Payer: Medicare HMO | Source: Ambulatory Visit | Attending: Family Medicine | Admitting: Family Medicine

## 2021-11-27 DIAGNOSIS — Z1231 Encounter for screening mammogram for malignant neoplasm of breast: Secondary | ICD-10-CM | POA: Insufficient documentation

## 2022-06-25 ENCOUNTER — Telehealth: Payer: Self-pay

## 2022-06-25 NOTE — Telephone Encounter (Signed)
Called and lvm for patient to return call to set up appt for physical

## 2022-06-25 NOTE — Telephone Encounter (Signed)
Copied from [place] [id number]. Topic: General - Other >> Jun 25, 2022  4:03 PM Ja-Kwan M wrote: Reason for CRM: Pt reports that she moved to [place] and will not be coming in for an appt. Pt also wanted to make Dr. Wynetta Emery aware that she loss 33 pounds.

## 2023-05-07 ENCOUNTER — Ambulatory Visit: Payer: Medicare HMO | Admitting: Family Medicine

## 2023-05-13 ENCOUNTER — Ambulatory Visit: Payer: No Typology Code available for payment source | Admitting: Family Medicine

## 2023-05-13 ENCOUNTER — Ambulatory Visit
Admission: RE | Admit: 2023-05-13 | Discharge: 2023-05-13 | Disposition: A | Discharge status: Home / Self Care | Payer: No Typology Code available for payment source | Source: Ambulatory Visit | Attending: Family Medicine | Admitting: Family Medicine

## 2023-05-13 ENCOUNTER — Encounter: Payer: Self-pay | Admitting: Family Medicine

## 2023-05-13 ENCOUNTER — Telehealth: Payer: Self-pay | Admitting: Family Medicine

## 2023-05-13 ENCOUNTER — Ambulatory Visit
Admission: RE | Admit: 2023-05-13 | Discharge: 2023-05-13 | Disposition: A | Discharge status: Home / Self Care | Payer: No Typology Code available for payment source | Attending: Family Medicine | Admitting: Family Medicine

## 2023-05-13 VITALS — BP 111/76 | HR 77 | Ht 65.0 in | Wt 197.0 lb

## 2023-05-13 DIAGNOSIS — E782 Mixed hyperlipidemia: Secondary | ICD-10-CM

## 2023-05-13 DIAGNOSIS — Z9884 Bariatric surgery status: Secondary | ICD-10-CM | POA: Diagnosis not present

## 2023-05-13 DIAGNOSIS — M79604 Pain in right leg: Secondary | ICD-10-CM | POA: Diagnosis present

## 2023-05-13 DIAGNOSIS — G8929 Other chronic pain: Secondary | ICD-10-CM

## 2023-05-13 DIAGNOSIS — E559 Vitamin D deficiency, unspecified: Secondary | ICD-10-CM | POA: Diagnosis not present

## 2023-05-13 DIAGNOSIS — M25562 Pain in left knee: Secondary | ICD-10-CM

## 2023-05-13 DIAGNOSIS — Z1231 Encounter for screening mammogram for malignant neoplasm of breast: Secondary | ICD-10-CM

## 2023-05-13 DIAGNOSIS — R7301 Impaired fasting glucose: Secondary | ICD-10-CM

## 2023-05-13 DIAGNOSIS — Z1211 Encounter for screening for malignant neoplasm of colon: Secondary | ICD-10-CM

## 2023-05-13 NOTE — Progress Notes (Signed)
BP 111/76   Pulse 77   Ht 5\' 5"  (1.651 m)   Wt 197 lb (89.4 kg)   SpO2 98%   BMI 32.78 kg/m    Subjective:    Patient ID: Sandra Mendez, female    DOB: September 26, 1955, 67 y.o.   MRN: [id number]  HPI: Sandra Mendez is a 67 y.o. female who presents today to reestablish care after moving to [place] for 2 years. Has not been seeing a doctor since she left.   Chief Complaint  Patient presents with   Hyperlipidemia   Handicap Placard   Knee Pain    Patient says she is having issues with her L knee bothering her. Patient says this has been an ongoing issue.    Spasms    Patient says she is still having issues with muscle spasms, that start in her calf muscle radiates up to her thigh. Patient says the spasms are in both legs, but predominantly in the R leg.    Impaired Fasting Glucose HbA1C:  Lab Results  Component Value Date   HGBA1C 5.9 01/24/2021   Duration of elevated blood sugar: chronic Polydipsia: no Polyuria: no Weight change: yes Visual disturbance: no Glucose Monitoring: no Diabetic Education: Not Completed Family history of diabetes: yes  HYPERLIPIDEMIA Hyperlipidemia status: Stable Satisfied with current treatment?  Not on anything Past cholesterol meds: none Supplements: none Aspirin:  no The 10-year ASCVD risk score (Arnett DK, et al., 2019) is: 8.1%   Values used to calculate the score:     Age: 7 years     Sex: Female     Is Non-Hispanic African American: Yes     Diabetic: No     Tobacco smoker: No     Systolic Blood Pressure: 111 mmHg     Is BP treated: No     HDL Cholesterol: 67 mg/dL     Total Cholesterol: 196 mg/dL Chest pain:  no Coronary artery disease:  no Family history CAD:  yes Family history early CAD:  no  LEG CRAMPS Duration: within 2-3 years Pain: yes Severity: moderate  Quality:  spasms Location:  R legs Bilateral:  no Onset: sudden Frequency: intermittent Time of  day:   at random Sudden unintentional leg jerking:    no Paresthesias:   no Decreased sensation:  no Weakness:   no Status: better Treatments attempted: herbal formulation, mustard, magnesium  KNEE PAIN Duration: chronic Involved knee: right Mechanism of injury: unknown Location:anterior Onset: sudden Severity: 5/10  Quality:  aching Frequency: intermittent Radiation: no Aggravating factors: walking and stairs  Alleviating factors: nothing  Status: better Treatments attempted: rest, ice, heat, and ibuprofen  Relief with NSAIDs?:  significant Weakness with weight bearing or walking: yes Sensation of giving way: no Locking: no Popping: no Bruising: no Swelling: no Redness: no Paresthesias/decreased sensation: no Fevers: no    Active Ambulatory Problems    Diagnosis Date Noted   Hyperlipidemia 01/22/2016   Vitamin D deficiency 01/22/2016   IFG (impaired fasting glucose) 02/26/2016   S/P bariatric surgery 02/26/2016   History of colon polyps 08/20/2015   Morbid obesity (HCC) 03/19/2016   PTSD (post-traumatic stress disorder) 03/27/2016   Hepatitis C antibody test positive 01/09/2019   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   Lipoma of forearm 01/12/2019   Advance care planning 06/16/2019   Obstructive sleep apnea 06/14/2019   Resolved Ambulatory Problems    Diagnosis Date Noted   Need for immunization against viral hepatitis 01/22/2016   Overeating 01/12/2019  Past Medical History:  Diagnosis Date   Obesity    Past Surgical History:  Procedure Laterality Date   BARIATRIC SURGERY  2013   CHOLECYSTECTOMY     COLONOSCOPY WITH PROPOFOL N/A 04/21/2017   Procedure: COLONOSCOPY WITH PROPOFOL;  Surgeon: Toledo, Boykin Nearing, MD;  Location: [facility] ENDOSCOPY;  Service: Endoscopy;  Laterality: N/A;   WISDOM TOOTH EXTRACTION     Outpatient Encounter Medications as of 05/13/2023  Medication Sig Note   Cascara [place]-Nat Lax (BIOHM COLON CLEANSER PO) Take by mouth.    Cholecalciferol (VITAMIN D-3) 25 MCG (1000 UT)  CAPS Take by mouth.    Cyanocobalamin (B-12 PO) Take by mouth.    Multiple Vitamin (ONE-DAILY MULTI-VITAMIN PO) Take by mouth.    Probiotic Product (PROBIOTIC-10 PO) Take by mouth.    UNABLE TO FIND Take by mouth. 01/24/2021: Ginger root   UNABLE TO FIND Take by mouth. 01/24/2021: De Blanch    UNABLE TO FIND Collagen  peptide  powder    [DISCONTINUED] COVID-19 mRNA bivalent vaccine, Pfizer, (PFIZER COVID-19 VAC BIVALENT) injection Inject into the muscle.    [DISCONTINUED] cyclobenzaprine (FLEXERIL) 10 MG tablet Take 1 tablet (10 mg total) by mouth at bedtime. (Patient not taking: Reported on 05/13/2023)    [DISCONTINUED] UNABLE TO FIND Take by mouth. (Patient not taking: Reported on 05/13/2023) 01/24/2021: Red clover black walnut   [DISCONTINUED] UNABLE TO FIND Micro nutrient powder (Patient not taking: Reported on 05/13/2023)    No facility-administered encounter medications on file as of 05/13/2023.   Allergies  Allergen Reactions   Erythromycin Rash   Mushroom Extract Complex    Other Itching and Rash    Green peppers   Penicillins Rash   Social History   Socioeconomic History   Marital status: Married    Spouse name: Not on file   Number of children: Not on file   Years of education: Not on file   Highest education level: Not on file  Occupational History   Occupation: retired  Tobacco Use   Smoking status: Never   Smokeless tobacco: Never  Vaping Use   Vaping status: Never Used  Substance and Sexual Activity   Alcohol use: Yes    Alcohol/week: 0.0 standard drinks of alcohol    Comment: no alcohol in 24hrs, drinks beer occasional   Drug use: No   Sexual activity: Not Currently  Other Topics Concern   Not on file  Social History Narrative   Not on file   Social Determinants of Health   Financial Resource Strain: Low Risk  (06/30/2021)   Overall Financial Resource Strain (CARDIA)    Difficulty of Paying Living Expenses: Not hard at all  Food Insecurity: No Food  Insecurity (06/30/2021)   Hunger Vital Sign    Worried About Running Out of Food in the Last Year: Never true    Ran Out of Food in the Last Year: Never true  Transportation Needs: No Transportation Needs (06/30/2021)   PRAPARE - Administrator, Civil Service (Medical): No    Lack of Transportation (Non-Medical): No  Physical Activity: Sufficiently Active (06/30/2021)   Exercise Vital Sign    Days of Exercise per Week: 4 days    Minutes of Exercise per Session: 40 min  Stress: No Stress Concern Present (06/30/2021)   [employer] of Occupational Health - Occupational Stress Questionnaire    Feeling of Stress : Not at all  Social Connections: Moderately Integrated (06/30/2021)   Social Connection and Isolation Panel [NHANES]  Frequency of Communication with Friends and Family: More than three times a week    Frequency of Social Gatherings with Friends and Family: More than three times a week    Attends Religious Services: More than 4 times per year    Active Member of [employer] or Organizations: No    Attends Banker Meetings: Never    Marital Status: Married   Family History  Problem Relation Age of Onset   Diabetes Mother    Stroke Father    Diabetes Sister    Kidney disease Sister    Diabetes Sister    Colon cancer Sister    Diabetes Sister    Diabetes Sister    Diabetes Sister    Heart disease Sister    Breast cancer Other 42    Review of Systems  Constitutional: Negative.   Respiratory: Negative.    Cardiovascular: Negative.   Gastrointestinal: Negative.   Musculoskeletal:  Positive for arthralgias and myalgias. Negative for back pain, gait problem, joint swelling, neck pain and neck stiffness.  Skin: Negative.   Neurological: Negative.        Spasms  Psychiatric/Behavioral: Negative.      Per HPI unless specifically indicated above     Objective:    BP 111/76   Pulse 77   Ht 5\' 5"  (1.651 m)   Wt 197 lb (89.4 kg)   SpO2 98%   BMI  32.78 kg/m   Wt Readings from Last 3 Encounters:  05/13/23 197 lb (89.4 kg)  01/24/21 242 lb 3.2 oz (109.9 kg)  07/25/20 246 lb 3.2 oz (111.7 kg)    Physical Exam Vitals and nursing note reviewed.  Constitutional:      General: She is not in acute distress.    Appearance: Normal appearance. She is obese. She is not ill-appearing, toxic-appearing or diaphoretic.  HENT:     Head: Normocephalic and atraumatic.     Right Ear: External ear normal.     Left Ear: External ear normal.     Nose: Nose normal.     Mouth/Throat:     Mouth: Mucous membranes are moist.     Pharynx: Oropharynx is clear.  Eyes:     General: No scleral icterus.       Right eye: No discharge.        Left eye: No discharge.     Extraocular Movements: Extraocular movements intact.     Conjunctiva/sclera: Conjunctivae normal.     Pupils: Pupils are equal, round, and reactive to light.  Cardiovascular:     Rate and Rhythm: Normal rate and regular rhythm.     Pulses: Normal pulses.     Heart sounds: Normal heart sounds. No murmur heard.    No friction rub. No gallop.  Pulmonary:     Effort: Pulmonary effort is normal. No respiratory distress.     Breath sounds: Normal breath sounds. No stridor. No wheezing, rhonchi or rales.  Chest:     Chest wall: No tenderness.  Musculoskeletal:        General: Normal range of motion.     Cervical back: Normal range of motion and neck supple.  Skin:    General: Skin is warm and dry.     Capillary Refill: Capillary refill takes less than 2 seconds.     Coloration: Skin is not jaundiced or pale.     Findings: No bruising, erythema, lesion or rash.  Neurological:     General: No focal deficit present.  Mental Status: She is alert and oriented to person, place, and time. Mental status is at baseline.  Psychiatric:        Mood and Affect: Mood normal.        Behavior: Behavior normal.        Thought Content: Thought content normal.        Judgment: Judgment normal.      Results for orders placed or performed in visit on 01/24/21  Vitamin K1, Serum  Result Value Ref Range   VITAMIN K1 0.33 0.10 - 2.20 ng/mL  Bayer DCA Hb A1c Waived  Result Value Ref Range   HB A1C (BAYER DCA - WAIVED) 5.9 <7.0 %  CBC with Differential/Platelet  Result Value Ref Range   WBC 4.6 3.4 - 10.8 x10E3/uL   RBC 4.08 3.77 - 5.28 x10E6/uL   Hemoglobin 12.4 11.1 - 15.9 g/dL   Hematocrit 64.4 03.4 - 46.6 %   MCV 93 79 - 97 fL   MCH 30.4 26.6 - 33.0 pg   MCHC 32.8 31.5 - 35.7 g/dL   RDW 74.2 59.5 - 63.8 %   Platelets 213 150 - 450 x10E3/uL   Neutrophils 58 Not Estab. %   Lymphs 30 Not Estab. %   Monocytes 8 Not Estab. %   Eos 3 Not Estab. %   Basos 1 Not Estab. %   Neutrophils Absolute 2.6 1.4 - 7.0 x10E3/uL   Lymphocytes Absolute 1.3 0.7 - 3.1 x10E3/uL   Monocytes Absolute 0.4 0.1 - 0.9 x10E3/uL   EOS (ABSOLUTE) 0.2 0.0 - 0.4 x10E3/uL   Basophils Absolute 0.0 0.0 - 0.2 x10E3/uL   Immature Granulocytes 0 Not Estab. %   Immature Grans (Abs) 0.0 0.0 - 0.1 x10E3/uL  Comprehensive metabolic panel  Result Value Ref Range   Glucose 84 65 - 99 mg/dL   BUN 17 8 - 27 mg/dL   Creatinine, Ser 7.56 0.57 - 1.00 mg/dL   eGFR 68 >43 PI/RJJ/8.84   BUN/Creatinine Ratio 18 12 - 28   Sodium 143 134 - 144 mmol/L   Potassium 4.5 3.5 - 5.2 mmol/L   Chloride 104 96 - 106 mmol/L   CO2 22 20 - 29 mmol/L   Calcium 9.0 8.7 - 10.3 mg/dL   Total Protein 6.6 6.0 - 8.5 g/dL   Albumin 4.2 3.8 - 4.8 g/dL   Globulin, Total 2.4 1.5 - 4.5 g/dL   Albumin/Globulin Ratio 1.8 1.2 - 2.2   Bilirubin Total 0.2 0.0 - 1.2 mg/dL   Alkaline Phosphatase 101 44 - 121 IU/L   AST 21 0 - 40 IU/L   ALT 16 0 - 32 IU/L  Lipid Panel w/o Chol/HDL Ratio  Result Value Ref Range   Cholesterol, Total 270 (H) 100 - 199 mg/dL   Triglycerides 96 0 - 149 mg/dL   HDL 66 >16 mg/dL   VLDL Cholesterol Cal 16 5 - 40 mg/dL   LDL Chol Calc (NIH) 606 (H) 0 - 99 mg/dL  VITAMIN D 25 Hydroxy (Vit-D Deficiency, Fractures)   Result Value Ref Range   Vit D, 25-Hydroxy 26.9 (L) 30.0 - 100.0 ng/mL  Magnesium  Result Value Ref Range   Magnesium 2.1 1.6 - 2.3 mg/dL  Phosphorus  Result Value Ref Range   Phosphorus 3.3 3.0 - 4.3 mg/dL      Assessment & Plan:   Problem List Items Addressed This Visit       Endocrine   IFG (impaired fasting glucose)    Rechecking labs today.  Wait results. Treat as needed.       Relevant Orders   CBC with Differential/Platelet   Comprehensive metabolic panel   TSH   Hgb A1c w/o eAG     Other   Hyperlipidemia - Primary    Rechecking labs today. Wait results. Treat as needed.       Relevant Orders   CBC with Differential/Platelet   Comprehensive metabolic panel   Lipid Panel w/o Chol/HDL Ratio   TSH   Vitamin D deficiency    Checking labs today. Await results. Treat as needed.       Relevant Orders   VITAMIN D 25 Hydroxy (Vit-D Deficiency, Fractures)   S/P bariatric surgery    Checking labs today. Await results. Treat as needed.       Relevant Orders   B12   Iron Binding Cap (TIBC)([facility])   Ferritin   Morbid obesity (HCC)    Congratulated patient on 50lb weight loss! Continue diet and exercise. Call with any concerns. Continue to monitor.       Disorder of iron metabolism    Will check labs. Await results. Treat as needed.       Relevant Orders   Iron Binding Cap (TIBC)([facility])   Ferritin   Other Visit Diagnoses     Chronic pain of left knee       Will check x-rays. Await results. Treat as needed.   Relevant Orders   DG Knee Complete 4 Views Left   Right leg pain       Will check labs and lumbar x-ray. Await results. Treat as needed.   Relevant Orders   DG Lumbar Spine Complete   Screening for colon cancer       Referral to GI placed today.   Relevant Orders   Ambulatory referral to Gastroenterology   Encounter for screening mammogram for malignant neoplasm of breast       Mammogram ordered today.   Relevant Orders    MM 3D SCREENING MAMMOGRAM BILATERAL BREAST        Follow up plan: Return as able.

## 2023-05-13 NOTE — Patient Instructions (Signed)
Patient is scheduled for Screening Mammogram at [facility] at [facility] location on November 25th at 8:40 AM. If patient needs to change or reschedule scheduled appointment. Patient would have to reach out to their facility directly at [phone].

## 2023-05-13 NOTE — Assessment & Plan Note (Signed)
Checking labs today. Await results. Treat as needed.

## 2023-05-13 NOTE — Assessment & Plan Note (Signed)
Will check labs. Await results. Treat as needed.

## 2023-05-13 NOTE — Assessment & Plan Note (Signed)
Checking labs today. Await results. Treat as needed.  

## 2023-05-13 NOTE — Assessment & Plan Note (Signed)
Rechecking labs today. Wait results. Treat as needed.  ? ?

## 2023-05-13 NOTE — Assessment & Plan Note (Signed)
Congratulated patient on 50lb weight loss! Continue diet and exercise. Call with any concerns. Continue to monitor.

## 2023-05-13 NOTE — Telephone Encounter (Signed)
Patient dropped off Disability Parking Placard at the front desk to be completed by Dr. Laural Benes. Informed patient to allow provider 48-72 hours to complete/review. I am placing form in providers folder.

## 2023-05-18 LAB — CBC WITH DIFFERENTIAL/PLATELET
Basophils Absolute: 0 10*3/uL (ref 0.0–0.2)
Basos: 1 %
EOS (ABSOLUTE): 0.1 10*3/uL (ref 0.0–0.4)
Eos: 2 %
Hematocrit: 42.8 % (ref 34.0–46.6)
Hemoglobin: 13.8 g/dL (ref 11.1–15.9)
Immature Grans (Abs): 0 10*3/uL (ref 0.0–0.1)
Immature Granulocytes: 0 %
Lymphocytes Absolute: 1.2 10*3/uL (ref 0.7–3.1)
Lymphs: 31 %
MCH: 30.9 pg (ref 26.6–33.0)
MCHC: 32.2 g/dL (ref 31.5–35.7)
MCV: 96 fL (ref 79–97)
Monocytes Absolute: 0.3 10*3/uL (ref 0.1–0.9)
Monocytes: 9 %
Neutrophils Absolute: 2.2 10*3/uL (ref 1.4–7.0)
Neutrophils: 57 %
Platelets: 200 10*3/uL (ref 150–450)
RBC: 4.47 x10E6/uL (ref 3.77–5.28)
RDW: 12.9 % (ref 11.7–15.4)
WBC: 3.8 10*3/uL (ref 3.4–10.8)

## 2023-05-18 LAB — COMPREHENSIVE METABOLIC PANEL
ALT: 20 [IU]/L (ref 0–32)
AST: 27 [IU]/L (ref 0–40)
Albumin: 4.6 g/dL (ref 3.9–4.9)
Alkaline Phosphatase: 101 [IU]/L (ref 44–121)
BUN/Creatinine Ratio: 6 — ABNORMAL LOW (ref 12–28)
BUN: 6 mg/dL — ABNORMAL LOW (ref 8–27)
Bilirubin Total: 0.3 mg/dL (ref 0.0–1.2)
CO2: 24 mmol/L (ref 20–29)
Calcium: 9.7 mg/dL (ref 8.7–10.3)
Chloride: 102 mmol/L (ref 96–106)
Creatinine, Ser: 1.07 mg/dL — ABNORMAL HIGH (ref 0.57–1.00)
Globulin, Total: 2.5 g/dL (ref 1.5–4.5)
Glucose: 90 mg/dL (ref 70–99)
Potassium: 4 mmol/L (ref 3.5–5.2)
Sodium: 142 mmol/L (ref 134–144)
Total Protein: 7.1 g/dL (ref 6.0–8.5)
eGFR: 56 mL/min/{1.73_m2} — ABNORMAL LOW (ref >59)

## 2023-05-18 LAB — FERRITIN: Ferritin: 68 ng/mL (ref 15–150)

## 2023-05-18 LAB — IRON AND TIBC
Iron Saturation: 37 % (ref 15–55)
Iron: 124 ug/dL (ref 27–139)
Total Iron Binding Capacity: 336 ug/dL (ref 250–450)
UIBC: 212 ug/dL (ref 118–369)

## 2023-05-18 LAB — LIPID PANEL W/O CHOL/HDL RATIO
Cholesterol, Total: 271 mg/dL — ABNORMAL HIGH (ref 100–199)
HDL: 70 mg/dL (ref >39)
LDL Chol Calc (NIH): 186 mg/dL — ABNORMAL HIGH (ref 0–99)
Triglycerides: 87 mg/dL (ref 0–149)
VLDL Cholesterol Cal: 15 mg/dL (ref 5–40)

## 2023-05-18 LAB — HGB A1C W/O EAG: Hgb A1c MFr Bld: 5.9 % — ABNORMAL HIGH (ref 4.8–5.6)

## 2023-05-18 LAB — TSH: TSH: 2.07 u[IU]/mL (ref 0.450–4.500)

## 2023-05-18 LAB — VITAMIN D 25 HYDROXY (VIT D DEFICIENCY, FRACTURES): Vit D, 25-Hydroxy: 57.1 ng/mL (ref 30.0–100.0)

## 2023-05-18 LAB — VITAMIN B12: Vitamin B-12: 559 pg/mL (ref 232–1245)

## 2023-07-07 ENCOUNTER — Ambulatory Visit
Admission: RE | Admit: 2023-07-07 | Discharge: 2023-07-07 | Disposition: A | Discharge status: Home / Self Care | Payer: Medicare HMO | Source: Ambulatory Visit | Attending: Family Medicine | Admitting: Family Medicine

## 2023-07-07 DIAGNOSIS — Z1231 Encounter for screening mammogram for malignant neoplasm of breast: Secondary | ICD-10-CM

## 2023-07-09 ENCOUNTER — Encounter: Payer: Self-pay | Admitting: Family Medicine

## 2023-07-27 ENCOUNTER — Ambulatory Visit: Payer: Self-pay

## 2023-07-27 NOTE — Telephone Encounter (Signed)
 Summary: medication request   Patient called stated she is experiencing muscle spasms in both her thighs and is requesting a medication for the spasms. Please f/u with patient     Called pt lmom to return our call.
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
## 2023-07-27 NOTE — Telephone Encounter (Signed)
 Summary: medication request   Patient called stated she is experiencing muscle spasms in both her thighs and is requesting a medication for the spasms. Please f/u with patient    Called pt - left message on machine to return our call.

## 2023-08-03 MED ORDER — CYCLOBENZAPRINE HCL 10 MG PO TABS: 10.0000 mg | ORAL_TABLET | Freq: Every day | ORAL | 0 refills | Status: DC

## 2023-08-03 NOTE — Addendum Note (Signed)
 Addended by: Dorcas Carrow on: 08/03/2023 12:29 PM   Modules accepted: Orders

## 2023-08-13 ENCOUNTER — Ambulatory Visit: Payer: Medicare HMO | Admitting: Family Medicine

## 2023-08-13 ENCOUNTER — Encounter: Payer: Self-pay | Admitting: Family Medicine

## 2023-08-13 VITALS — BP 117/72 | HR 65 | Temp 97.8°F | Wt 207.2 lb

## 2023-08-13 DIAGNOSIS — Z23 Encounter for immunization: Secondary | ICD-10-CM | POA: Diagnosis not present

## 2023-08-13 DIAGNOSIS — Z0000 Encounter for general adult medical examination without abnormal findings: Secondary | ICD-10-CM

## 2023-08-13 DIAGNOSIS — Z1211 Encounter for screening for malignant neoplasm of colon: Secondary | ICD-10-CM

## 2023-08-13 NOTE — Patient Instructions (Signed)
Preventative Services:  Health Risk Assessment and Personalized Prevention Plan: Done today Bone Mass Measurements: up to date Breast Cancer Screening: up to date CVD Screening: up to date Cervical Cancer Screening: N/A Colon Cancer Screening: Ordered today Depression Screening: Done today Diabetes Screening: Up to date Glaucoma Screening: see your eye doctor Hepatitis B vaccine: N/A Hepatitis C screening: up to date HIV Screening: up to date Flu Vaccine: Declined Lung cancer Screening: N/A Obesity Screening: Done today Pneumonia Vaccines: given today STI Screening: N/A

## 2023-08-13 NOTE — Progress Notes (Signed)
BP 117/72   Pulse 65   Temp 97.8 F (36.6 C)   Wt 207 lb 3.2 oz (94 kg)   SpO2 98%   BMI 34.48 kg/m    Subjective:    Patient ID: Sandra Mendez, female    DOB: [birth year], 68 y.o.   MRN: [id number]  HPI: Sandra Mendez is a 68 y.o. female presenting on 08/13/2023 for comprehensive medical examination. Current medical complaints include:  Has been having really tight legs and sore joints. Otherwise doing well.   She currently lives with: alone Menopausal Symptoms: no  Functional Status Survey: Is the patient deaf or have difficulty hearing?: No Does the patient have difficulty seeing, even when wearing glasses/contacts?: No Does the patient have difficulty concentrating, remembering, or making decisions?: No Does the patient have difficulty walking or climbing stairs?: Yes Does the patient have difficulty dressing or bathing?: No Does the patient have difficulty doing errands alone such as visiting a doctor's office or shopping?: No     08/13/2023    8:07 AM 05/13/2023   10:36 AM 06/30/2021    8:36 AM 06/28/2020    8:19 AM 06/16/2019    8:04 AM  Fall Risk   Falls in the past year? 0 0 0 0 0  Number falls in past yr: 0 0 0  0  Injury with Fall? 0 0 0  0  Risk for fall due to : No Fall Risks No Fall Risks  No Fall Risks   Follow up Falls evaluation completed Falls evaluation completed Falls evaluation completed;Falls prevention discussed Falls evaluation completed;Education provided;Falls prevention discussed     Depression Screen    08/13/2023    8:07 AM 05/13/2023   10:36 AM 06/30/2021    8:47 AM 07/25/2020    8:09 AM 06/28/2020    8:19 AM  Depression screen PHQ 2/9  Decreased Interest 0 0 0 0 0  Down, Depressed, Hopeless 0 0 0 0 0  PHQ - 2 Score 0 0 0 0 0  Altered sleeping 1 0     Tired, decreased energy 1 0     Change in appetite 0 0     Feeling bad or failure about yourself  0 0     Trouble concentrating 0 0     Moving slowly or fidgety/restless 0 0      Suicidal thoughts 0 0     PHQ-9 Score 2 0     Difficult doing work/chores Somewhat difficult Not difficult at all      Advanced Directives Does patient have a HCPOA?    no If yes, name and contact information:  Does patient have a living will or MOST form?  no  Past Medical History:  Past Medical History:  Diagnosis Date   History of colon polyps    Obesity    Vitamin D deficiency     Surgical History:  Past Surgical History:  Procedure Laterality Date   BARIATRIC SURGERY  2013   CHOLECYSTECTOMY     COLONOSCOPY WITH PROPOFOL N/A 04/21/2017   Procedure: COLONOSCOPY WITH PROPOFOL;  Surgeon: Toledo, Boykin Nearing, MD;  Location: [facility] ENDOSCOPY;  Service: Endoscopy;  Laterality: N/A;   WISDOM TOOTH EXTRACTION      Medications:  Current Outpatient Medications on File Prior to Visit  Medication Sig   Casc[place]ax (BIOHM COLON CLEANSER PO) Take by mouth.   Cholecalciferol (VITAMIN D-3) 25 MCG (1000 UT) CAPS Take by mouth.   Cyanocobalamin (B-12 PO) Take  by mouth.   Multiple Vitamin (ONE-DAILY MULTI-VITAMIN PO) Take by mouth.   Probiotic Product (PROBIOTIC-10 PO) Take by mouth.   UNABLE TO FIND Take by mouth.   UNABLE TO FIND Take by mouth.   UNABLE TO FIND Collagen  peptide  powder   cyclobenzaprine (FLEXERIL) 10 MG tablet Take 1 tablet (10 mg total) by mouth at bedtime. (Patient not taking: Reported on 08/13/2023)   No current facility-administered medications on file prior to visit.    Allergies:  Allergies  Allergen Reactions   Erythromycin Rash   Mushroom Extract Complex (Do Not Select)    Other Itching and Rash    Green peppers   Penicillins Rash    Social History:  Social History   Socioeconomic History   Marital status: Married    Spouse name: Not on file   Number of children: Not on file   Years of education: Not on file   Highest education level: Some college, no degree  Occupational History   Occupation: retired  Tobacco Use   Smoking  status: Never   Smokeless tobacco: Never  Vaping Use   Vaping status: Never Used  Substance and Sexual Activity   Alcohol use: Yes    Alcohol/week: 0.0 standard drinks of alcohol    Comment: no alcohol in 24hrs, drinks beer occasional   Drug use: No   Sexual activity: Not Currently  Other Topics Concern   Not on file  Social History Narrative   Not on file   Social Drivers of Health   Financial Resource Strain: Low Risk  (08/09/2023)   Overall Financial Resource Strain (CARDIA)    Difficulty of Paying Living Expenses: Not hard at all  Food Insecurity: No Food Insecurity (08/09/2023)   Hunger Vital Sign    Worried About Running Out of Food in the Last Year: Never true    Ran Out of Food in the Last Year: Never true  Transportation Needs: No Transportation Needs (08/09/2023)   PRAPARE - Administrator, Civil Service (Medical): No    Lack of Transportation (Non-Medical): No  Physical Activity: Sufficiently Active (08/09/2023)   Exercise Vital Sign    Days of Exercise per Week: 4 days    Minutes of Exercise per Session: 60 min  Stress: No Stress Concern Present (08/09/2023)   [employer] of Occupational Health - Occupational Stress Questionnaire    Feeling of Stress : Only a little  Social Connections: Moderately Integrated (08/09/2023)   Social Connection and Isolation Panel [NHANES]    Frequency of Communication with Friends and Family: More than three times a week    Frequency of Social Gatherings with Friends and Family: More than three times a week    Attends Religious Services: More than 4 times per year    Active Member of [employer] or Organizations: Yes    Attends Banker Meetings: More than 4 times per year    Marital Status: Widowed  Intimate Partner Violence: Not At Risk (06/30/2021)   Humiliation, Afraid, Rape, and Kick questionnaire    Fear of Current or Ex-Partner: No    Emotionally Abused: No    Physically Abused: No    Sexually Abused: No    Social History   Tobacco Use  Smoking Status Never  Smokeless Tobacco Never   Social History   Substance and Sexual Activity  Alcohol Use Yes   Alcohol/week: 0.0 standard drinks of alcohol   Comment: no alcohol in 24hrs, drinks beer occasional  Family History:  Family History  Problem Relation Age of Onset   Diabetes Mother    Stroke Father    Diabetes Sister    Kidney disease Sister    Diabetes Sister    Colon cancer Sister    Diabetes Sister    Diabetes Sister    Diabetes Sister    Heart disease Sister    Breast cancer Other 17    Past medical history, surgical history, medications, allergies, family history and social history reviewed with patient today and changes made to appropriate areas of the chart.   Review of Systems  Constitutional: Negative.   HENT: Negative.    Eyes:  Positive for blurred vision. Negative for double vision, photophobia, pain, discharge and redness.  Respiratory: Negative.    Cardiovascular: Negative.   Gastrointestinal: Negative.   Genitourinary: Negative.   Musculoskeletal:  Positive for joint pain and myalgias. Negative for back pain, falls and neck pain.  Skin: Negative.   Neurological: Negative.   Endo/Heme/Allergies:  Positive for environmental allergies. Negative for polydipsia. Does not bruise/bleed easily.  Psychiatric/Behavioral: Negative.      All other ROS negative except what is listed above and in the HPI.      Objective:    BP 117/72   Pulse 65   Temp 97.8 F (36.6 C)   Wt 207 lb 3.2 oz (94 kg)   SpO2 98%   BMI 34.48 kg/m   Wt Readings from Last 3 Encounters:  08/13/23 207 lb 3.2 oz (94 kg)  05/13/23 197 lb (89.4 kg)  01/24/21 242 lb 3.2 oz (109.9 kg)     Physical Exam Vitals and nursing note reviewed.  Constitutional:      General: She is not in acute distress.    Appearance: Normal appearance. She is obese. She is not ill-appearing, toxic-appearing or diaphoretic.  HENT:     Head: Normocephalic  and atraumatic.     Right Ear: Tympanic membrane, ear canal and external ear normal. There is impacted cerumen.     Left Ear: Tympanic membrane, ear canal and external ear normal. There is no impacted cerumen.     Nose: Nose normal. No congestion or rhinorrhea.     Mouth/Throat:     Mouth: Mucous membranes are moist.     Pharynx: Oropharynx is clear. No oropharyngeal exudate or posterior oropharyngeal erythema.  Eyes:     General: No scleral icterus.       Right eye: No discharge.        Left eye: No discharge.     Extraocular Movements: Extraocular movements intact.     Conjunctiva/sclera: Conjunctivae normal.     Pupils: Pupils are equal, round, and reactive to light.  Neck:     Vascular: No carotid bruit.  Cardiovascular:     Rate and Rhythm: Normal rate and regular rhythm.     Pulses: Normal pulses.     Heart sounds: No murmur heard.    No friction rub. No gallop.  Pulmonary:     Effort: Pulmonary effort is normal. No respiratory distress.     Breath sounds: Normal breath sounds. No stridor. No wheezing, rhonchi or rales.  Chest:     Chest wall: No tenderness.  Abdominal:     General: Abdomen is flat. Bowel sounds are normal. There is no distension.     Palpations: Abdomen is soft. There is no mass.     Tenderness: There is no abdominal tenderness. There is no right CVA tenderness, left CVA  tenderness, guarding or rebound.     Hernia: No hernia is present.  Genitourinary:    Comments: Breast and pelvic exams deferred with shared decision making Musculoskeletal:        General: No swelling, tenderness, deformity or signs of injury.     Cervical back: Normal range of motion and neck supple. No rigidity. No muscular tenderness.     Right lower leg: No edema.     Left lower leg: No edema.  Lymphadenopathy:     Cervical: No cervical adenopathy.  Skin:    General: Skin is warm and dry.     Capillary Refill: Capillary refill takes less than 2 seconds.     Coloration: Skin is  not jaundiced or pale.     Findings: No bruising, erythema, lesion or rash.  Neurological:     General: No focal deficit present.     Mental Status: She is alert and oriented to person, place, and time. Mental status is at baseline.     Cranial Nerves: No cranial nerve deficit.     Sensory: No sensory deficit.     Motor: No weakness.     Coordination: Coordination normal.     Gait: Gait normal.     Deep Tendon Reflexes: Reflexes normal.  Psychiatric:        Mood and Affect: Mood normal.        Behavior: Behavior normal.        Thought Content: Thought content normal.        Judgment: Judgment normal.        08/13/2023    8:24 AM 06/28/2020    8:20 AM 06/16/2019    8:51 AM  6CIT Screen  What Year? 0 points 0 points 0 points  What month? 0 points 0 points 0 points  What time? 0 points 0 points 0 points  Count back from 20 0 points 0 points 0 points  Months in reverse 0 points 0 points 0 points  Repeat phrase 2 points 0 points 0 points  Total Score 2 points 0 points 0 points    Results for orders placed or performed in visit on 05/13/23  CBC with Differential/Platelet   Collection Time: 05/13/23 11:06 AM  Result Value Ref Range   WBC 3.8 3.4 - 10.8 x10E3/uL   RBC 4.47 3.77 - 5.28 x10E6/uL   Hemoglobin 13.8 11.1 - 15.9 g/dL   Hematocrit 65.7 84.6 - 46.6 %   MCV 96 79 - 97 fL   MCH 30.9 26.6 - 33.0 pg   MCHC 32.2 31.5 - 35.7 g/dL   RDW 96.2 95.2 - 84.1 %   Platelets 200 150 - 450 x10E3/uL   Neutrophils 57 Not Estab. %   Lymphs 31 Not Estab. %   Monocytes 9 Not Estab. %   Eos 2 Not Estab. %   Basos 1 Not Estab. %   Neutrophils Absolute 2.2 1.4 - 7.0 x10E3/uL   Lymphocytes Absolute 1.2 0.7 - 3.1 x10E3/uL   Monocytes Absolute 0.3 0.1 - 0.9 x10E3/uL   EOS (ABSOLUTE) 0.1 0.0 - 0.4 x10E3/uL   Basophils Absolute 0.0 0.0 - 0.2 x10E3/uL   Immature Granulocytes 0 Not Estab. %   Immature Grans (Abs) 0.0 0.0 - 0.1 x10E3/uL  Comprehensive metabolic panel   Collection Time:  05/13/23 11:06 AM  Result Value Ref Range   Glucose 90 70 - 99 mg/dL   BUN 6 (L) 8 - 27 mg/dL   Creatinine, Ser 3.24 (H) 0.57 - 1.00  mg/dL   eGFR 56 (L) >40 JW/JXB/1.47   BUN/Creatinine Ratio 6 (L) 12 - 28   Sodium 142 134 - 144 mmol/L   Potassium 4.0 3.5 - 5.2 mmol/L   Chloride 102 96 - 106 mmol/L   CO2 24 20 - 29 mmol/L   Calcium 9.7 8.7 - 10.3 mg/dL   Total Protein 7.1 6.0 - 8.5 g/dL   Albumin 4.6 3.9 - 4.9 g/dL   Globulin, Total 2.5 1.5 - 4.5 g/dL   Bilirubin Total 0.3 0.0 - 1.2 mg/dL   Alkaline Phosphatase 101 44 - 121 IU/L   AST 27 0 - 40 IU/L   ALT 20 0 - 32 IU/L  Lipid Panel w/o Chol/HDL Ratio   Collection Time: 05/13/23 11:06 AM  Result Value Ref Range   Cholesterol, Total 271 (H) 100 - 199 mg/dL   Triglycerides 87 0 - 149 mg/dL   HDL 70 >82 mg/dL   VLDL Cholesterol Cal 15 5 - 40 mg/dL   LDL Chol Calc (NIH) 956 (H) 0 - 99 mg/dL  TSH   Collection Time: 05/13/23 11:06 AM  Result Value Ref Range   TSH 2.070 0.450 - 4.500 uIU/mL  Hgb A1c w/o eAG   Collection Time: 05/13/23 11:06 AM  Result Value Ref Range   Hgb A1c MFr Bld 5.9 (H) 4.8 - 5.6 %  VITAMIN D 25 Hydroxy (Vit-D Deficiency, Fractures)   Collection Time: 05/13/23 11:06 AM  Result Value Ref Range   Vit D, 25-Hydroxy 57.1 30.0 - 100.0 ng/mL  B12   Collection Time: 05/13/23 11:06 AM  Result Value Ref Range   Vitamin B-12 559 232 - 1,245 pg/mL  Iron Binding Cap (TIBC)([facility])   Collection Time: 05/13/23 11:06 AM  Result Value Ref Range   Total Iron Binding Capacity 336 250 - 450 ug/dL   UIBC 213 086 - 578 ug/dL   Iron 469 27 - 629 ug/dL   Iron Saturation 37 15 - 55 %  Ferritin   Collection Time: 05/13/23 11:06 AM  Result Value Ref Range   Ferritin 68 15 - 150 ng/mL      Assessment & Plan:   Problem List Items Addressed This Visit   None Visit Diagnoses       Encounter for annual wellness exam in Medicare patient    -  Primary   Preventative care discussed today as below.     Routine  general medical examination at a health care facility       Vaccines up to date. Screening labs checked today. Mammo and DEXA up to date. Colonoscopy ordered. Continue diet and exercise. Continue to monitor.     Need for vaccination for pneumococcus       Prevnar given today.   Relevant Orders   Pneumococcal conjugate vaccine 20-valent (Prevnar 20) (Completed)     Screening for colon cancer       Referral to GI placed today.   Relevant Orders   Ambulatory referral to Gastroenterology        Preventative Services:  Health Risk Assessment and Personalized Prevention Plan: Done today Bone Mass Measurements: up to date Breast Cancer Screening: up to date CVD Screening: up to date Cervical Cancer Screening: N/A Colon Cancer Screening: Ordered today Depression Screening: Done today Diabetes Screening: Up to date Glaucoma Screening: see your eye doctor Hepatitis B vaccine: N/A Hepatitis C screening: up to date HIV Screening: up to date Flu Vaccine: Declined Lung cancer Screening: N/A Obesity Screening: Done today Pneumonia Vaccines:  given today STI Screening: N/A  Follow up plan: Return in about 6 months (around 02/10/2024).   LABORATORY TESTING:  - Pap smear: not applicable  IMMUNIZATIONS:   - Tdap: Tetanus vaccination status reviewed: last tetanus booster within 10 years. - Influenza: Refused - Prevnar: Administered today - Zostavax vaccine: Refused  SCREENING: -Mammogram: Up to date  - Colonoscopy: Ordered today  - Bone Density: Up to date   PATIENT COUNSELING:   Advised to take 1 mg of folate supplement per day if capable of pregnancy.   Sexuality: Discussed sexually transmitted diseases, partner selection, use of condoms, avoidance of unintended pregnancy  and contraceptive alternatives.   Advised to avoid cigarette smoking.  I discussed with the patient that most people either abstain from alcohol or drink within safe limits (<=14/week and <=4 drinks/occasion  for males, <=7/weeks and <= 3 drinks/occasion for females) and that the risk for alcohol disorders and other health effects rises proportionally with the number of drinks per week and how often a drinker exceeds daily limits.  Discussed cessation/primary prevention of drug use and availability of treatment for abuse.   Diet: Encouraged to adjust caloric intake to maintain  or achieve ideal body weight, to reduce intake of dietary saturated fat and total fat, to limit sodium intake by avoiding high sodium foods and not adding table salt, and to maintain adequate dietary potassium and calcium preferably from fresh fruits, vegetables, and low-fat dairy products.    stressed the importance of regular exercise  Injury prevention: Discussed safety belts, safety helmets, smoke detector, smoking near bedding or upholstery.   Dental health: Discussed importance of regular tooth brushing, flossing, and dental visits.    NEXT PREVENTATIVE PHYSICAL DUE IN 1 YEAR. Return in about 6 months (around 02/10/2024).

## 2023-08-19 ENCOUNTER — Telehealth: Payer: Self-pay

## 2023-08-19 NOTE — Telephone Encounter (Signed)
Pt returning your call to schedule her colonscopy.

## 2023-08-20 ENCOUNTER — Telehealth: Payer: Self-pay

## 2023-08-20 NOTE — Telephone Encounter (Signed)
Colonoscopy referral discussed.  She had her previous colonoscopys with Dr. Norma Fredrickson.  Informed her that she has her appt scheduled with Dr.Toledo 09/10/23 at 9:30am.  Her colonoscopy referral with our office will be closed.  Thanks,  [place], [place]

## 2023-08-20 NOTE — Telephone Encounter (Signed)
LVM for pt to return my call.   Thanks,  [place], [place]

## 2023-10-15 ENCOUNTER — Ambulatory Visit: Admitting: Family Medicine

## 2023-12-14 ENCOUNTER — Other Ambulatory Visit: Payer: Self-pay | Admitting: Family Medicine

## 2023-12-14 NOTE — Telephone Encounter (Signed)
 Copied from CRM [id number]. Topic: Clinical - Medication Refill >> Dec 14, 2023  9:58 AM Felizardo Hotter wrote: Medication: cyclobenzaprine  (FLEXERIL ) 10 MG tablet  Has the patient contacted their pharmacy? Yes (Agent: If no, request that the patient contact the pharmacy for the refill. If patient does not wish to contact the pharmacy document the reason why and proceed with request.) (Agent: If yes, when and what did the pharmacy advise?)  This is the patient's preferred pharmacy:  [facility] [address]), [place] - [address] [place]) [place] [address] Phone: [phone] Fax: [phone]  Is this the correct pharmacy for this prescription? Yes If no, delete pharmacy and type the correct one.   Has the prescription been filled recently? Yes  Is the patient out of the medication? Yes  Has the patient been seen for an appointment in the last year OR does the patient have an upcoming appointment? Yes  Can we respond through MyChart? Yes  Agent: Please be advised that Rx refills may take up to 3 business days. We ask that you follow-up with your pharmacy.

## 2023-12-15 NOTE — Telephone Encounter (Signed)
 Requested medication (s) are due for refill today - yes  Requested medication (s) are on the active medication list -yes  Future visit scheduled -yes  Last refill: 08/03/23 #30   Notes to clinic: non delegated Rx  Requested Prescriptions  Pending Prescriptions Disp Refills   cyclobenzaprine  (FLEXERIL ) 10 MG tablet 30 tablet 0    Sig: Take 1 tablet (10 mg total) by mouth at bedtime.     Not Delegated - Analgesics:  Muscle Relaxants Failed - 12/15/2023  2:59 PM      Failed - This refill cannot be delegated      Failed - Valid encounter within last 6 months    Recent Outpatient Visits   None     Future Appointments             In 1 month Johnson, Megan P, DO [place] [facility]               Requested Prescriptions  Pending Prescriptions Disp Refills   cyclobenzaprine  (FLEXERIL ) 10 MG tablet 30 tablet 0    Sig: Take 1 tablet (10 mg total) by mouth at bedtime.     Not Delegated - Analgesics:  Muscle Relaxants Failed - 12/15/2023  2:59 PM      Failed - This refill cannot be delegated      Failed - Valid encounter within last 6 months    Recent Outpatient Visits   None     Future Appointments             In 1 month Johnson, Jerilee Montane, DO [place] [facility] : [facility], [facility]

## 2023-12-17 MED ORDER — CYCLOBENZAPRINE HCL 10 MG PO TABS: 10.0000 mg | ORAL_TABLET | Freq: Every day | ORAL | 0 refills | Status: DC

## 2024-02-11 ENCOUNTER — Ambulatory Visit (INDEPENDENT_AMBULATORY_CARE_PROVIDER_SITE_OTHER): Payer: Self-pay | Admitting: Family Medicine

## 2024-02-11 ENCOUNTER — Telehealth: Payer: Self-pay | Admitting: Family Medicine

## 2024-02-11 ENCOUNTER — Encounter: Payer: Self-pay | Admitting: Family Medicine

## 2024-02-11 VITALS — BP 116/71 | HR 73 | Temp 98.0°F | Ht 65.0 in | Wt 217.2 lb

## 2024-02-11 DIAGNOSIS — G4733 Obstructive sleep apnea (adult) (pediatric): Secondary | ICD-10-CM | POA: Diagnosis not present

## 2024-02-11 DIAGNOSIS — Z1211 Encounter for screening for malignant neoplasm of colon: Secondary | ICD-10-CM

## 2024-02-11 DIAGNOSIS — R7301 Impaired fasting glucose: Secondary | ICD-10-CM | POA: Diagnosis not present

## 2024-02-11 DIAGNOSIS — Z789 Other specified health status: Secondary | ICD-10-CM

## 2024-02-11 DIAGNOSIS — E782 Mixed hyperlipidemia: Secondary | ICD-10-CM | POA: Diagnosis not present

## 2024-02-11 DIAGNOSIS — E559 Vitamin D deficiency, unspecified: Secondary | ICD-10-CM | POA: Diagnosis not present

## 2024-02-11 DIAGNOSIS — M1712 Unilateral primary osteoarthritis, left knee: Secondary | ICD-10-CM | POA: Insufficient documentation

## 2024-02-11 LAB — BAYER DCA HB A1C WAIVED: HB A1C (BAYER DCA - WAIVED): 6 % — ABNORMAL HIGH (ref 4.8–5.6)

## 2024-02-11 NOTE — Progress Notes (Signed)
 BP 116/71   Pulse 73   Temp 98 F (36.7 C) (Oral)   Ht 5' 5 (1.651 m)   Wt 217 lb 3.2 oz (98.5 kg)   SpO2 97%   BMI 36.14 kg/m    Subjective:    Patient ID: Sandra Mendez, female    DOB: May 10, 1956, 68 y.o.   MRN: [id number]  HPI: Sandra Mendez is a 68 y.o. female  Chief Complaint  Patient presents with   Knee Problem    Left knee. Pt states that it feels like it is heavy and needs to pop.    Hyperlipidemia   IFG   KNEE PAIN Duration: couple of months Involved knee: left Mechanism of injury: unknown Location:diffuse Onset: gradual Severity: moderate  Quality:  needing pop, aching Frequency: intermittent Radiation: no Aggravating factors: nothing  Alleviating factors: nothing  Status: worse Treatments attempted: rest  Relief with NSAIDs?:  no Weakness with weight bearing or walking: no Sensation of giving way: yes Locking: yes Popping: yes Bruising: no Swelling: no Redness: no Paresthesias/decreased sensation: no Fevers: no  SLEEP APNEA Sleep apnea status: uncontrolled Duration: chronic Satisfied with current treatment?:  no CPAP use:  no Sleep quality with CPAP use: N/A Treament compliance:poor compliance Last sleep study: >5 years ago, not 100% sure Treatments attempted: none Wakes feeling refreshed:  no Daytime hypersomnolence:  yes Fatigue:  yes Insomnia:  no Good sleep hygiene:  yes Difficulty falling asleep:  no Difficulty staying asleep:  no Snoring bothers bed partner:  yes Observed apnea by bed partner: yes Obesity:  yes Hypertension: no  Pulmonary hypertension:  no Coronary artery disease:  no  HYPERLIPIDEMIA Hyperlipidemia status: Stable Satisfied with current treatment?  yes Side effects:  N/A Past cholesterol meds: none Supplements: none Aspirin:  no The 10-year ASCVD risk score (Arnett DK, et al., 2019) is: 13%   Values used to calculate the score:     Age: 65 years     Clincally relevant sex: Female     Is  Non-Hispanic African American: Yes     Diabetic: No     Tobacco smoker: No     Systolic Blood Pressure: 116 mmHg     Is BP treated: No     HDL Cholesterol: 70 mg/dL     Total Cholesterol: 271 mg/dL Chest pain:  no Coronary artery disease:  no  Impaired Fasting Glucose HbA1C:  Lab Results  Component Value Date   HGBA1C 6.0 (H) 02/11/2024   Duration of elevated blood sugar: chronic Polydipsia: no Polyuria: no Weight change: yes Visual disturbance: no Glucose Monitoring: no Diabetic Education: Completed Family history of diabetes: yes   Relevant past medical, surgical, family and social history reviewed and updated as indicated. Interim medical history since our last visit reviewed. Allergies and medications reviewed and updated.  Review of Systems  Constitutional: Negative.   Respiratory: Negative.    Cardiovascular: Negative.   Musculoskeletal:  Positive for arthralgias. Negative for back pain, gait problem, joint swelling, myalgias, neck pain and neck stiffness.  Skin: Negative.   Psychiatric/Behavioral: Negative.      Per HPI unless specifically indicated above     Objective:    BP 116/71   Pulse 73   Temp 98 F (36.7 C) (Oral)   Ht 5' 5 (1.651 m)   Wt 217 lb 3.2 oz (98.5 kg)   SpO2 97%   BMI 36.14 kg/m   Wt Readings from Last 3 Encounters:  02/11/24 217 lb 3.2 oz (98.5  kg)  08/13/23 207 lb 3.2 oz (94 kg)  05/13/23 197 lb (89.4 kg)    Physical Exam Vitals and nursing note reviewed.  Constitutional:      General: She is not in acute distress.    Appearance: Normal appearance. She is obese. She is not ill-appearing, toxic-appearing or diaphoretic.  HENT:     Head: Normocephalic and atraumatic.     Right Ear: External ear normal.     Left Ear: External ear normal.     Nose: Nose normal.     Mouth/Throat:     Mouth: Mucous membranes are moist.     Pharynx: Oropharynx is clear.  Eyes:     General: No scleral icterus.       Right eye: No discharge.         Left eye: No discharge.     Extraocular Movements: Extraocular movements intact.     Conjunctiva/sclera: Conjunctivae normal.     Pupils: Pupils are equal, round, and reactive to light.  Cardiovascular:     Rate and Rhythm: Normal rate and regular rhythm.     Pulses: Normal pulses.     Heart sounds: Normal heart sounds. No murmur heard.    No friction rub. No gallop.  Pulmonary:     Effort: Pulmonary effort is normal. No respiratory distress.     Breath sounds: Normal breath sounds. No stridor. No wheezing, rhonchi or rales.  Chest:     Chest wall: No tenderness.  Musculoskeletal:        General: Normal range of motion.     Cervical back: Normal range of motion and neck supple.  Skin:    General: Skin is warm and dry.     Capillary Refill: Capillary refill takes less than 2 seconds.     Coloration: Skin is not jaundiced or pale.     Findings: No bruising, erythema, lesion or rash.  Neurological:     General: No focal deficit present.     Mental Status: She is alert and oriented to person, place, and time. Mental status is at baseline.  Psychiatric:        Mood and Affect: Mood normal.        Behavior: Behavior normal.        Thought Content: Thought content normal.        Judgment: Judgment normal.     Results for orders placed or performed in visit on 02/11/24  Bayer DCA Hb A1c Waived   Collection Time: 02/11/24  8:49 AM  Result Value Ref Range   HB A1C (BAYER DCA - WAIVED) 6.0 (H) 4.8 - 5.6 %      Assessment & Plan:   Problem List Items Addressed This Visit       Respiratory   Obstructive sleep apnea   Not on CPAP- unable to tolerate. Has not had sleep study in >5 years. Will repeat. Would be a good candidate for zepbound. Call with any concerns.       Relevant Orders   Ambulatory referral to Sleep Studies     Endocrine   IFG (impaired fasting glucose)   Rechecking labs today. Await results. Treat as needed.       Relevant Orders   Bayer DCA Hb A1c  Waived (Completed)   CBC with Differential/Platelet   Comprehensive metabolic panel with GFR     Musculoskeletal and Integument   Arthritis of left knee   Will get her in for PT. Call with any concerns. Recheck  2 months.       Relevant Orders   Ambulatory referral to Physical Therapy     Other   Hyperlipidemia - Primary   Rechecking labs today. Await results. Treat as needed.       Relevant Orders   CBC with Differential/Platelet   Comprehensive metabolic panel with GFR   Lipid Panel w/o Chol/HDL Ratio   Vitamin D  deficiency   Rechecking labs today. Await results. Treat as needed.       Relevant Orders   CBC with Differential/Platelet   Comprehensive metabolic panel with GFR   VITAMIN D  25 Hydroxy (Vit-D Deficiency, Fractures)   Other Visit Diagnoses       Screening for colon cancer       Referral to GI placed today.   Relevant Orders   Ambulatory referral to Gastroenterology     Hepatitis B vaccination status unknown       Checking on status. Await results.   Relevant Orders   Hepatitis B surface antibody,quantitative        Follow up plan: Return in about 2 months (around 04/13/2024).

## 2024-02-11 NOTE — Assessment & Plan Note (Signed)
 Rechecking labs today. Await results. Treat as needed.

## 2024-02-11 NOTE — Telephone Encounter (Signed)
 Patient dropped off Disability Parking Placard to be filled out by provider. Patient is requesting a call back at [phone] within 2-5 days when completed. Document is located in providers folder.

## 2024-02-11 NOTE — Assessment & Plan Note (Signed)
 Not on CPAP- unable to tolerate. Has not had sleep study in >5 years. Will repeat. Would be a good candidate for zepbound. Call with any concerns.

## 2024-02-11 NOTE — Assessment & Plan Note (Signed)
 Will get her in for PT. Call with any concerns. Recheck 2 months.

## 2024-02-12 LAB — COMPREHENSIVE METABOLIC PANEL WITH GFR
ALT: 11 IU/L (ref 0–32)
AST: 19 IU/L (ref 0–40)
Albumin: 4 g/dL (ref 3.9–4.9)
Alkaline Phosphatase: 97 IU/L (ref 44–121)
BUN/Creatinine Ratio: 15 (ref 12–28)
BUN: 13 mg/dL (ref 8–27)
Bilirubin Total: 0.2 mg/dL (ref 0.0–1.2)
CO2: 18 mmol/L — ABNORMAL LOW (ref 20–29)
Calcium: 8.6 mg/dL — ABNORMAL LOW (ref 8.7–10.3)
Chloride: 112 mmol/L — ABNORMAL HIGH (ref 96–106)
Creatinine, Ser: 0.86 mg/dL (ref 0.57–1.00)
Globulin, Total: 2.5 g/dL (ref 1.5–4.5)
Glucose: 89 mg/dL (ref 70–99)
Potassium: 4.1 mmol/L (ref 3.5–5.2)
Sodium: 146 mmol/L — ABNORMAL HIGH (ref 134–144)
Total Protein: 6.5 g/dL (ref 6.0–8.5)
eGFR: 73 mL/min/1.73 (ref >59)

## 2024-02-12 LAB — CBC WITH DIFFERENTIAL/PLATELET
Basophils Absolute: 0 x10E3/uL (ref 0.0–0.2)
Basos: 1 %
EOS (ABSOLUTE): 0.1 x10E3/uL (ref 0.0–0.4)
Eos: 2 %
Hematocrit: 38.8 % (ref 34.0–46.6)
Hemoglobin: 12.2 g/dL (ref 11.1–15.9)
Immature Grans (Abs): 0 x10E3/uL (ref 0.0–0.1)
Immature Granulocytes: 0 %
Lymphocytes Absolute: 1.2 x10E3/uL (ref 0.7–3.1)
Lymphs: 29 %
MCH: 30.4 pg (ref 26.6–33.0)
MCHC: 31.4 g/dL — ABNORMAL LOW (ref 31.5–35.7)
MCV: 97 fL (ref 79–97)
Monocytes Absolute: 0.3 x10E3/uL (ref 0.1–0.9)
Monocytes: 7 %
Neutrophils Absolute: 2.4 x10E3/uL (ref 1.4–7.0)
Neutrophils: 61 %
Platelets: 193 x10E3/uL (ref 150–450)
RBC: 4.01 x10E6/uL (ref 3.77–5.28)
RDW: 13.1 % (ref 11.7–15.4)
WBC: 4.1 x10E3/uL (ref 3.4–10.8)

## 2024-02-12 LAB — LIPID PANEL W/O CHOL/HDL RATIO
Cholesterol, Total: 235 mg/dL — ABNORMAL HIGH (ref 100–199)
HDL: 74 mg/dL (ref >39)
LDL Chol Calc (NIH): 147 mg/dL — ABNORMAL HIGH (ref 0–99)
Triglycerides: 81 mg/dL (ref 0–149)
VLDL Cholesterol Cal: 14 mg/dL (ref 5–40)

## 2024-02-12 LAB — HEPATITIS B SURFACE ANTIBODY, QUANTITATIVE: Hepatitis B Surf Ab Quant: 46.7 m[IU]/mL

## 2024-02-12 LAB — VITAMIN D 25 HYDROXY (VIT D DEFICIENCY, FRACTURES): Vit D, 25-Hydroxy: 22.6 ng/mL — ABNORMAL LOW (ref 30.0–100.0)

## 2024-02-14 ENCOUNTER — Ambulatory Visit: Payer: Self-pay | Admitting: Family Medicine

## 2024-03-06 ENCOUNTER — Other Ambulatory Visit: Payer: Self-pay | Admitting: Family Medicine

## 2024-03-06 NOTE — Telephone Encounter (Signed)
 Copied from CRM [id number]. Topic: Clinical - Medication Refill >> Mar 06, 2024  2:40 PM Shereese L wrote: Medication: cyclobenzaprine  (FLEXERIL ) 10 MG tablet  Has the patient contacted their pharmacy? Yes (Agent: If no, request that the patient contact the pharmacy for the refill. If patient does not wish to contact the pharmacy document the reason why and proceed with request.) (Agent: If yes, when and what did the pharmacy advise?)  This is the patient's preferred pharmacy:  [facility] [address]), [place] - [address] [place] [address]) [place] [address] Phone: [phone] Fax: [phone]  Is this the correct pharmacy for this prescription? Yes If no, delete pharmacy and type the correct one.   Has the prescription been filled recently? Yes  Is the patient out of the medication? Yes  Has the patient been seen for an appointment in the last year OR does the patient have an upcoming appointment? Yes  Can we respond through MyChart? Yes  Agent: Please be advised that Rx refills may take up to 3 business days. We ask that you follow-up with your pharmacy.

## 2024-03-07 ENCOUNTER — Other Ambulatory Visit: Payer: Self-pay | Admitting: Family Medicine

## 2024-03-07 NOTE — Telephone Encounter (Signed)
 Copied from CRM [id number]. Topic: Clinical - Medication Refill >> Mar 06, 2024  2:40 PM Shereese L wrote: Medication: cyclobenzaprine  (FLEXERIL ) 10 MG tablet  Has the patient contacted their pharmacy? Yes (Agent: If no, request that the patient contact the pharmacy for the refill. If patient does not wish to contact the pharmacy document the reason why and proceed with request.) (Agent: If yes, when and what did the pharmacy advise?)  This is the patient's preferred pharmacy:  [facility] [address]), [place] - [address] [place] [address]) [place] [address] Phone: [phone] Fax: [phone]  Is this the correct pharmacy for this prescription? Yes If no, delete pharmacy and type the correct one.   Has the prescription been filled recently? Yes  Is the patient out of the medication? Yes  Has the patient been seen for an appointment in the last year OR does the patient have an upcoming appointment? Yes  Can we respond through MyChart? Yes  Agent: Please be advised that Rx refills may take up to 3 business days. We ask that you follow-up with your pharmacy.

## 2024-03-07 NOTE — Telephone Encounter (Signed)
Routing to provider for refill

## 2024-03-08 MED ORDER — CYCLOBENZAPRINE HCL 10 MG PO TABS: 10.0000 mg | ORAL_TABLET | Freq: Every day | ORAL | 0 refills | Status: DC

## 2024-03-09 NOTE — Telephone Encounter (Signed)
 Requested medications are due for refill today.  no  Requested medications are on the active medications list.  yes  Last refill. 03/08/2024  Future visit scheduled.   yes  Notes to clinic.  Refusal not delegated.    Requested Prescriptions  Pending Prescriptions Disp Refills   cyclobenzaprine  (FLEXERIL ) 10 MG tablet 30 tablet 0    Sig: Take 1 tablet (10 mg total) by mouth at bedtime.     Not Delegated - Analgesics:  Muscle Relaxants Failed - 03/09/2024  1:35 PM      Failed - This refill cannot be delegated      Passed - Valid encounter within last 6 months    Recent Outpatient Visits           3 weeks ago Mixed hyperlipidemia   [facility] [place], [place], DO

## 2024-03-22 ENCOUNTER — Telehealth: Payer: Self-pay

## 2024-03-22 NOTE — Telephone Encounter (Signed)
 Copied from CRM [id number]. Topic: Clinical - Request for Lab/Test Order >> Mar 22, 2024 11:44 AM Tobias CROME wrote: Reason for CRM: Patient inquiring if her results came back for her sleep apnea test.   Please reach out to patient with results, [phone]

## 2024-03-23 NOTE — Telephone Encounter (Signed)
 Dr JINNY, have you seen these results by chance?

## 2024-03-24 NOTE — Telephone Encounter (Signed)
 Sleep study shows mild sleep apnea. Will start CPAP autotitration. Can we please fill out order for autotitrating cpap. Sleep study up front.

## 2024-03-30 NOTE — Telephone Encounter (Signed)
 Order is pending through [facility] in your email. Thank you!

## 2024-03-30 NOTE — Telephone Encounter (Signed)
 Pt called for update on sleep apnea results, is concerned that she has not heard back from the clinic. Please advise   Best contact: [phone]

## 2024-03-30 NOTE — Telephone Encounter (Signed)
Order signed in parachute

## 2024-04-21 ENCOUNTER — Ambulatory Visit: Admitting: Family Medicine

## 2024-04-24 ENCOUNTER — Telehealth: Payer: Self-pay | Admitting: Family Medicine

## 2024-04-24 NOTE — Telephone Encounter (Signed)
 Prescription Request  04/24/2024  LOV: 02/11/2024  What is the name of the medication or equipment? cyclobenzaprine  (FLEXERIL ) 10 MG tablet   Have you contacted your pharmacy to request a refill? No   Which pharmacy would you like this sent to?  [facility] [address]), [place] - [address] [place]) [place] [address] Phone: [phone] Fax: [phone]    Patient notified that their request is being sent to the clinical staff for review and that they should receive a response within 2 business days.   Please advise at Mobile [phone] (mobile)

## 2024-05-04 ENCOUNTER — Other Ambulatory Visit: Payer: Self-pay | Admitting: Family Medicine

## 2024-05-04 MED ORDER — CYCLOBENZAPRINE HCL 10 MG PO TABS: 10.0000 mg | ORAL_TABLET | Freq: Every day | ORAL | 0 refills | Status: DC

## 2024-05-04 NOTE — Telephone Encounter (Signed)
 Copied from CRM [id number]. Topic: Clinical - Medication Question >> May 04, 2024  2:52 PM Olam RAMAN wrote: Reason for CRM: pt asked for an update on medicaion. Per notes advised: We received a refill request for you. You will need to make an appointment to see Dr. Vicci for additional refills. Please reach out to our office at [phone] to get set up and scheduled for an appointment. Please let me know if you have any additional questions or concerns.  cyclobenzaprine  (FLEXERIL ) 10 MG tablet. Caller stated she always gets her meds called in I advised I can send a message out for an update since she has an appt 11/6 and on the wait list

## 2024-05-04 NOTE — Telephone Encounter (Signed)
 Routing to provider. Patient has upcoming appointment scheduled on 06/01/24. Requesting to have Flexeril  refilled to get her to the appointment.

## 2024-05-04 NOTE — Addendum Note (Signed)
 Addended by: NELWYN LAYMON SAILOR on: 05/04/2024 04:28 PM   Modules accepted: Orders

## 2024-05-04 NOTE — Telephone Encounter (Signed)
 She is scheduled!

## 2024-05-04 NOTE — Telephone Encounter (Signed)
 Copied from CRM [id number]. Topic: Clinical - Medication Question >> May 04, 2024  2:54 PM Olam RAMAN wrote: Reason for CRM: cyclobenzaprine  (FLEXERIL ) 10 MG tablet Pt called in for med status. Advised per notes show she would have to make an appt. Advised appt 11/6 and stated she is on the wait list as well

## 2024-05-09 ENCOUNTER — Encounter: Payer: Self-pay | Admitting: Family Medicine

## 2024-05-09 ENCOUNTER — Ambulatory Visit (INDEPENDENT_AMBULATORY_CARE_PROVIDER_SITE_OTHER): Admitting: Family Medicine

## 2024-05-09 VITALS — BP 124/74 | Wt 220.0 lb

## 2024-05-09 DIAGNOSIS — G4733 Obstructive sleep apnea (adult) (pediatric): Secondary | ICD-10-CM | POA: Diagnosis not present

## 2024-05-09 DIAGNOSIS — N898 Other specified noninflammatory disorders of vagina: Secondary | ICD-10-CM

## 2024-05-09 LAB — WET PREP FOR TRICH, YEAST, CLUE
Clue Cell Exam: NEGATIVE
Trichomonas Exam: NEGATIVE
Yeast Exam: NEGATIVE

## 2024-05-09 MED ORDER — WEGOVY 0.5 MG/0.5ML ~~LOC~~ SOAJ: 0.5000 mg | SUBCUTANEOUS | 1 refills | Status: DC

## 2024-05-09 MED ORDER — WEGOVY 0.25 MG/0.5ML ~~LOC~~ SOAJ: 0.2500 mg | SUBCUTANEOUS | 0 refills | Status: DC

## 2024-05-09 NOTE — Progress Notes (Signed)
 BP 124/74   Wt 220 lb (99.8 kg)   BMI 36.61 kg/m    Subjective:    Patient ID: Sandra Mendez, female    DOB: 04-22-1956, 68 y.o.   MRN: [id number]  HPI: Sandra Mendez is a 68 y.o. female  Chief Complaint  Patient presents with   Sleep Apnea    Need cpap supplies    Vaginitis    Onset about a week ago. Vaginal itching    SLEEP APNEA Sleep apnea status: controlled Duration: chronic- has had the cpap for about a month Satisfied with current treatment?:  yes CPAP use:  yes Sleep quality with CPAP use: excellent Treament compliance:excellent compliance Last sleep study: 03/07/24 Treatments attempted: CPAP Wakes feeling refreshed:  yes Daytime hypersomnolence:  no Fatigue:  no Insomnia:  no Good sleep hygiene:  yes Difficulty falling asleep:  no Difficulty staying asleep:  no Snoring bothers bed partner:  no Observed apnea by bed partner: no Obesity:  yes Hypertension: no  Pulmonary hypertension:  no Coronary artery disease:  no  VAGINAL ITCHING Duration: weeks Discharge description: no discharge   Pruritus: yes Dysuria: no Malodorous: no Urinary frequency: no Fevers: no Abdominal pain: no  History of sexually transmitted diseases: no Recent antibiotic use: no Context: worse  Treatments attempted: none  OBESITY Duration: chronic Previous attempts at weight loss: yes including bariatric surgery Complications of obesity: IFG, HLD Peak weight: 220# Weight loss goal: to be healthy Weight loss to date: none Requesting obesity pharmacotherapy: yes Current weight loss supplements/medications: no Previous weight loss supplements/meds: no   Relevant past medical, surgical, family and social history reviewed and updated as indicated. Interim medical history since our last visit reviewed. Allergies and medications reviewed and updated.  Review of Systems  Constitutional: Negative.   Respiratory: Negative.    Cardiovascular: Negative.   Musculoskeletal:  Negative.   Neurological: Negative.   Psychiatric/Behavioral: Negative.      Per HPI unless specifically indicated above     Objective:    BP 124/74   Wt 220 lb (99.8 kg)   BMI 36.61 kg/m   Wt Readings from Last 3 Encounters:  05/09/24 220 lb (99.8 kg)  02/11/24 217 lb 3.2 oz (98.5 kg)  08/13/23 207 lb 3.2 oz (94 kg)    Physical Exam Vitals and nursing note reviewed.  Constitutional:      General: She is not in acute distress.    Appearance: Normal appearance. She is obese. She is not ill-appearing, toxic-appearing or diaphoretic.  HENT:     Head: Normocephalic and atraumatic.     Right Ear: External ear normal.     Left Ear: External ear normal.     Nose: Nose normal.     Mouth/Throat:     Mouth: Mucous membranes are moist.     Pharynx: Oropharynx is clear.  Eyes:     General: No scleral icterus.       Right eye: No discharge.        Left eye: No discharge.     Extraocular Movements: Extraocular movements intact.     Conjunctiva/sclera: Conjunctivae normal.     Pupils: Pupils are equal, round, and reactive to light.  Cardiovascular:     Rate and Rhythm: Normal rate and regular rhythm.     Pulses: Normal pulses.     Heart sounds: Normal heart sounds. No murmur heard.    No friction rub. No gallop.  Pulmonary:     Effort: Pulmonary effort is normal.  No respiratory distress.     Breath sounds: Normal breath sounds. No stridor. No wheezing, rhonchi or rales.  Chest:     Chest wall: No tenderness.  Musculoskeletal:        General: Normal range of motion.     Cervical back: Normal range of motion and neck supple.  Skin:    General: Skin is warm and dry.     Capillary Refill: Capillary refill takes less than 2 seconds.     Coloration: Skin is not jaundiced or pale.     Findings: No bruising, erythema, lesion or rash.  Neurological:     General: No focal deficit present.     Mental Status: She is alert and oriented to person, place, and time. Mental status is at  baseline.  Psychiatric:        Mood and Affect: Mood normal.        Behavior: Behavior normal.        Thought Content: Thought content normal.        Judgment: Judgment normal.     Results for orders placed or performed in visit on 02/11/24  Bayer DCA Hb A1c Waived   Collection Time: 02/11/24  8:49 AM  Result Value Ref Range   HB A1C (BAYER DCA - WAIVED) 6.0 (H) 4.8 - 5.6 %  CBC with Differential/Platelet   Collection Time: 02/11/24  8:50 AM  Result Value Ref Range   WBC 4.1 3.4 - 10.8 x10E3/uL   RBC 4.01 3.77 - 5.28 x10E6/uL   Hemoglobin 12.2 11.1 - 15.9 g/dL   Hematocrit 61.1 65.9 - 46.6 %   MCV 97 79 - 97 fL   MCH 30.4 26.6 - 33.0 pg   MCHC 31.4 (L) 31.5 - 35.7 g/dL   RDW 86.8 88.2 - 84.5 %   Platelets 193 150 - 450 x10E3/uL   Neutrophils 61 Not Estab. %   Lymphs 29 Not Estab. %   Monocytes 7 Not Estab. %   Eos 2 Not Estab. %   Basos 1 Not Estab. %   Neutrophils Absolute 2.4 1.4 - 7.0 x10E3/uL   Lymphocytes Absolute 1.2 0.7 - 3.1 x10E3/uL   Monocytes Absolute 0.3 0.1 - 0.9 x10E3/uL   EOS (ABSOLUTE) 0.1 0.0 - 0.4 x10E3/uL   Basophils Absolute 0.0 0.0 - 0.2 x10E3/uL   Immature Granulocytes 0 Not Estab. %   Immature Grans (Abs) 0.0 0.0 - 0.1 x10E3/uL  Comprehensive metabolic panel with GFR   Collection Time: 02/11/24  8:50 AM  Result Value Ref Range   Glucose 89 70 - 99 mg/dL   BUN 13 8 - 27 mg/dL   Creatinine, Ser 9.13 0.57 - 1.00 mg/dL   eGFR 73 >40 fO/fpw/8.26   BUN/Creatinine Ratio 15 12 - 28   Sodium 146 (H) 134 - 144 mmol/L   Potassium 4.1 3.5 - 5.2 mmol/L   Chloride 112 (H) 96 - 106 mmol/L   CO2 18 (L) 20 - 29 mmol/L   Calcium 8.6 (L) 8.7 - 10.3 mg/dL   Total Protein 6.5 6.0 - 8.5 g/dL   Albumin 4.0 3.9 - 4.9 g/dL   Globulin, Total 2.5 1.5 - 4.5 g/dL   Bilirubin Total 0.2 0.0 - 1.2 mg/dL   Alkaline Phosphatase 97 44 - 121 IU/L   AST 19 0 - 40 IU/L   ALT 11 0 - 32 IU/L  Lipid Panel w/o Chol/HDL Ratio   Collection Time: 02/11/24  8:50 AM  Result Value  Ref Range  Cholesterol, Total 235 (H) 100 - 199 mg/dL   Triglycerides 81 0 - 149 mg/dL   HDL 74 >60 mg/dL   VLDL Cholesterol Cal 14 5 - 40 mg/dL   LDL Chol Calc (NIH) 852 (H) 0 - 99 mg/dL  VITAMIN D  25 Hydroxy (Vit-D Deficiency, Fractures)   Collection Time: 02/11/24  8:50 AM  Result Value Ref Range   Vit D, 25-Hydroxy 22.6 (L) 30.0 - 100.0 ng/mL  Hepatitis B surface antibody,quantitative   Collection Time: 02/11/24  8:50 AM  Result Value Ref Range   Hepatitis B Surf Ab Quant 46.7 Immunity>10 mIU/mL      Assessment & Plan:   Problem List Items Addressed This Visit       Respiratory   Obstructive sleep apnea - Primary   Tolerating her CPAP well. No concerns. Using it consistently.         Other   Morbid obesity (HCC)   Would like to start wegovy out of pocket. Rx sent to her pharmacy. Recheck 6 weeks.       Relevant Medications   semaglutide-weight management (WEGOVY) 0.25 MG/0.5ML SOAJ SQ injection   semaglutide-weight management (WEGOVY) 0.5 MG/0.5ML SOAJ SQ injection (Start on 06/06/2024)   Other Visit Diagnoses       Vaginal itching       Wet prep normal. Call with any concerns.   Relevant Orders   WET PREP FOR TRICH, YEAST, CLUE        Follow up plan: Return in about 3 months (around 08/09/2024).

## 2024-05-09 NOTE — Patient Instructions (Signed)
[email or web address]

## 2024-05-09 NOTE — Assessment & Plan Note (Signed)
 Tolerating her CPAP well. No concerns. Using it consistently.

## 2024-05-09 NOTE — Telephone Encounter (Signed)
 Pt has appt with DJ 10/14

## 2024-05-09 NOTE — Assessment & Plan Note (Signed)
 Would like to start wegovy out of pocket. Rx sent to her pharmacy. Recheck 6 weeks.

## 2024-05-12 ENCOUNTER — Ambulatory Visit: Payer: Self-pay | Admitting: Family Medicine

## 2024-05-30 ENCOUNTER — Encounter: Payer: Self-pay | Admitting: Internal Medicine

## 2024-06-01 ENCOUNTER — Ambulatory Visit: Admitting: Family Medicine

## 2024-06-06 ENCOUNTER — Other Ambulatory Visit: Payer: Self-pay

## 2024-06-06 ENCOUNTER — Ambulatory Visit: Payer: Self-pay

## 2024-06-06 ENCOUNTER — Encounter: Payer: Self-pay | Admitting: Internal Medicine

## 2024-06-06 ENCOUNTER — Ambulatory Visit
Admission: RE | Admit: 2024-06-06 | Discharge: 2024-06-06 | Disposition: A | Discharge status: Home / Self Care | Attending: Internal Medicine | Admitting: Internal Medicine

## 2024-06-06 ENCOUNTER — Encounter
Admission: RE | Disposition: A | Discharge status: Home / Self Care | Payer: Self-pay | Source: Home / Self Care | Attending: Internal Medicine

## 2024-06-06 DIAGNOSIS — K648 Other hemorrhoids: Secondary | ICD-10-CM | POA: Diagnosis not present

## 2024-06-06 DIAGNOSIS — K640 First degree hemorrhoids: Secondary | ICD-10-CM | POA: Insufficient documentation

## 2024-06-06 DIAGNOSIS — G4730 Sleep apnea, unspecified: Secondary | ICD-10-CM | POA: Diagnosis not present

## 2024-06-06 DIAGNOSIS — E785 Hyperlipidemia, unspecified: Secondary | ICD-10-CM | POA: Diagnosis not present

## 2024-06-06 DIAGNOSIS — Z09 Encounter for follow-up examination after completed treatment for conditions other than malignant neoplasm: Secondary | ICD-10-CM | POA: Diagnosis not present

## 2024-06-06 DIAGNOSIS — G4733 Obstructive sleep apnea (adult) (pediatric): Secondary | ICD-10-CM | POA: Diagnosis not present

## 2024-06-06 DIAGNOSIS — Z800 Family history of malignant neoplasm of digestive organs: Secondary | ICD-10-CM | POA: Insufficient documentation

## 2024-06-06 DIAGNOSIS — Z1211 Encounter for screening for malignant neoplasm of colon: Secondary | ICD-10-CM | POA: Diagnosis not present

## 2024-06-06 DIAGNOSIS — D122 Benign neoplasm of ascending colon: Secondary | ICD-10-CM | POA: Diagnosis not present

## 2024-06-06 DIAGNOSIS — K635 Polyp of colon: Secondary | ICD-10-CM | POA: Diagnosis not present

## 2024-06-06 DIAGNOSIS — Z860101 Personal history of adenomatous and serrated colon polyps: Secondary | ICD-10-CM | POA: Diagnosis not present

## 2024-06-06 HISTORY — PX: COLONOSCOPY: SHX5424

## 2024-06-06 SURGERY — COLONOSCOPY
Anesthesia: General

## 2024-06-06 MED ORDER — PROPOFOL 500 MG/50ML IV EMUL
INTRAVENOUS | Status: DC | PRN
  Administered 2024-06-06: 75 ug/kg/min via INTRAVENOUS

## 2024-06-06 MED ORDER — PROPOFOL 10 MG/ML IV BOLUS
INTRAVENOUS | Status: DC | PRN
  Administered 2024-06-06: 50 mg via INTRAVENOUS

## 2024-06-06 MED ORDER — SODIUM CHLORIDE 0.9 % IV SOLN: INTRAVENOUS | Status: DC

## 2024-06-06 MED ORDER — DEXMEDETOMIDINE HCL IN NACL 80 MCG/20ML IV SOLN
INTRAVENOUS | Status: DC | PRN
  Administered 2024-06-06: 12 ug via INTRAVENOUS
  Administered 2024-06-06: 8 ug via INTRAVENOUS

## 2024-06-06 MED ORDER — LIDOCAINE HCL (CARDIAC) PF 100 MG/5ML IV SOSY
PREFILLED_SYRINGE | INTRAVENOUS | Status: DC | PRN
  Administered 2024-06-06: 80 mg via INTRAVENOUS

## 2024-06-06 NOTE — Transfer of Care (Signed)
 Immediate Anesthesia Transfer of Care Note  Patient: Sandra Mendez  Procedure(s) Performed: COLONOSCOPY  Patient Location: PACU  Anesthesia Type:General  Level of Consciousness: sedated  Airway & Oxygen Therapy: Patient Spontanous Breathing  Post-op Assessment: Report given to RN and Post -op Vital signs reviewed and stable  Post vital signs: Reviewed and stable  Last Vitals:  Vitals Value Taken Time  BP 103/67 06/06/24 12:02  Temp 35.9 C 06/06/24 12:02  Pulse 55 06/06/24 12:03  Resp 17 06/06/24 12:03  SpO2 98 % 06/06/24 12:03  Vitals shown include unfiled device data.  Last Pain:  Vitals:   06/06/24 1202  TempSrc: Temporal  PainSc: 0-No pain         Complications: No notable events documented.

## 2024-06-06 NOTE — Anesthesia Postprocedure Evaluation (Signed)
 Anesthesia Post Note  Patient: [name]  Procedure(s) Performed: COLONOSCOPY  Patient location during evaluation: Endoscopy Anesthesia Type: General Level of consciousness: awake and alert Pain management: pain level controlled Vital Signs Assessment: post-procedure vital signs reviewed and stable Respiratory status: spontaneous breathing, nonlabored ventilation, respiratory function stable and patient connected to nasal cannula oxygen Cardiovascular status: blood pressure returned to baseline and stable Postop Assessment: no apparent nausea or vomiting Anesthetic complications: no   No notable events documented.   Last Vitals:  Vitals:   06/06/24 1212 06/06/24 1222  BP: 109/76 126/76  Pulse: (!) 59 (!) 58  Resp: 15 10  Temp:    SpO2: 99% 99%    Last Pain:  Vitals:   06/06/24 1222  TempSrc:   PainSc: 0-No pain                 Lendia LITTIE Mae

## 2024-06-06 NOTE — Op Note (Addendum)
 [facility] Gastroenterology Patient Name: Sandra Mendez Procedure Date: 06/06/2024 11:30 AM MRN: [id number] Account #: [id number] Date of Birth: [birth year] Admit Type: Outpatient Age: 68 Room: [facility] ENDO ROOM 1 Gender: Female Note Status: Supervisor Override Instrument Name: Colon Scope [id number] Procedure:             Colonoscopy Indications:           High risk colon cancer surveillance: Personal history                         of non-advanced adenoma, High risk colon cancer                         surveillance: Personal history of traditional serrated                         adenoma of the colon, Family history of colon cancer Providers:             Miko Sirico K. Sayan Aldava MD, MD Medicines:             Propofol  per Anesthesia Complications:         No immediate complications. Estimated blood loss:                         Minimal. Procedure:             Pre-Anesthesia Assessment:                        - The risks and benefits of the procedure and the                         sedation options and risks were discussed with the                         patient. All questions were answered and informed                         consent was obtained.                        - Patient identification and proposed procedure were                         verified prior to the procedure by the nurse. The                         procedure was verified in the procedure room.                        - ASA Grade Assessment: III - A patient with severe                         systemic disease.                        - After reviewing the risks and benefits, the patient                         was deemed in satisfactory condition to undergo the  procedure.                        After obtaining informed consent, the colonoscope was                         passed under direct vision. Throughout the procedure,                         the patient's blood pressure,  pulse, and oxygen                         saturations were monitored continuously. The                         Colonoscope was introduced through the anus and                         advanced to the the cecum, identified by appendiceal                         orifice and ileocecal valve. The colonoscopy was                         performed without difficulty. The patient tolerated                         the procedure well. The quality of the bowel                         preparation was good. The ileocecal valve, appendiceal                         orifice, and rectum were photographed. Findings:      The perianal and digital rectal examinations were normal. Pertinent       negatives include normal sphincter tone and no palpable rectal lesions.      Non-bleeding internal hemorrhoids were found during retroflexion. The       hemorrhoids were Grade I (internal hemorrhoids that do not prolapse).      A 3 mm polyp was found in the proximal ascending colon. The polyp was       sessile. The polyp was removed with a cold biopsy forceps. Resection and       retrieval were complete. Estimated blood loss was minimal.      The exam was otherwise without abnormality. Impression:            - Non-bleeding internal hemorrhoids.                        - One 3 mm polyp in the proximal ascending colon,                         removed with a cold biopsy forceps. Resected and                         retrieved.                        - The examination was otherwise normal. Recommendation:        -  Patient has a contact number available for                         emergencies. The signs and symptoms of potential                         delayed complications were discussed with the patient.                         Return to normal activities tomorrow. Written                         discharge instructions were provided to the patient.                        - Resume previous diet.                        -  Continue present medications.                        - Await pathology results.                        - Repeat colonoscopy in 3 - 5 years for surveillance                         based on pathology results.                        - Return to GI office PRN.                        - The findings and recommendations were discussed with                         the patient. Procedure Code(s):     --- Professional ---                        [phone], Colonoscopy, flexible; with biopsy, single or                         multiple Diagnosis Code(s):     --- Professional ---                        Z86.010, Personal history of colonic polyps                        K64.0, First degree hemorrhoids                        D12.2, Benign neoplasm of ascending colon CPT copyright 2022 American Medical Association. All rights reserved. The codes documented in this report are preliminary and upon coder review may  be revised to meet current compliance requirements. Ladell MARLA Boss MD, MD 06/06/2024 12:02:02 PM This report has been signed electronically. Number of Addenda: 0 Note Initiated On: 06/06/2024 11:30 AM Scope Withdrawal Time: 0 hours 6 minutes 0 seconds  Total Procedure Duration: 0 hours 11 minutes 10 seconds  Estimated Blood Loss:  Estimated blood loss was minimal.  Estimated blood loss                         was minimal.      [facility]

## 2024-06-06 NOTE — Interval H&P Note (Signed)
 History and Physical Interval Note:  06/06/2024 11:32 AM  Sandra Mendez  has presented today for surgery, with the diagnosis of Personal history of adenomatous and serrated colon polyps (Z86.0101) Family hx of colon cancer (Z80.0).  The various methods of treatment have been discussed with the patient and family. After consideration of risks, benefits and other options for treatment, the patient has consented to  Procedure(s): COLONOSCOPY (N/A) as a surgical intervention.  The patient's history has been reviewed, patient examined, no change in status, stable for surgery.  I have reviewed the patient's chart and labs.  Questions were answered to the patient's satisfaction.     [place], Bette Brienza

## 2024-06-06 NOTE — Anesthesia Preprocedure Evaluation (Addendum)
 Anesthesia Evaluation  Patient identified by MRN, date of birth, ID band Patient awake    Reviewed: Allergy & Precautions, NPO status , Patient's Chart, lab work & pertinent test results  History of Anesthesia Complications Negative for: history of anesthetic complications  Airway Mallampati: III  TM Distance: >3 FB Neck ROM: full    Dental  (+) Partial Upper   Pulmonary sleep apnea and Continuous Positive Airway Pressure Ventilation    Pulmonary exam normal        Cardiovascular negative cardio ROS Normal cardiovascular exam     Neuro/Psych  PSYCHIATRIC DISORDERS Anxiety     negative neurological ROS     GI/Hepatic negative GI ROS, Neg liver ROS,,,  Endo/Other  negative endocrine ROS    Renal/GU negative Renal ROS  negative genitourinary   Musculoskeletal   Abdominal   Peds  Hematology negative hematology ROS (+)   Anesthesia Other Findings Past Medical History: No date: History of colon polyps No date: Obesity No date: Vitamin D  deficiency  Past Surgical History: 2013: BARIATRIC SURGERY No date: CHOLECYSTECTOMY 04/21/2017: COLONOSCOPY WITH PROPOFOL ; N/A     Comment:  Procedure: COLONOSCOPY WITH PROPOFOL ;  Surgeon: [place],               Ladell POUR, MD;  Location: [facility] ENDOSCOPY;  Service:               Endoscopy;  Laterality: N/A; No date: WISDOM TOOTH EXTRACTION     Reproductive/Obstetrics negative OB ROS                              Anesthesia Physical Anesthesia Plan  ASA: 3  Anesthesia Plan: General   Post-op Pain Management: Minimal or no pain anticipated   Induction: Intravenous  PONV Risk Score and Plan: 2 and Propofol  infusion and TIVA  Airway Management Planned: Natural Airway and Nasal Cannula  Additional Equipment:   Intra-op Plan:   Post-operative Plan:   Informed Consent: I have reviewed the patients History and Physical, chart, labs and discussed  the procedure including the risks, benefits and alternatives for the proposed anesthesia with the patient or authorized representative who has indicated his/her understanding and acceptance.     Dental Advisory Given  Plan Discussed with: Anesthesiologist, CRNA and Surgeon  Anesthesia Plan Comments: (Patient consented for risks of anesthesia including but not limited to:  - adverse reactions to medications - risk of airway placement if required - damage to eyes, teeth, lips or other oral mucosa - nerve damage due to positioning  - sore throat or hoarseness - Damage to heart, brain, nerves, lungs, other parts of body or loss of life  Patient voiced understanding and assent.)         Anesthesia Quick Evaluation

## 2024-06-06 NOTE — H&P (Signed)
 Outpatient short stay form Pre-procedure 06/06/2024 11:31 AM Sandra Mendez K. Aundria, M.D.  Primary Physician: Duwaine Louder, D.O.  Reason for visit:  History of adenomatous colon polyps, family history of colon cancer  History of present illness:                            Patient presents for colonoscopy for a personal hx of colon polyps. The patient denies abdominal pain, abnormal weight loss or rectal bleeding.      Current Facility-Administered Medications:    0.9 %  sodium chloride  infusion, , Intravenous, Continuous, [place], Yuki Brunsman K, MD, Last Rate: 20 mL/hr at 06/06/24 1120, New Bag at 06/06/24 1120  Medications Prior to Admission  Medication Sig Dispense Refill Last Dose/Taking   Multiple Vitamin (ONE-DAILY MULTI-VITAMIN PO) Take by mouth.   Past Week   cyclobenzaprine  (FLEXERIL ) 10 MG tablet Take 1 tablet (10 mg total) by mouth at bedtime. As needed 30 tablet 0    semaglutide-weight management (WEGOVY) 0.25 MG/0.5ML SOAJ SQ injection Inject 0.25 mg into the skin once a week. (Patient not taking: Reported on 05/30/2024) 2 mL 0 Not Taking   semaglutide-weight management (WEGOVY) 0.5 MG/0.5ML SOAJ SQ injection Inject 0.5 mg into the skin once a week. (Patient not taking: Reported on 05/30/2024) 2 mL 1 Not Taking   UNABLE TO FIND Take 2 capsules by mouth 2 (two) times daily. Med Name: Micro daily (emf) dietary supplement        Allergies  Allergen Reactions   Erythromycin Rash   Mushroom Extract Complex (Obsolete)    Other Itching and Rash    Green peppers   Penicillins Rash     Past Medical History:  Diagnosis Date   History of colon polyps    Obesity    Vitamin D  deficiency     Review of systems:  Otherwise negative.    Physical Exam  Gen: Alert, oriented. Appears stated age.  HEENT: [place]/AT. PERRLA. Lungs: CTA, no wheezes. CV: RR nl S1, S2. Abd: soft, benign, no masses. BS+ Ext: No edema. Pulses 2+    Planned procedures: Proceed with colonoscopy. The patient  understands the nature of the planned procedure, indications, risks, alternatives and potential complications including but not limited to bleeding, infection, perforation, damage to internal organs and possible oversedation/side effects from anesthesia. The patient agrees and gives consent to proceed.  Please refer to procedure notes for findings, recommendations and patient disposition/instructions.     Madellyn Denio K. Aundria, M.D. Gastroenterology 06/06/2024  11:31 AM

## 2024-06-07 LAB — SURGICAL PATHOLOGY

## 2024-08-04 ENCOUNTER — Telehealth: Payer: Self-pay

## 2024-08-04 ENCOUNTER — Encounter: Payer: Self-pay | Admitting: Family Medicine

## 2024-08-04 ENCOUNTER — Ambulatory Visit (INDEPENDENT_AMBULATORY_CARE_PROVIDER_SITE_OTHER): Admitting: Family Medicine

## 2024-08-04 VITALS — BP 112/69 | HR 91 | Temp 99.1°F | Ht 65.0 in | Wt 219.2 lb

## 2024-08-04 DIAGNOSIS — E559 Vitamin D deficiency, unspecified: Secondary | ICD-10-CM | POA: Diagnosis not present

## 2024-08-04 DIAGNOSIS — Z9884 Bariatric surgery status: Secondary | ICD-10-CM | POA: Diagnosis not present

## 2024-08-04 DIAGNOSIS — R7301 Impaired fasting glucose: Secondary | ICD-10-CM

## 2024-08-04 DIAGNOSIS — E782 Mixed hyperlipidemia: Secondary | ICD-10-CM | POA: Diagnosis not present

## 2024-08-04 LAB — BAYER DCA HB A1C WAIVED: HB A1C (BAYER DCA - WAIVED): 5.8 % — ABNORMAL HIGH (ref 4.8–5.6)

## 2024-08-04 NOTE — Assessment & Plan Note (Signed)
 Would like to start oral wegovy . Rx sent to her pharmacy- cash pay. Recheck in 6 weeks.

## 2024-08-04 NOTE — Assessment & Plan Note (Signed)
 Rechecking labs today. Await results. Treat as needed.

## 2024-08-04 NOTE — Assessment & Plan Note (Addendum)
 Rechecking labs today. Await results. Treat as needed.

## 2024-08-04 NOTE — Telephone Encounter (Signed)
 Copied from CRM #[id number]. Topic: Clinical - Medication Question >> Aug 04, 2024  4:29 PM Myrick T wrote: Reason for CRM: patient called stated there was suppose to be a voucher sent to the [facility] on [address] for the Wegovy  cash pay script as they are asking her for $1K. Please f/u with patient when this has been done for her return to the pharmacy

## 2024-08-04 NOTE — Assessment & Plan Note (Signed)
 Doing well with A1c of 5.8. Continue current regimen. Continue to monitor.

## 2024-08-04 NOTE — Progress Notes (Signed)
 "  BP 112/69   Pulse 91   Temp 99.1 F (37.3 C) (Oral)   Ht 5' 5 (1.651 m)   Wt 219 lb 3.2 oz (99.4 kg)   SpO2 97%   BMI 36.48 kg/m    Subjective:    Patient ID: Sandra Mendez, female    DOB: 06-01-1956, 70 y.o.   MRN: [id number]  HPI: Sandra Mendez is a 69 y.o. female  Chief Complaint  Patient presents with   Obesity   Impaired Fasting Glucose HbA1C:  Lab Results  Component Value Date   HGBA1C 6.0 (H) 02/11/2024   Duration of elevated blood sugar: chronic Polydipsia: no Polyuria: no Weight change: no Visual disturbance: no Glucose Monitoring: no    Accucheck frequency: Not Checking Diabetic Education: Not Completed Family history of diabetes: yes   HYPERLIPIDEMIA Hyperlipidemia status: stable Satisfied with current treatment?  yes Side effects:  N/A  Past cholesterol meds: none Supplements: none Aspirin:  no The 10-year ASCVD risk score (Arnett DK, et al., 2019) is: 10.8%   Values used to calculate the score:     Age: 56 years     Clinically relevant sex: Female     Is Non-Hispanic African American: Yes     Diabetic: No     Tobacco smoker: No     Systolic Blood Pressure: 112 mmHg     Is BP treated: No     HDL Cholesterol: 74 mg/dL     Total Cholesterol: 235 mg/dL Chest pain:  no Coronary artery disease:  no  OBESITY Duration: chronic Previous attempts at weight loss: yes including bariatric surgery Complications of obesity: IFG, HLD Peak weight: 220# Weight loss goal: to be healthy Weight loss to date:1 lb Requesting obesity pharmacotherapy: yes Current weight loss supplements/medications: no Previous weight loss supplements/meds: no   Relevant past medical, surgical, family and social history reviewed and updated as indicated. Interim medical history since our last visit reviewed. Allergies and medications reviewed and updated.  Review of Systems  Constitutional: Negative.   Respiratory: Negative.    Cardiovascular: Negative.    Musculoskeletal: Negative.   Neurological: Negative.   Psychiatric/Behavioral: Negative.      Per HPI unless specifically indicated above     Objective:    BP 112/69   Pulse 91   Temp 99.1 F (37.3 C) (Oral)   Ht 5' 5 (1.651 m)   Wt 219 lb 3.2 oz (99.4 kg)   SpO2 97%   BMI 36.48 kg/m   Wt Readings from Last 3 Encounters:  08/04/24 219 lb 3.2 oz (99.4 kg)  06/06/24 213 lb (96.6 kg)  05/09/24 220 lb (99.8 kg)    Physical Exam Vitals and nursing note reviewed.  Constitutional:      General: She is not in acute distress.    Appearance: Normal appearance. She is not ill-appearing, toxic-appearing or diaphoretic.  HENT:     Head: Normocephalic and atraumatic.     Right Ear: External ear normal.     Left Ear: External ear normal.     Nose: Nose normal.     Mouth/Throat:     Mouth: Mucous membranes are moist.     Pharynx: Oropharynx is clear.  Eyes:     General: No scleral icterus.       Right eye: No discharge.        Left eye: No discharge.     Extraocular Movements: Extraocular movements intact.     Conjunctiva/sclera: Conjunctivae normal.  Pupils: Pupils are equal, round, and reactive to light.  Cardiovascular:     Rate and Rhythm: Normal rate and regular rhythm.     Pulses: Normal pulses.     Heart sounds: Normal heart sounds. No murmur heard.    No friction rub. No gallop.  Pulmonary:     Effort: Pulmonary effort is normal. No respiratory distress.     Breath sounds: Normal breath sounds. No stridor. No wheezing, rhonchi or rales.  Chest:     Chest wall: No tenderness.  Musculoskeletal:        General: Normal range of motion.     Cervical back: Normal range of motion and neck supple.  Skin:    General: Skin is warm and dry.     Capillary Refill: Capillary refill takes less than 2 seconds.     Coloration: Skin is not jaundiced or pale.     Findings: No bruising, erythema, lesion or rash.  Neurological:     General: No focal deficit present.      Mental Status: She is alert and oriented to person, place, and time. Mental status is at baseline.  Psychiatric:        Mood and Affect: Mood normal.        Behavior: Behavior normal.        Thought Content: Thought content normal.        Judgment: Judgment normal.     Results for orders placed or performed during the hospital encounter of 06/06/24  Surgical pathology   Collection Time: 06/06/24 12:00 AM  Result Value Ref Range   SURGICAL PATHOLOGY      SURGICAL PATHOLOGY [facility] [address], [address] [place], [place] [address] Telephone [phone] or [phone] Fax [phone]  REPORT OF SURGICAL PATHOLOGY   Accession #: [id number] Patient Name: Sandra Mendez Visit # : [id number]  MRN: [id number] Physician: Aundria Lombard DOB/Age May 05, 1956 (Age: 63) Gender: F Collected Date: 06/06/2024 Received Date: 06/06/2024  FINAL DIAGNOSIS       1. 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       DATE SIGNED OUT: 06/07/2024 ELECTRONIC SIGNATURE : Belvie Come, John, Pathologist, Electronic Signature  MICROSCOPIC DESCRIPTION  CASE COMMENTS STAINS USED IN DIAGNOSIS: H&E    CLINICAL HISTORY  SPECIMEN(S) OBTAINED 1. Ascending  Colon Polyp, Proximal Cbx  SPECIMEN COMMENTS: SPECIMEN CLINICAL INFORMATION: 1. Personal history of adenomatous and serrated Colon polyps, Family hx of colon cancer. Polyps, internal hemorrhoi ds    Gross Description 1. Received in formalin are tan, soft tissue fragments that are submitted in toto.  Number:  two  Size:  0.3 cm,  1 block. mb 06-06-24        Report signed out from the following location(s) [place] [address] [place], [place] [address] CLIA #: [id number]  [facility] [address] [place], [place] [address] CLIA #: [id number]       Assessment & Plan:   Problem List Items  Addressed This Visit       Endocrine   IFG (impaired fasting glucose)   Doing well with A1c of 5.8. Continue current regimen. Continue to monitor.       Relevant Orders   CBC with Differential/Platelet   Bayer DCA Hb A1c Waived   Comprehensive metabolic panel with GFR     Other   Hyperlipidemia - Primary  Rechecking labs today. Await results. Treat as needed.       Relevant Orders   CBC with Differential/Platelet   Comprehensive metabolic panel with GFR   Lipid Panel w/o Chol/HDL Ratio   Vitamin D  deficiency   Rechecking labs today. Await results. Treat as needed.       Relevant Orders   CBC with Differential/Platelet   Comprehensive metabolic panel with GFR   VITAMIN D  25 Hydroxy (Vit-D Deficiency, Fractures)   S/P bariatric surgery   Rechecking labs today. Await results. Treat as needed.       Relevant Orders   CBC with Differential/Platelet   Comprehensive metabolic panel with GFR   Ferritin   B12   Morbid obesity (HCC)   Would like to start oral wegovy . Rx sent to her pharmacy- cash pay. Recheck in 6 weeks.         Follow up plan: Return in about 6 weeks (around 09/15/2024).      "

## 2024-08-04 NOTE — Patient Instructions (Signed)
 Text SAVE to [phone]

## 2024-08-05 LAB — COMPREHENSIVE METABOLIC PANEL WITH GFR
ALT: 19 IU/L (ref 0–32)
AST: 22 IU/L (ref 0–40)
Albumin: 4.1 g/dL (ref 3.9–4.9)
Alkaline Phosphatase: 111 IU/L (ref 49–135)
BUN/Creatinine Ratio: 17 (ref 12–28)
BUN: 14 mg/dL (ref 8–27)
Bilirubin Total: <0.2 mg/dL (ref 0.0–1.2)
CO2: 19 mmol/L — ABNORMAL LOW (ref 20–29)
Calcium: 8.7 mg/dL (ref 8.7–10.3)
Chloride: 109 mmol/L — ABNORMAL HIGH (ref 96–106)
Creatinine, Ser: 0.83 mg/dL (ref 0.57–1.00)
Globulin, Total: 2.3 g/dL (ref 1.5–4.5)
Glucose: 110 mg/dL — ABNORMAL HIGH (ref 70–99)
Potassium: 3.4 mmol/L — ABNORMAL LOW (ref 3.5–5.2)
Sodium: 142 mmol/L (ref 134–144)
Total Protein: 6.4 g/dL (ref 6.0–8.5)
eGFR: 76 mL/min/1.73

## 2024-08-05 LAB — LIPID PANEL W/O CHOL/HDL RATIO
Cholesterol, Total: 257 mg/dL — ABNORMAL HIGH (ref 100–199)
HDL: 74 mg/dL
LDL Chol Calc (NIH): 142 mg/dL — ABNORMAL HIGH (ref 0–99)
Triglycerides: 230 mg/dL — ABNORMAL HIGH (ref 0–149)
VLDL Cholesterol Cal: 41 mg/dL — ABNORMAL HIGH (ref 5–40)

## 2024-08-05 LAB — CBC WITH DIFFERENTIAL/PLATELET
Basophils Absolute: 0 x10E3/uL (ref 0.0–0.2)
Basos: 0 %
EOS (ABSOLUTE): 0.2 x10E3/uL (ref 0.0–0.4)
Eos: 4 %
Hematocrit: 37.5 % (ref 34.0–46.6)
Hemoglobin: 12.5 g/dL (ref 11.1–15.9)
Immature Grans (Abs): 0 x10E3/uL (ref 0.0–0.1)
Immature Granulocytes: 0 %
Lymphocytes Absolute: 1 x10E3/uL (ref 0.7–3.1)
Lymphs: 23 %
MCH: 31.6 pg (ref 26.6–33.0)
MCHC: 33.3 g/dL (ref 31.5–35.7)
MCV: 95 fL (ref 79–97)
Monocytes Absolute: 0.3 x10E3/uL (ref 0.1–0.9)
Monocytes: 6 %
Neutrophils Absolute: 3.1 x10E3/uL (ref 1.4–7.0)
Neutrophils: 67 %
Platelets: 197 x10E3/uL (ref 150–450)
RBC: 3.95 x10E6/uL (ref 3.77–5.28)
RDW: 12.8 % (ref 11.7–15.4)
WBC: 4.6 x10E3/uL (ref 3.4–10.8)

## 2024-08-05 LAB — VITAMIN B12: Vitamin B-12: 445 pg/mL (ref 232–1245)

## 2024-08-05 LAB — FERRITIN: Ferritin: 26 ng/mL (ref 15–150)

## 2024-08-05 LAB — VITAMIN D 25 HYDROXY (VIT D DEFICIENCY, FRACTURES): Vit D, 25-Hydroxy: 22.7 ng/mL — ABNORMAL LOW (ref 30.0–100.0)

## 2024-08-07 ENCOUNTER — Telehealth: Payer: Self-pay

## 2024-08-07 ENCOUNTER — Ambulatory Visit: Payer: Self-pay | Admitting: Family Medicine

## 2024-08-07 ENCOUNTER — Other Ambulatory Visit: Payer: Self-pay | Admitting: Family Medicine

## 2024-08-07 MED ORDER — VITAMIN D (ERGOCALCIFEROL) 1.25 MG (50000 UNIT) PO CAPS: 50000.0000 [IU] | ORAL_CAPSULE | ORAL | 1 refills | Status: AC

## 2024-08-07 NOTE — Telephone Encounter (Signed)
 Pt called for update on request, she is frustrated and is requesting correspondence today.

## 2024-08-07 NOTE — Telephone Encounter (Signed)
 Copied from CRM #[id number]. Topic: Clinical - Medication Question >> Aug 04, 2024  4:29 PM Myrick T wrote: Reason for CRM: patient called stated there was suppose to be a voucher sent to the [facility] on [address] for the Wegovy  cash pay script as they are asking her for $1K. Please f/u with patient when this has been done for her return to the pharmacy >> Aug 07, 2024  8:22 AM Montie POUR wrote: Ms. Demeter is calling back about the above coupon and I gave her this information from chart:  Deanna Channing LABOR, RPH to Janeice Comer HERO, [facility]   08/06/24  9:32 AM Yes, I will contact the pharmacy and provide a coupon card if they do not already have one and follow-up with the patient! She will call her pharmacy after 9 AM today

## 2024-08-07 NOTE — Telephone Encounter (Unsigned)
 Copied from CRM #[id number]. Topic: Clinical - Medication Refill >> Aug 07, 2024  8:23 AM Montie POUR wrote: Medication:  cyclobenzaprine  (FLEXERIL ) 10 MG tablet  Has the patient contacted their pharmacy? Yes (Agent: If no, request that the patient contact the pharmacy for the refill. If patient does not wish to contact the pharmacy document the reason why and proceed with request.) (Agent: If yes, when and what did the pharmacy advise?) Pharmacy needs order to refill  This is the patient's preferred pharmacy:  [facility] [address]), [place] - [address] [place]) [place] [address] Phone: [phone] Fax: [phone]  Is this the correct pharmacy for this prescription? Yes If no, delete pharmacy and type the correct one.   Has the prescription been filled recently? No  Is the patient out of the medication? No - She has a few left.   Has the patient been seen for an appointment in the last year OR does the patient have an upcoming appointment? Yes  Can we respond through MyChart? Yes  Agent: Please be advised that Rx refills may take up to 3 business days. We ask that you follow-up with your pharmacy.

## 2024-08-07 NOTE — Progress Notes (Signed)
" ° °  08/07/2024  Patient ID: Sandra Mendez, female   DOB: 24-Jan-1956, 69 y.o.   MRN: [id number]  In basket message from PCP as well as incoming call from patient to assist with affordability of Wegovy  oral tablet.  Patient was expecting self pay copay of $149, but pharmacy was charging over $1,000.  Patient does not qualify for manufacturer copay card based on having Medicare.  According to [facility] rep, if medication is sent to [facility] directly, she can get without insurance for the discounted cash prices.  Dr Vicci faxed an order today, and I will call the pharmacy tomorrow to verify order received and check patient's cost.  Sandra Mendez, PharmD, DPLA  "

## 2024-08-08 ENCOUNTER — Telehealth: Payer: Self-pay

## 2024-08-08 MED ORDER — CYCLOBENZAPRINE HCL 10 MG PO TABS: 10.0000 mg | ORAL_TABLET | Freq: Every day | ORAL | 0 refills | Status: AC

## 2024-08-08 NOTE — Progress Notes (Signed)
" ° °  08/08/2024  Patient ID: Sandra Mendez, female   DOB: 13-Jan-1956, 69 y.o.   MRN: [id number]  Prescriptions for WEGOVY  1.5mg  tablet and 4mg  tablet were faxed to [facility] yesterday.  Contacted the pharmacy, and prescriptions had not been received, so I gave verbal orders with Dr. Ferdie permission for:  WEGOVY  1.5mg  daily x30 days, and Wegovy  4mg  daily to start after completing 1 month of Wegovy  1.5mg  daily.  Pharmacist states they will contact the patient for consent to fill and get payment information.  They did state there is high demand currently, but the turn around time should just be a few days.  Copay for both doses will be $149/month through 11/12/24, and then the copay would go up to $199/month after this date per pharmacist.  Higher doses (9 and 25mg ) are $299/month per website.  Sending patient a MyChart message to make her aware and provide pharmacy phone number in case she does not hear from them by Friday.  Sandra Mendez, PharmD, DPLA  "

## 2024-08-08 NOTE — Telephone Encounter (Signed)
 See message in patients chart with Sandra Mendez regarding this.

## 2024-08-08 NOTE — Telephone Encounter (Unsigned)
 Copied from CRM [id number]. Topic: Clinical - Prescription Issue >> Aug 07, 2024  1:59 PM Avram MATSU wrote: Reason for CRM: patient stated the provider is suppose to send her a coupon for wegovy  and its suppose to be $149.  Please advise [phone]  [facility] [address]), [place] - [address]) [place] [address] Phone: [phone] Fax: [phone]

## 2024-08-08 NOTE — Telephone Encounter (Signed)
 Requested medication (s) are due for refill today: yes  Requested medication (s) are on the active medication list: yes  Last refill:  05/04/24  Future visit scheduled: yes  Notes to clinic:  Unable to refill per protocol, cannot delegate.      Requested Prescriptions  Pending Prescriptions Disp Refills   cyclobenzaprine  (FLEXERIL ) 10 MG tablet 30 tablet 0    Sig: Take 1 tablet (10 mg total) by mouth at bedtime. As needed     Not Delegated - Analgesics:  Muscle Relaxants Failed - 08/08/2024 11:11 AM      Failed - This refill cannot be delegated      Passed - Valid encounter within last 6 months    Recent Outpatient Visits           4 days ago Mixed hyperlipidemia   [facility] [place], Megan P, DO   3 months ago Obstructive sleep apnea   [facility] [place], Megan P, DO   5 months ago Mixed hyperlipidemia    [facility] [place], [place], DO

## 2024-08-10 ENCOUNTER — Ambulatory Visit: Admitting: Family Medicine

## 2024-08-15 ENCOUNTER — Telehealth: Payer: Self-pay | Admitting: Family Medicine

## 2024-08-15 NOTE — Telephone Encounter (Signed)
 Patient wants to know if the handicap placard is ready to be picked. Please advise.

## 2024-08-15 NOTE — Telephone Encounter (Signed)
 Currently pending with PCP

## 2024-08-16 NOTE — Telephone Encounter (Signed)
 Patient has been called and a message left for them to return the call to the office. Ok for E2C2 to review if/when they return the call. Please do not transfer to CAL rather send a CRM if needed only.   Please advise her that the handicap placard is ready for pickup. Our open office hours are Monday thru Friday 8:000 am to 12:00 pm and again 12:45 pm to 5:00 pm.

## 2024-08-23 ENCOUNTER — Ambulatory Visit (INDEPENDENT_AMBULATORY_CARE_PROVIDER_SITE_OTHER): Admitting: Internal Medicine

## 2024-08-23 ENCOUNTER — Ambulatory Visit: Payer: Self-pay | Admitting: *Deleted

## 2024-08-23 ENCOUNTER — Encounter: Payer: Self-pay | Admitting: Internal Medicine

## 2024-08-23 VITALS — BP 124/72 | Ht 65.0 in | Wt 218.8 lb

## 2024-08-23 DIAGNOSIS — N898 Other specified noninflammatory disorders of vagina: Secondary | ICD-10-CM | POA: Diagnosis not present

## 2024-08-23 MED ORDER — CLOBETASOL PROPIONATE 0.05 % EX OINT: 1.0000 | TOPICAL_OINTMENT | Freq: Two times a day (BID) | CUTANEOUS | 0 refills | Status: AC

## 2024-08-23 NOTE — Telephone Encounter (Signed)
 FYI Only or Action Required?: FYI only for provider: appointment scheduled on 1/28.  Patient was last seen in primary care on 08/04/2024 by Vicci Duwaine SQUIBB, DO.  Called Nurse Triage reporting No chief complaint on file..  Symptoms began about a month ago.  Interventions attempted: Rest, hydration, or home remedies.  Symptoms are: gradually worsening.  Triage Disposition: See HCP Within 4 Hours (Or PCP Triage)  Patient/caregiver understands and will follow disposition?: yes   Message from [facility] sent at 08/23/2024  8:03 AM EST  Reason for Triage: shingles .SABRA In the vagina.. burning itching painful..   Reason for Disposition  [1] SEVERE vaginal pain AND [2] not improved 2 hours after pain medicine  Answer Assessment - Initial Assessment Questions 1. SYMPTOM: What's the main symptom you're concerned about? (e.g., pain, itching, dryness)     Itching, blister, pain- burning 2. LOCATION: Where is the  pain located? (e.g., inside/outside, left/right)     Outside and inside of vagina 3. ONSET: When did the  itching/burning  start?     1 month 4. PAIN: Is there any pain? If Yes, ask: How bad is it? (Scale: 1-10; mild, moderate, severe)     Yes- 10/10 5. ITCHING: Is there any itching? If Yes, ask: How bad is it? (Scale: 1-10; mild, moderate, severe)     Yes-10/10 6. CAUSE: What do you think is causing the discharge? Have you had the same problem before? What happened then?     Shingles, never had this before 7. OTHER SYMPTOMS: Do you have any other symptoms? (e.g., fever, itching, vaginal bleeding, pain with urination, injury to genital area, vaginal foreign body)     itching  Protocols used: Vaginal Symptoms-A-AH

## 2024-08-23 NOTE — Progress Notes (Signed)
 "  Subjective:    Patient ID: Sandra Mendez, female    DOB: 1956/01/23, 69 y.o.   MRN: [id number]  HPI  Discussed the use of AI scribe software for clinical note transcription with the patient, who gave verbal consent to proceed.   Sandra Mendez is a 69 year old female who presents with persistent vaginal itching.  The itching began several months ago and has progressively worsened. Initially, a wet prep was performed and was negative for bacterial vaginosis and yeast infections. The itching is severe, with a rating of ten out of ten at its worst, and currently at a seven or eight. It is localized to the vaginal area, including around the clitoris, and is described as intense, feeling like 'clawing with needles'.  She notes the presence of a 'little white blister' observed during washing, although she is unsure if it is a blister. No sexual activity in the past three years following the passing of her husband, and previous testing for sexually transmitted infections was negative. No discharge accompanying the itching.   She has attempted to alleviate the itching with alcohol, which resulted in burning, but has not found relief.      Past Medical History:  Diagnosis Date   History of colon polyps    Obesity    Vitamin D  deficiency     Current Outpatient Medications  Medication Sig Dispense Refill   BLACK ELDERBERRY PO Take 4,000 mg by mouth daily at 2 PM.     cyclobenzaprine  (FLEXERIL ) 10 MG tablet Take 1 tablet (10 mg total) by mouth at bedtime. As needed 30 tablet 0   Multiple Vitamin (ONE-DAILY MULTI-VITAMIN PO) Take by mouth.     semaglutide -weight management (WEGOVY ) 1.5 MG tablet Take 1.5 mg by mouth daily. Daily in AM on an empty stomach with 4 oz of water. Do not eat or drink for 30 minutes after dose.     semaglutide -weight management (WEGOVY ) 4 MG tablet Take 4 mg by mouth daily. Daily in morning on an empty stomach with 4 oz of water. Do not eat or drink for 30 minutes  after dose.     UNABLE TO FIND Take 1 Scoop by mouth daily. Med Name: SP Complete Dietary Supplement     UNABLE TO FIND Take 2 capsules by mouth daily. Med Name: Gastric Fiber     UNABLE TO FIND Take 1 tablet by mouth daily at 2 PM. Med Name: Gymnema     UNABLE TO FIND Take 2 capsules by mouth 2 (two) times daily. Med Name: Diaplex     Vitamin D , Ergocalciferol , (DRISDOL ) 1.25 MG (50000 UNIT) CAPS capsule Take 1 capsule (50,000 Units total) by mouth every 7 (seven) days. 12 capsule 1   No current facility-administered medications for this visit.    Allergies[1]  Family History  Problem Relation Age of Onset   Diabetes Mother    Stroke Father    Diabetes Sister    Kidney disease Sister    Diabetes Sister    Colon cancer Sister    Diabetes Sister    Diabetes Sister    Diabetes Sister    Heart disease Sister    Breast cancer Other 82    Social History   Socioeconomic History   Marital status: Married    Spouse name: Not on file   Number of children: Not on file   Years of education: Not on file   Highest education level: 12th grade  Occupational History  Occupation: retired  Tobacco Use   Smoking status: Never   Smokeless tobacco: Never  Vaping Use   Vaping status: Never Used  Substance and Sexual Activity   Alcohol use: Yes    Alcohol/week: 0.0 standard drinks of alcohol    Comment: no alcohol in 24hrs, drinks beer occasional   Drug use: No   Sexual activity: Not Currently  Other Topics Concern   Not on file  Social History Narrative   Not on file   Social Drivers of Health   Tobacco Use: Low Risk (08/04/2024)   Patient History    Smoking Tobacco Use: Never    Smokeless Tobacco Use: Never    Passive Exposure: Not on file  Financial Resource Strain: Low Risk (05/08/2024)   Overall Financial Resource Strain (CARDIA)    Difficulty of Paying Living Expenses: Not hard at all  Food Insecurity: No Food Insecurity (05/08/2024)   Epic    Worried About Patent Examiner in the Last Year: Never true    Ran Out of Food in the Last Year: Never true  Transportation Needs: No Transportation Needs (05/08/2024)   Epic    Lack of Transportation (Medical): No    Lack of Transportation (Non-Medical): No  Physical Activity: Insufficiently Active (05/08/2024)   Exercise Vital Sign    Days of Exercise per Week: 5 days    Minutes of Exercise per Session: 20 min  Stress: Stress Concern Present (05/08/2024)   [employer] of Occupational Health - Occupational Stress Questionnaire    Feeling of Stress: To some extent  Social Connections: Moderately Integrated (05/08/2024)   Social Connection and Isolation Panel    Frequency of Communication with Friends and Family: More than three times a week    Frequency of Social Gatherings with Friends and Family: More than three times a week    Attends Religious Services: More than 4 times per year    Active Member of [employer] or Organizations: Yes    Attends Banker Meetings: More than 4 times per year    Marital Status: Widowed  Intimate Partner Violence: Not on file  Depression (PHQ2-9): Medium Risk (02/11/2024)   Depression (PHQ2-9)    PHQ-2 Score: 5  Alcohol Screen: Low Risk (05/08/2024)   Alcohol Screen    Last Alcohol Screening Score (AUDIT): 1  Housing: Unknown (05/26/2024)   Received from [facility]   Epic    Unable to Pay for Housing in the Last Year: Not on file    Number of Times Moved in the Last Year: Not on file    At any time in the past 12 months, were you homeless or living in a shelter (including now)?: No  Utilities: Not on file  Health Literacy: Not on file     Constitutional: Denies fever, malaise, fatigue, headache or abrupt weight changes.  Respiratory: Denies difficulty breathing, shortness of breath, cough or sputum production.   Cardiovascular: Denies chest pain, chest tightness, palpitations or swelling in the hands or feet.  Gastrointestinal: Denies  abdominal pain, bloating, constipation, diarrhea or blood in the stool.  GU: Patient reports vaginal itching.  Denies urgency, frequency, pain with urination, burning sensation, blood in urine, odor or discharge. Skin: Denies redness, rashes, lesions or ulcercations.   No other specific complaints in a complete review of systems (except as listed in HPI above).  Objective   BP 124/72 (BP Location: Left Arm, Patient Position: Sitting, Cuff Size: Large)   Ht 5' 5 (  1.651 m)   Wt 218 lb 12.8 oz (99.2 kg)   BMI 36.41 kg/m   Wt Readings from Last 3 Encounters:  08/04/24 219 lb 3.2 oz (99.4 kg)  06/06/24 213 lb (96.6 kg)  05/09/24 220 lb (99.8 kg)    General: Appears her stated age, obese, in NAD. Cardiovascular: Normal rate and rhythm.  Pulmonary/Chest: Normal effort. No respiratory distress.  Pelvic: Normal female anatomy.  No rashes, ulcerations or lesions noted.  Slightly hypopigmented skin noted in the area of concern for itching.   Neurological: Alert and oriented.   BMET    Component Value Date/Time   NA 142 08/04/2024 1451   NA 141 12/29/2011 0550   K 3.4 (L) 08/04/2024 1451   K 4.3 12/29/2011 0550   CL 109 (H) 08/04/2024 1451   CL 108 (H) 12/29/2011 0550   CO2 19 (L) 08/04/2024 1451   CO2 24 12/29/2011 0550   GLUCOSE 110 (H) 08/04/2024 1451   GLUCOSE 103 (H) 12/29/2011 0550   BUN 14 08/04/2024 1451   BUN 11 12/29/2011 0550   CREATININE 0.83 08/04/2024 1451   CREATININE 0.81 12/29/2011 0550   CALCIUM 8.7 08/04/2024 1451   CALCIUM 7.7 (L) 12/29/2011 0550   GFRNONAA 63 07/25/2020 0834   GFRNONAA >60 12/29/2011 0550   GFRAA 72 07/25/2020 0834   GFRAA >60 12/29/2011 0550    Lipid Panel     Component Value Date/Time   CHOL 257 (H) 08/04/2024 1451   TRIG 230 (H) 08/04/2024 1451   HDL 74 08/04/2024 1451   CHOLHDL 3.4 11/15/2017 0930   LDLCALC 142 (H) 08/04/2024 1451    CBC    Component Value Date/Time   WBC 4.6 08/04/2024 1451   WBC 4.6 01/12/2019 0000    RBC 3.95 08/04/2024 1451   RBC 4.17 01/12/2019 0000   HGB 12.5 08/04/2024 1451   HCT 37.5 08/04/2024 1451   HCT 38 01/12/2019 0000   PLT 197 08/04/2024 1451   MCV 95 08/04/2024 1451   MCV 91 01/12/2019 0000   MCH 31.6 08/04/2024 1451   MCH 30.0 01/12/2019 0000   MCHC 33.3 08/04/2024 1451   MCHC 33.1 01/12/2019 0000   RDW 12.8 08/04/2024 1451   RDW 13.1 01/12/2019 0000   LYMPHSABS 1.0 08/04/2024 1451   LYMPHSABS 1.7 04/18/2013 1231   MONOABS 0.4 04/18/2013 1231   EOSABS 0.2 08/04/2024 1451   EOSABS 0.2 04/18/2013 1231   BASOSABS 0.0 08/04/2024 1451   BASOSABS 0.0 04/18/2013 1231    Hgb A1C Lab Results  Component Value Date   HGBA1C 5.8 (H) 08/04/2024       Assessment and Plan   Assessment and Plan    Vaginal itching 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 Symptoms rated 7-8/10. Differential diagnosis includes shingles, but unlikely due to chronicity and lack of other symptoms. - Prescribed clobetasol  ointment 0.05% twice daily for two weeks, then once daily for two weeks, then as daily needed. - Discussed potential use of estrogen cream (Estrace) if symptoms persist or worsen, to be obtained through primary care provider.    Follow-up with your PCP as previously scheduled Angeline Laura, NP     [1]  Allergies Allergen Reactions   Erythromycin Rash   Mushroom Extract Complex (Obsolete)    Other Itching and Rash    Green peppers   Penicillins Rash   "

## 2024-08-23 NOTE — Patient Instructions (Signed)
 Irritation of the Vagina (Vaginitis): What to Know  Vaginitis is irritation and swelling of the vagina. It happens when the usual balance of bacteria and yeast in the vagina changes. This change causes some types to grow too much. This overgrowth leads to vaginitis. What are the causes? Bacteria. Yeast, which is a fungus. A parasite. A virus. Low hormones in the body. This can occur during pregnancy, breastfeeding, or after menopause. What increases the risk? Irritants, such as douches, bubble baths, scented tampons, and feminine sprays. Antibiotics. Poor hygiene. Wearing tight pants or thong underwear. Some birth control methods, such as diaphragms, vaginal sponges, or spermicides. Having sex without a condom or having sex with more than one person. Infections. Uncontrolled diabetes. What are the signs or symptoms? Abnormal fluid from the vagina. The fluid may be: White, gray, or yellow. Thick, white, and cheesy. Frothy and yellow or green. A bad smell from the vagina. Itching, pain, or swelling in the vagina. Pain during sex. Pain or burning when you pee. How is this diagnosed? This condition is diagnosed based on your symptoms, medical history, and an exam. This may include a pelvic exam. Tests may also be done. Tests may be done to: Check the pH level of your vagina. Check the fluid in your vagina. How is this treated? Treatment will depend on what is causing your vaginitis. Treatment may include: Antibiotics. Antifungal medicines. Medicines to treat symptoms if you have a virus. Your sex partner should also be treated. Estrogen medicines. Medicines to treat allergies. The medicines may be pills or creams. Follow these instructions at home: Lifestyle Keep the area around your vagina clean and dry. Avoid using soap. Rinse the area with water. Until your health care provider says it's okay: Do not douche. Do not use tampons. Use pads, if needed. Do not have sex. Wipe  from front to back after going to the bathroom. When the provider says it's okay, practice safe sex. Use condoms. General instructions Take your medicines only as told. If you were given antibiotics, take them as told. Do not stop taking them even if you start to feel better. How is this prevented? Use mild, unscented products. Avoid the following products if they are scented: Sprays. Detergents. Tampons. Products for cleaning the vagina. Soaps or bubble baths. Let air reach your genital area. To do this: Wear cotton underwear. Do not wear underwear while you sleep. Do not wear tight pants and underwear or pantyhose without a cotton panel. Do not wear thong underwear. Take off any wet clothing, such as bathing suits, as soon as possible. Practice safe sex. Use condoms. Contact a health care provider if: You have pain in the belly or around the pelvis. You have a fever or chills. You have symptoms that last for more than 2-3 days. This information is not intended to replace advice given to you by your health care provider. Make sure you discuss any questions you have with your health care provider. Document Revised: 04/15/2023 Document Reviewed: 11/23/2022 Elsevier Patient Education  2024 [employer].

## 2024-08-31 ENCOUNTER — Telehealth: Payer: Self-pay

## 2024-08-31 NOTE — Progress Notes (Signed)
" ° °  08/31/2024  Patient ID: Sandra Mendez, female   DOB: [birth year], 69 y.o.   MRN: [id number]  Patient inquiring about recently prescribed clobetasol  0.05% ointment for chronic vulvar itching and hyperpigmentation consistent with lichen sclerosus, likely due to estrogen deficiency.  Provider recommended applying BID x7 days, then once daily x7 days, then PRN.  Patient concerned with increased appetite.  Advised that some systemic absorption is likely, but since medication will not likely be used regularly long-term, I would not expect continued effect on appetite.  She is using Wegovy  oral tablets 1.5mg  daily and has lost 7lbs so far; she will soon increase to 4mg  tablets daily.  Provider she saw recently for vulvar itching mentioned potential use of Estrace if symptoms persist and advised to follow-up with her PCP for this.  Patient sees Dr. Vicci again 2/20.  [name], PharmD, DPLA  "

## 2024-09-15 ENCOUNTER — Ambulatory Visit: Admitting: Family Medicine
